# Patient Record
Sex: Male | Born: 1980
Health system: Southern US, Community
[De-identification: ages and names within clinical notes are randomized; demographics above are authoritative.]

## PROBLEM LIST (undated history)

## (undated) DIAGNOSIS — K219 Gastro-esophageal reflux disease without esophagitis: Secondary | ICD-10-CM

## (undated) DIAGNOSIS — G610 Guillain-Barre syndrome: Secondary | ICD-10-CM

## (undated) DIAGNOSIS — T300 Burn of unspecified body region, unspecified degree: Secondary | ICD-10-CM

## (undated) DIAGNOSIS — B20 Human immunodeficiency virus [HIV] disease: Secondary | ICD-10-CM

## (undated) HISTORY — PX: SKIN GRAFT: SHX250

## (undated) HISTORY — DX: Gastro-esophageal reflux disease without esophagitis: K21.9

## (undated) NOTE — *Deleted (*Deleted)
PROGRESS NOTE    Travis Mcgee  AVW:[id number] DOB: 1981/01/06 DOA: 07/28/2020 PCP: Patient, No Pcp Per   Brief Narrative:   ***   Assessment & Plan:  ***  DVT prophylaxis: *** Code Status: *** Family Communication: ***   Status is: Inpatient  {Inpatient:23812}  Dispo:  Patient From:    Planned Disposition: To be determined  Expected discharge date: 08/19/20  Medically stable for discharge:         Consultants:   ***  Procedures:   ***  Antimicrobials:  . ***   ROS:  *** . Remainder ROS is negative for all not previously mentioned.  Subjective: ***  Objective: Vitals:   08/17/20 1612 08/17/20 1930 08/17/20 2349 08/18/20 0743  BP: 123/80 113/79 116/71 108/74  Pulse: (!) 58 70 96 87  Resp: 16 18 17    Temp: 98.5 F (36.9 C) 98.6 F (37 C) 98.3 F (36.8 C) 98 F (36.7 C)  TempSrc: Oral Oral Oral Oral  SpO2: 95% 98% 100% 100%  Weight:      Height:        Intake/Output Summary (Last 24 hours) at 08/18/2020 1229 Last data filed at 08/18/2020 8295 Gross per 24 hour  Intake 360 ml  Output 600 ml  Net -240 ml   Filed Weights   07/28/20 1352 08/16/20 0308  Weight: 65.8 kg 71.7 kg    Examination:  General: 65 y.o. male resting in bed in NAD Eyes: PERRL, normal sclera ENMT: Nares patent w/o discharge, orophaynx clear, dentition normal, ears w/o discharge/lesions/ulcers Neck: Supple, trachea midline Cardiovascular: RRR, +S1, S2, no m/g/r, equal pulses throughout Respiratory: CTABL, no w/r/r, normal WOB GI: BS+, NDNT, no masses noted, no organomegaly noted MSK: No e/c/c Skin: No rashes, bruises, ulcerations noted Neuro: A&O x 3, no focal deficits Psyc: Appropriate interaction and affect, calm/cooperative   Data Reviewed: I have personally reviewed following labs and imaging studies.  CBC: Recent Labs  Lab 08/14/20 1124  WBC 8.4  NEUTROABS 3.1  HGB 12.8*  HCT 38.7*  MCV 105.7*  PLT 358   Basic Metabolic Panel: Recent Labs  Lab  08/14/20 1124  NA 136  K 3.5  CL 101  CO2 25  GLUCOSE 113*  BUN 9  CREATININE 0.71  CALCIUM 9.6   GFR: Estimated Creatinine Clearance: 124.5 mL/min (by C-G formula based on SCr of 0.71 mg/dL). Liver Function Tests: Recent Labs  Lab 08/14/20 1124  AST 37  ALT 50*  ALKPHOS 151*  BILITOT 0.4  PROT 9.9*  ALBUMIN 3.3*   No results for input(s): LIPASE, AMYLASE in the last 168 hours. No results for input(s): AMMONIA in the last 168 hours. Coagulation Profile: No results for input(s): INR, PROTIME in the last 168 hours. Cardiac Enzymes: No results for input(s): CKTOTAL, CKMB, CKMBINDEX, TROPONINI in the last 168 hours. BNP (last 3 results) No results for input(s): PROBNP in the last 8760 hours. HbA1C: No results for input(s): HGBA1C in the last 72 hours. CBG: No results for input(s): GLUCAP in the last 168 hours. Lipid Profile: No results for input(s): CHOL, HDL, LDLCALC, TRIG, CHOLHDL, LDLDIRECT in the last 72 hours. Thyroid Function Tests: No results for input(s): TSH, T4TOTAL, FREET4, T3FREE, THYROIDAB in the last 72 hours. Anemia Panel: No results for input(s): VITAMINB12, FOLATE, FERRITIN, TIBC, IRON, RETICCTPCT in the last 72 hours. Sepsis Labs: No results for input(s): PROCALCITON, LATICACIDVEN in the last 168 hours.  Recent Results (from the past 240 hour(s))  Aerobic/Anaerobic Culture (surgical/deep wound)  Status: None (Preliminary result)   Collection Time: 08/14/20  4:08 PM   Specimen: Wound; Abscess  Result Value Ref Range Status   Specimen Description WOUND LEFT AXILLA  Final   Special Requests Immunocompromised  Final   Gram Stain   Final    ABUNDANT WBC PRESENT, PREDOMINANTLY PMN FEW GRAM POSITIVE COCCI Performed at [facility], [address], [place], [place] [address]    Culture   Final    MODERATE STAPHYLOCOCCUS LUGDUNENSIS NO ANAEROBES ISOLATED; CULTURE IN PROGRESS FOR 5 DAYS    Report Status PENDING  Incomplete   Organism ID,  Bacteria STAPHYLOCOCCUS LUGDUNENSIS  Final      Susceptibility   Staphylococcus lugdunensis - MIC*    CIPROFLOXACIN <=0.5 SENSITIVE Sensitive     ERYTHROMYCIN >=8 RESISTANT Resistant     GENTAMICIN <=0.5 SENSITIVE Sensitive     OXACILLIN 0.5 SENSITIVE Sensitive     TETRACYCLINE <=1 SENSITIVE Sensitive     VANCOMYCIN <=0.5 SENSITIVE Sensitive     TRIMETH/SULFA <=10 SENSITIVE Sensitive     CLINDAMYCIN >=8 RESISTANT Resistant     RIFAMPIN <=0.5 SENSITIVE Sensitive     Inducible Clindamycin NEGATIVE Sensitive     * MODERATE STAPHYLOCOCCUS LUGDUNENSIS      Radiology Studies: No results found.   Scheduled Meds: . amoxicillin-clavulanate  1 tablet Oral Q12H  . bictegravir-emtricitabine-tenofovir AF  1 tablet Oral Daily  . DULoxetine  20 mg Oral Daily  . enoxaparin (LOVENOX) injection  40 mg Subcutaneous Q24H  . feeding supplement  237 mL Oral Q24H  . gabapentin  900 mg Oral TID  . levothyroxine  25 mcg Oral Q0600  . methocarbamol  500 mg Oral TID  . multivitamin with minerals  1 tablet Oral Daily  . sulfamethoxazole-trimethoprim  1 tablet Oral Daily  . thiamine injection  100 mg Intravenous Daily   Or  . thiamine  100 mg Oral Daily  . vitamin B-12  1,000 mcg Oral Daily   Continuous Infusions:   LOS: 21 days    Time spent: 35 minutes spent in coordination of care today.    Teddy Spike, DO [facility]  If 7PM-7AM, please contact night-coverage [email or web address] 08/18/2020, 12:29 PM

---

## 2017-01-07 ENCOUNTER — Encounter (HOSPITAL_COMMUNITY): Payer: Self-pay | Admitting: Emergency Medicine

## 2017-01-07 ENCOUNTER — Emergency Department (HOSPITAL_COMMUNITY)
Admission: EM | Admit: 2017-01-07 | Discharge: 2017-01-07 | Disposition: A | Discharge status: Home / Self Care | Payer: Self-pay | Attending: Emergency Medicine | Admitting: Emergency Medicine

## 2017-01-07 DIAGNOSIS — F17210 Nicotine dependence, cigarettes, uncomplicated: Secondary | ICD-10-CM | POA: Insufficient documentation

## 2017-01-07 DIAGNOSIS — N3000 Acute cystitis without hematuria: Secondary | ICD-10-CM | POA: Insufficient documentation

## 2017-01-07 DIAGNOSIS — Z202 Contact with and (suspected) exposure to infections with a predominantly sexual mode of transmission: Secondary | ICD-10-CM | POA: Insufficient documentation

## 2017-01-07 DIAGNOSIS — K644 Residual hemorrhoidal skin tags: Secondary | ICD-10-CM | POA: Insufficient documentation

## 2017-01-07 DIAGNOSIS — Z79899 Other long term (current) drug therapy: Secondary | ICD-10-CM | POA: Insufficient documentation

## 2017-01-07 HISTORY — DX: Burn of unspecified body region, unspecified degree: T30.0

## 2017-01-07 HISTORY — DX: Human immunodeficiency virus (HIV) disease: B20

## 2017-01-07 LAB — URINALYSIS, ROUTINE W REFLEX MICROSCOPIC
BACTERIA UA: NONE SEEN
Bilirubin Urine: NEGATIVE
Glucose, UA: NEGATIVE mg/dL
HGB URINE DIPSTICK: NEGATIVE
Ketones, ur: 5 mg/dL — AB
NITRITE: NEGATIVE
PH: 6 (ref 5.0–8.0)
Protein, ur: 100 mg/dL — AB
SPECIFIC GRAVITY, URINE: 1.03 (ref 1.005–1.030)
Squamous Epithelial / HPF: NONE SEEN

## 2017-01-07 MED ORDER — HYDROCORTISONE 2.5 % RE CREA: TOPICAL_CREAM | RECTAL | 0 refills | Status: DC

## 2017-01-07 MED ORDER — CEPHALEXIN 500 MG PO CAPS: 500.0000 mg | ORAL_CAPSULE | Freq: Two times a day (BID) | ORAL | 0 refills | Status: AC

## 2017-01-07 MED ORDER — STERILE WATER FOR INJECTION IJ SOLN
INTRAMUSCULAR | Status: AC
  Administered 2017-01-07: 10 mL
  Filled 2017-01-07: qty 10

## 2017-01-07 MED ORDER — METRONIDAZOLE 500 MG PO TABS
2000.0000 mg | ORAL_TABLET | Freq: Once | ORAL | Status: AC
  Administered 2017-01-07: 2000 mg via ORAL
  Filled 2017-01-07: qty 4

## 2017-01-07 MED ORDER — DOXYCYCLINE HYCLATE 100 MG PO CAPS: 100.0000 mg | ORAL_CAPSULE | Freq: Two times a day (BID) | ORAL | 0 refills | Status: DC

## 2017-01-07 MED ORDER — AZITHROMYCIN 250 MG PO TABS
1000.0000 mg | ORAL_TABLET | Freq: Once | ORAL | Status: AC
  Administered 2017-01-07: 1000 mg via ORAL
  Filled 2017-01-07: qty 4

## 2017-01-07 MED ORDER — CEFTRIAXONE SODIUM 250 MG IJ SOLR
250.0000 mg | Freq: Once | INTRAMUSCULAR | Status: AC
  Administered 2017-01-07: 250 mg via INTRAMUSCULAR
  Filled 2017-01-07: qty 250
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
## 2017-01-07 NOTE — Discharge Instructions (Signed)
Please take Doxycycline 2 times a day for 10 days. Please take Keflex 2 times a day for 7 days. Use anusol as needed twice daily. Continue sitz baths at home. Also high fiber diet and metamucil over the counter.  Make sure to go to HIV clinic to be followed and started on HIV medication. This is very important for both you and your partner.  Follow up with general surgery to have hemorrhoid evaluated.    Contact a health care provider if: See your health care provider. Tell your sexual partner(s). They should be tested and treated for any STDs. Do not have sex until your health care provider says it is okay. Get help right away if: Contact your health care provider right away if: You have severe abdominal pain. You are a man and notice swelling or pain in your testicles.

## 2017-01-07 NOTE — ED Notes (Signed)
Pt presents with a yellow penile discharge that started a few days ago.  Also complains of rectal pain due to an external hemorrhoid.  Denies bleeding. Denies any other complaints.

## 2017-01-08 LAB — HIV 1/2 AB DIFFERENTIATION
HIV 1 AB: POSITIVE — AB
HIV 2 AB: UNDETERMINED

## 2017-01-08 LAB — RPR, QUANT+TP ABS (REFLEX)
Rapid Plasma Reagin, Quant: 1:4 {titer} — ABNORMAL HIGH
T Pallidum Abs: POSITIVE — AB

## 2017-01-08 LAB — HIV ANTIBODY (ROUTINE TESTING W REFLEX): HIV Screen 4th Generation wRfx: REACTIVE — AB

## 2017-01-08 LAB — RPR: RPR: REACTIVE — AB

## 2017-01-09 LAB — URINE CULTURE: CULTURE: NO GROWTH

## 2017-01-10 LAB — GC/CHLAMYDIA PROBE AMP (~~LOC~~) NOT AT ARMC
CHLAMYDIA, DNA PROBE: NEGATIVE
Neisseria Gonorrhea: POSITIVE — AB

## 2017-06-12 ENCOUNTER — Ambulatory Visit: Payer: No Typology Code available for payment source

## 2017-06-12 ENCOUNTER — Ambulatory Visit: Payer: No Typology Code available for payment source | Admitting: Infectious Diseases

## 2017-06-12 ENCOUNTER — Other Ambulatory Visit: Payer: No Typology Code available for payment source

## 2017-07-17 ENCOUNTER — Ambulatory Visit: Payer: No Typology Code available for payment source | Admitting: Infectious Diseases

## 2017-11-27 ENCOUNTER — Other Ambulatory Visit: Payer: No Typology Code available for payment source

## 2017-11-27 ENCOUNTER — Other Ambulatory Visit: Payer: Self-pay | Admitting: *Deleted

## 2017-11-27 DIAGNOSIS — B20 Human immunodeficiency virus [HIV] disease: Secondary | ICD-10-CM

## 2017-11-27 DIAGNOSIS — Z113 Encounter for screening for infections with a predominantly sexual mode of transmission: Secondary | ICD-10-CM

## 2017-11-27 DIAGNOSIS — Z79899 Other long term (current) drug therapy: Secondary | ICD-10-CM

## 2017-11-29 ENCOUNTER — Other Ambulatory Visit: Payer: Self-pay

## 2017-11-29 ENCOUNTER — Encounter (HOSPITAL_COMMUNITY): Payer: Self-pay | Admitting: Emergency Medicine

## 2017-11-29 ENCOUNTER — Inpatient Hospital Stay (HOSPITAL_COMMUNITY)
Admission: EM | Admit: 2017-11-29 | Discharge: 2017-12-01 | DRG: 977 | Disposition: A | Discharge status: Home / Self Care | Payer: Non-veteran care | Attending: Internal Medicine | Admitting: Internal Medicine

## 2017-11-29 ENCOUNTER — Emergency Department (HOSPITAL_COMMUNITY): Payer: Non-veteran care

## 2017-11-29 DIAGNOSIS — Z87828 Personal history of other (healed) physical injury and trauma: Secondary | ICD-10-CM

## 2017-11-29 DIAGNOSIS — Z79899 Other long term (current) drug therapy: Secondary | ICD-10-CM

## 2017-11-29 DIAGNOSIS — Z8619 Personal history of other infectious and parasitic diseases: Secondary | ICD-10-CM | POA: Diagnosis present

## 2017-11-29 DIAGNOSIS — E43 Unspecified severe protein-calorie malnutrition: Secondary | ICD-10-CM | POA: Diagnosis present

## 2017-11-29 DIAGNOSIS — B20 Human immunodeficiency virus [HIV] disease: Secondary | ICD-10-CM | POA: Diagnosis not present

## 2017-11-29 DIAGNOSIS — R634 Abnormal weight loss: Secondary | ICD-10-CM | POA: Diagnosis present

## 2017-11-29 DIAGNOSIS — R933 Abnormal findings on diagnostic imaging of other parts of digestive tract: Secondary | ICD-10-CM | POA: Diagnosis present

## 2017-11-29 DIAGNOSIS — K2970 Gastritis, unspecified, without bleeding: Secondary | ICD-10-CM | POA: Diagnosis present

## 2017-11-29 DIAGNOSIS — Z681 Body mass index (BMI) 19 or less, adult: Secondary | ICD-10-CM

## 2017-11-29 DIAGNOSIS — R1310 Dysphagia, unspecified: Secondary | ICD-10-CM

## 2017-11-29 DIAGNOSIS — R0902 Hypoxemia: Secondary | ICD-10-CM | POA: Diagnosis not present

## 2017-11-29 DIAGNOSIS — K228 Other specified diseases of esophagus: Secondary | ICD-10-CM | POA: Diagnosis present

## 2017-11-29 DIAGNOSIS — K449 Diaphragmatic hernia without obstruction or gangrene: Secondary | ICD-10-CM | POA: Diagnosis present

## 2017-11-29 DIAGNOSIS — K089 Disorder of teeth and supporting structures, unspecified: Secondary | ICD-10-CM

## 2017-11-29 DIAGNOSIS — D649 Anemia, unspecified: Secondary | ICD-10-CM | POA: Diagnosis present

## 2017-11-29 DIAGNOSIS — F17210 Nicotine dependence, cigarettes, uncomplicated: Secondary | ICD-10-CM | POA: Diagnosis present

## 2017-11-29 DIAGNOSIS — Z9114 Patient's other noncompliance with medication regimen: Secondary | ICD-10-CM

## 2017-11-29 DIAGNOSIS — K3189 Other diseases of stomach and duodenum: Secondary | ICD-10-CM | POA: Diagnosis present

## 2017-11-29 DIAGNOSIS — R000 Tachycardia, unspecified: Secondary | ICD-10-CM | POA: Diagnosis present

## 2017-11-29 DIAGNOSIS — E871 Hypo-osmolality and hyponatremia: Secondary | ICD-10-CM | POA: Diagnosis present

## 2017-11-29 LAB — URINALYSIS, ROUTINE W REFLEX MICROSCOPIC
BACTERIA UA: NONE SEEN
Glucose, UA: NEGATIVE mg/dL
Hgb urine dipstick: NEGATIVE
Ketones, ur: NEGATIVE mg/dL
Leukocytes, UA: NEGATIVE
Nitrite: NEGATIVE
PH: 5 (ref 5.0–8.0)
Protein, ur: 100 mg/dL — AB
SPECIFIC GRAVITY, URINE: 1.03 (ref 1.005–1.030)

## 2017-11-29 LAB — CBC WITH DIFFERENTIAL/PLATELET
BASOS ABS: 0 10*3/uL (ref 0.0–0.1)
Basophils Relative: 0 %
EOS PCT: 1 %
Eosinophils Absolute: 0.1 10*3/uL (ref 0.0–0.7)
HCT: 38.8 % — ABNORMAL LOW (ref 39.0–52.0)
Hemoglobin: 13.5 g/dL (ref 13.0–17.0)
LYMPHS ABS: 0.9 10*3/uL (ref 0.7–4.0)
LYMPHS PCT: 17 %
MCH: 33.1 pg (ref 26.0–34.0)
MCHC: 34.8 g/dL (ref 30.0–36.0)
MCV: 95.1 fL (ref 78.0–100.0)
Monocytes Absolute: 0.8 10*3/uL (ref 0.1–1.0)
Monocytes Relative: 15 %
Neutro Abs: 3.3 10*3/uL (ref 1.7–7.7)
Neutrophils Relative %: 67 %
PLATELETS: 223 10*3/uL (ref 150–400)
RBC: 4.08 MIL/uL — ABNORMAL LOW (ref 4.22–5.81)
RDW: 12.2 % (ref 11.5–15.5)
WBC: 5 10*3/uL (ref 4.0–10.5)

## 2017-11-29 LAB — COMPREHENSIVE METABOLIC PANEL
ALBUMIN: 2.9 g/dL — AB (ref 3.5–5.0)
ALK PHOS: 120 U/L (ref 38–126)
ALT: 19 U/L (ref 17–63)
AST: 35 U/L (ref 15–41)
Anion gap: 7 (ref 5–15)
BILIRUBIN TOTAL: 0.5 mg/dL (ref 0.3–1.2)
BUN: 8 mg/dL (ref 6–20)
CALCIUM: 8.5 mg/dL — AB (ref 8.9–10.3)
CO2: 29 mmol/L (ref 22–32)
Chloride: 95 mmol/L — ABNORMAL LOW (ref 101–111)
Creatinine, Ser: 0.82 mg/dL (ref 0.61–1.24)
GFR calc Af Amer: >60 mL/min (ref >60)
GFR calc non Af Amer: >60 mL/min (ref >60)
GLUCOSE: 91 mg/dL (ref 65–99)
POTASSIUM: 3.6 mmol/L (ref 3.5–5.1)
SODIUM: 131 mmol/L — AB (ref 135–145)
TOTAL PROTEIN: 8.8 g/dL — AB (ref 6.5–8.1)

## 2017-11-29 LAB — PHOSPHORUS: Phosphorus: 3.1 mg/dL (ref 2.5–4.6)

## 2017-11-29 LAB — MAGNESIUM: Magnesium: 2.1 mg/dL (ref 1.7–2.4)

## 2017-11-29 LAB — LACTATE DEHYDROGENASE: LDH: 159 U/L (ref 98–192)

## 2017-11-29 IMAGING — CT CT ANGIO CHEST
3 of 10 series · 17 of 37 positions shown · IV contrast (ISOVUE)
Comparison: None.

CLINICAL DATA: Increased difficulty swallowing over 3 weeks. Unable
to swallow food.

EXAM:
CT ANGIOGRAPHY CHEST WITH CONTRAST
TECHNIQUE: Multidetector CT imaging of the chest was performed using the
standard protocol during bolus administration of intravenous
contrast. Multiplanar CT image reconstructions and MIPs were
obtained to evaluate the vascular anatomy.
CONTRAST:  100mL [34] IOPAMIDOL ([34]) INJECTION 76%

[Series 5: thins · axial · 0.62mm/px · z∈[-520,-240]mm · 11 of 336 slices shown]
[im 28/336  lung]
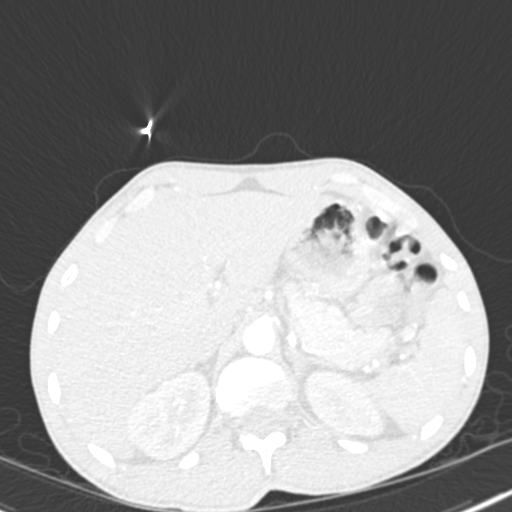
[im 56/336  mediastinal]
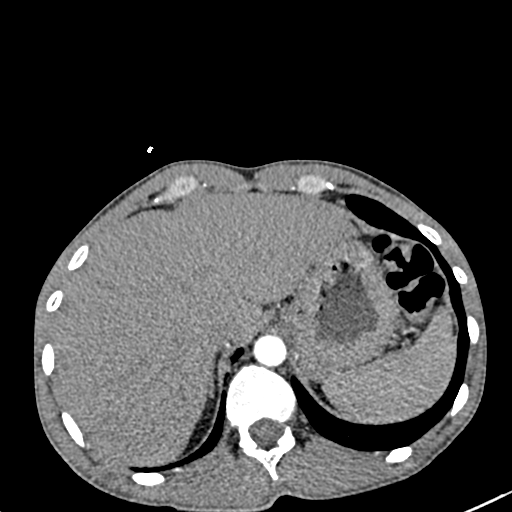
[im 84/336  lung]
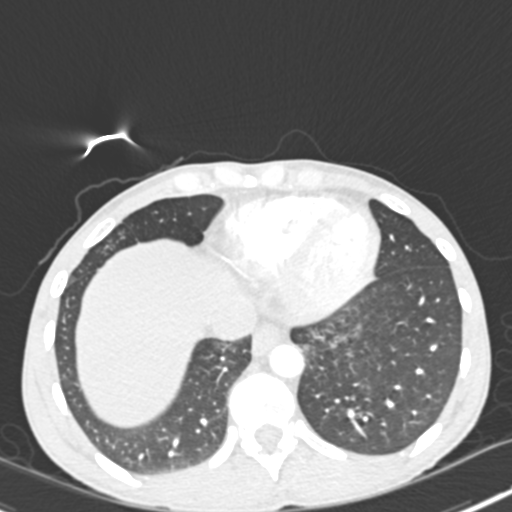
[im 112/336  mediastinal]
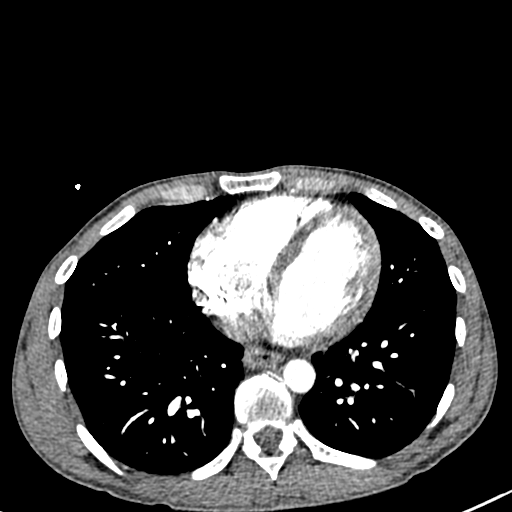
[im 140/336  lung]
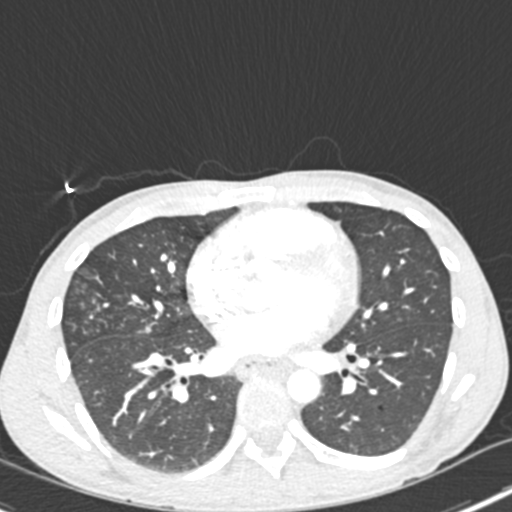
[im 168/336  mediastinal]
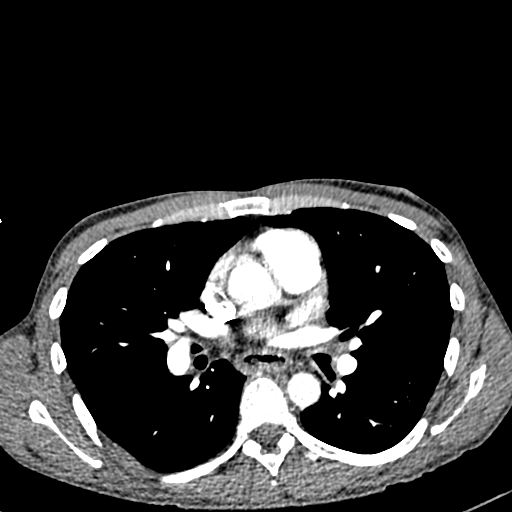
[im 196/336  lung]
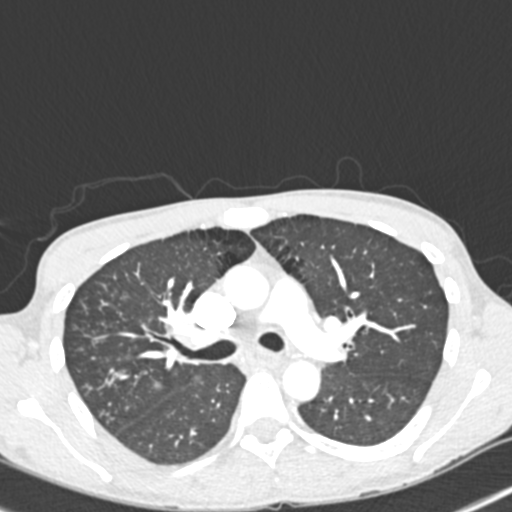
[im 224/336  mediastinal]
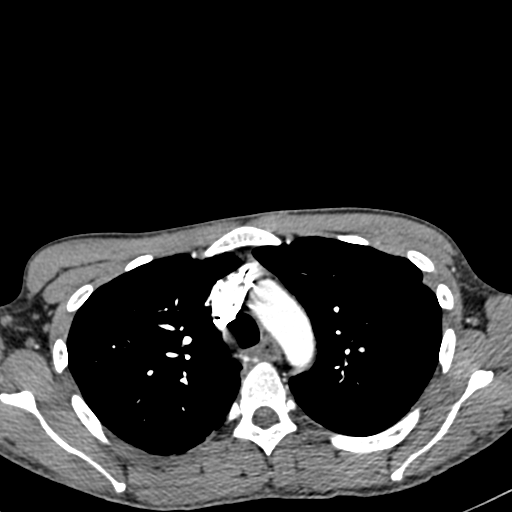
[im 252/336  lung]
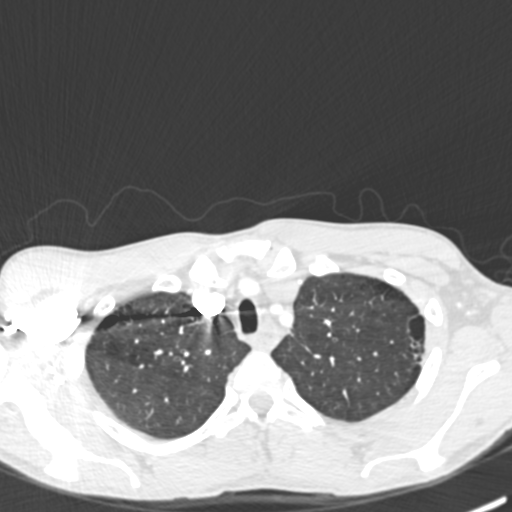
[im 280/336  mediastinal]
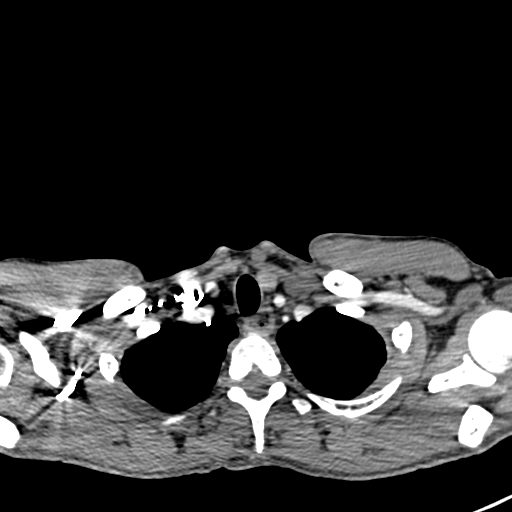
[im 308/336  lung]
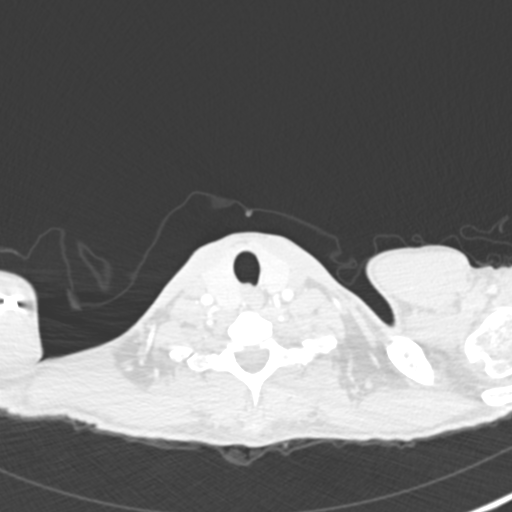

[Series 11: axial neck · axial · 0.44mm/px · z∈[-266,-130]mm · 3 of 137 slices shown]
[im 35/137  lung]
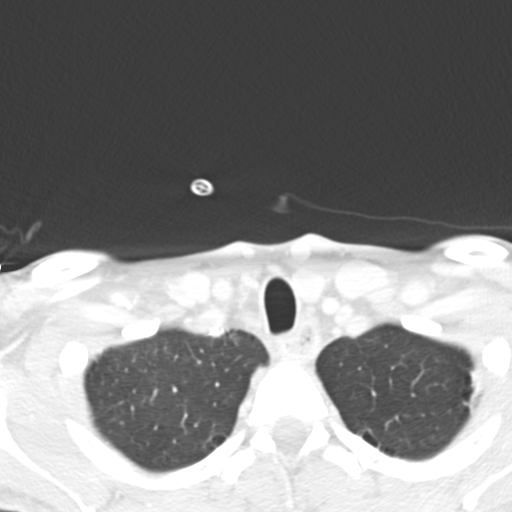
[im 69/137  lung]
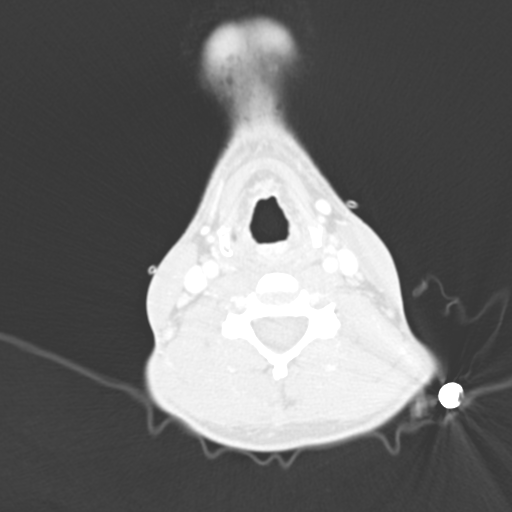
[im 103/137  lung]
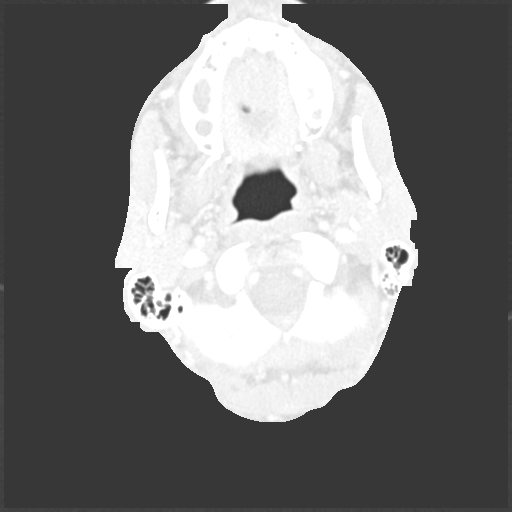

[Series 15: orthogonal ax · axial · 0.39mm/px · z∈[-287,-150]mm · 3 of 142 slices shown]
[im 36/142  lung]
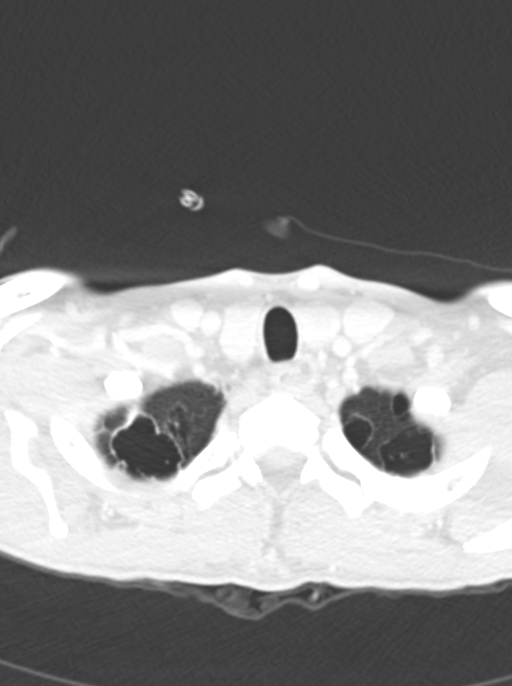
[im 71/142  lung]
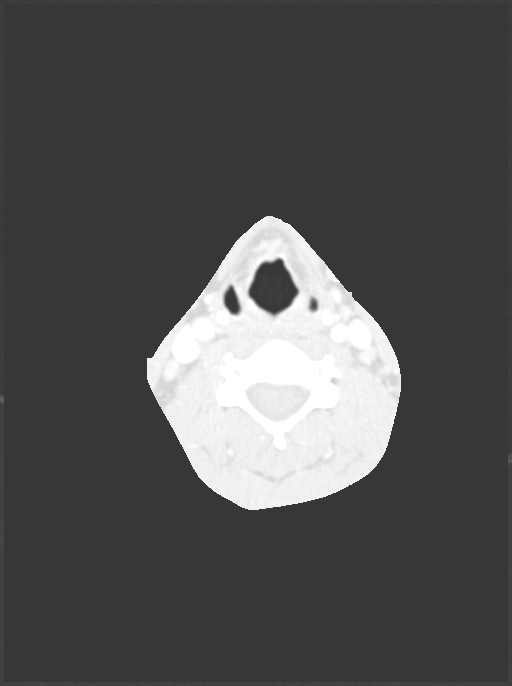
[im 106/142  lung]
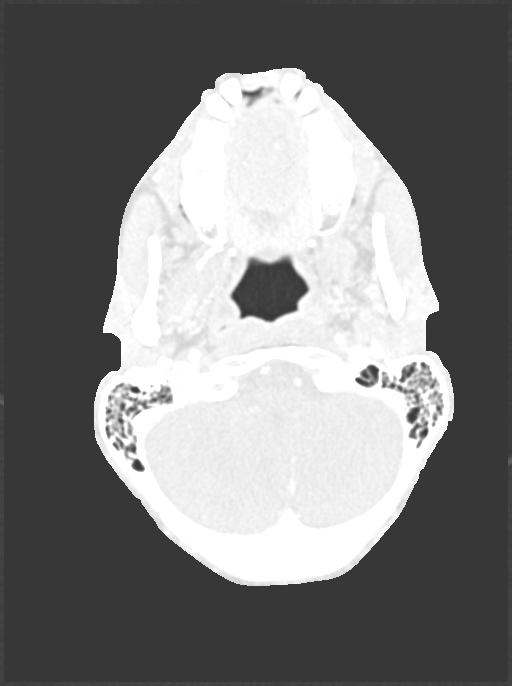

[17 of 37 positions shown; findings below may reference images not displayed]

FINDINGS: Cardiovascular: Satisfactory opacification of the pulmonary arteries
to the segmental level. No evidence of pulmonary embolism. Normal
heart size. No pericardial effusion. Thoracic aorta is normal in
caliber. No thoracic aortic dissection.

Mediastinum/Nodes: No enlarged mediastinal, hilar, or axillary lymph
nodes. Thyroid gland and trachea demonstrate no significant
findings. Diffuse esophageal wall thickening with fluid mildly
distending the esophagus as can be seen with esophagitis with
possible distal esophageal stricture.

Lungs/Pleura: No pleural effusion or pneumothorax. Multiple
ground-glass pulmonary nodules and reticular thickening in the right
upper lobe, right middle lobe and left lower lobe concerning for an
infectious or inflammatory etiology including atypical infection
such as FERIENHAUS. Multiple smaller blebs along the left lateral chest
wall.

Upper Abdomen: No acute upper abdominal abnormality.

Musculoskeletal: No acute osseous abnormality.

Review of the MIP images confirms the above findings.
IMPRESSION: 1. No evidence pulmonary embolus.
2. Diffuse esophageal wall thickening with fluid mildly distending
the esophagus as can be seen with esophagitis with possible distal
esophageal stricture.
3. Normal thoracic aorta without dissection.
4. Multiple ground-glass pulmonary nodules and reticular thickening
in the right upper lobe, right middle lobe and left lower lobe
concerning for an infectious or inflammatory etiology including
atypical infection such as FERIENHAUS.

## 2017-11-29 IMAGING — CR DG CHEST 2V
2 series · 2 of 2 positions shown · non-contrast
Comparison: None.

CLINICAL DATA: Diarrhea for 3 weeks, decreased appetite

EXAM:
CHEST  2 VIEW

[w chest pa]
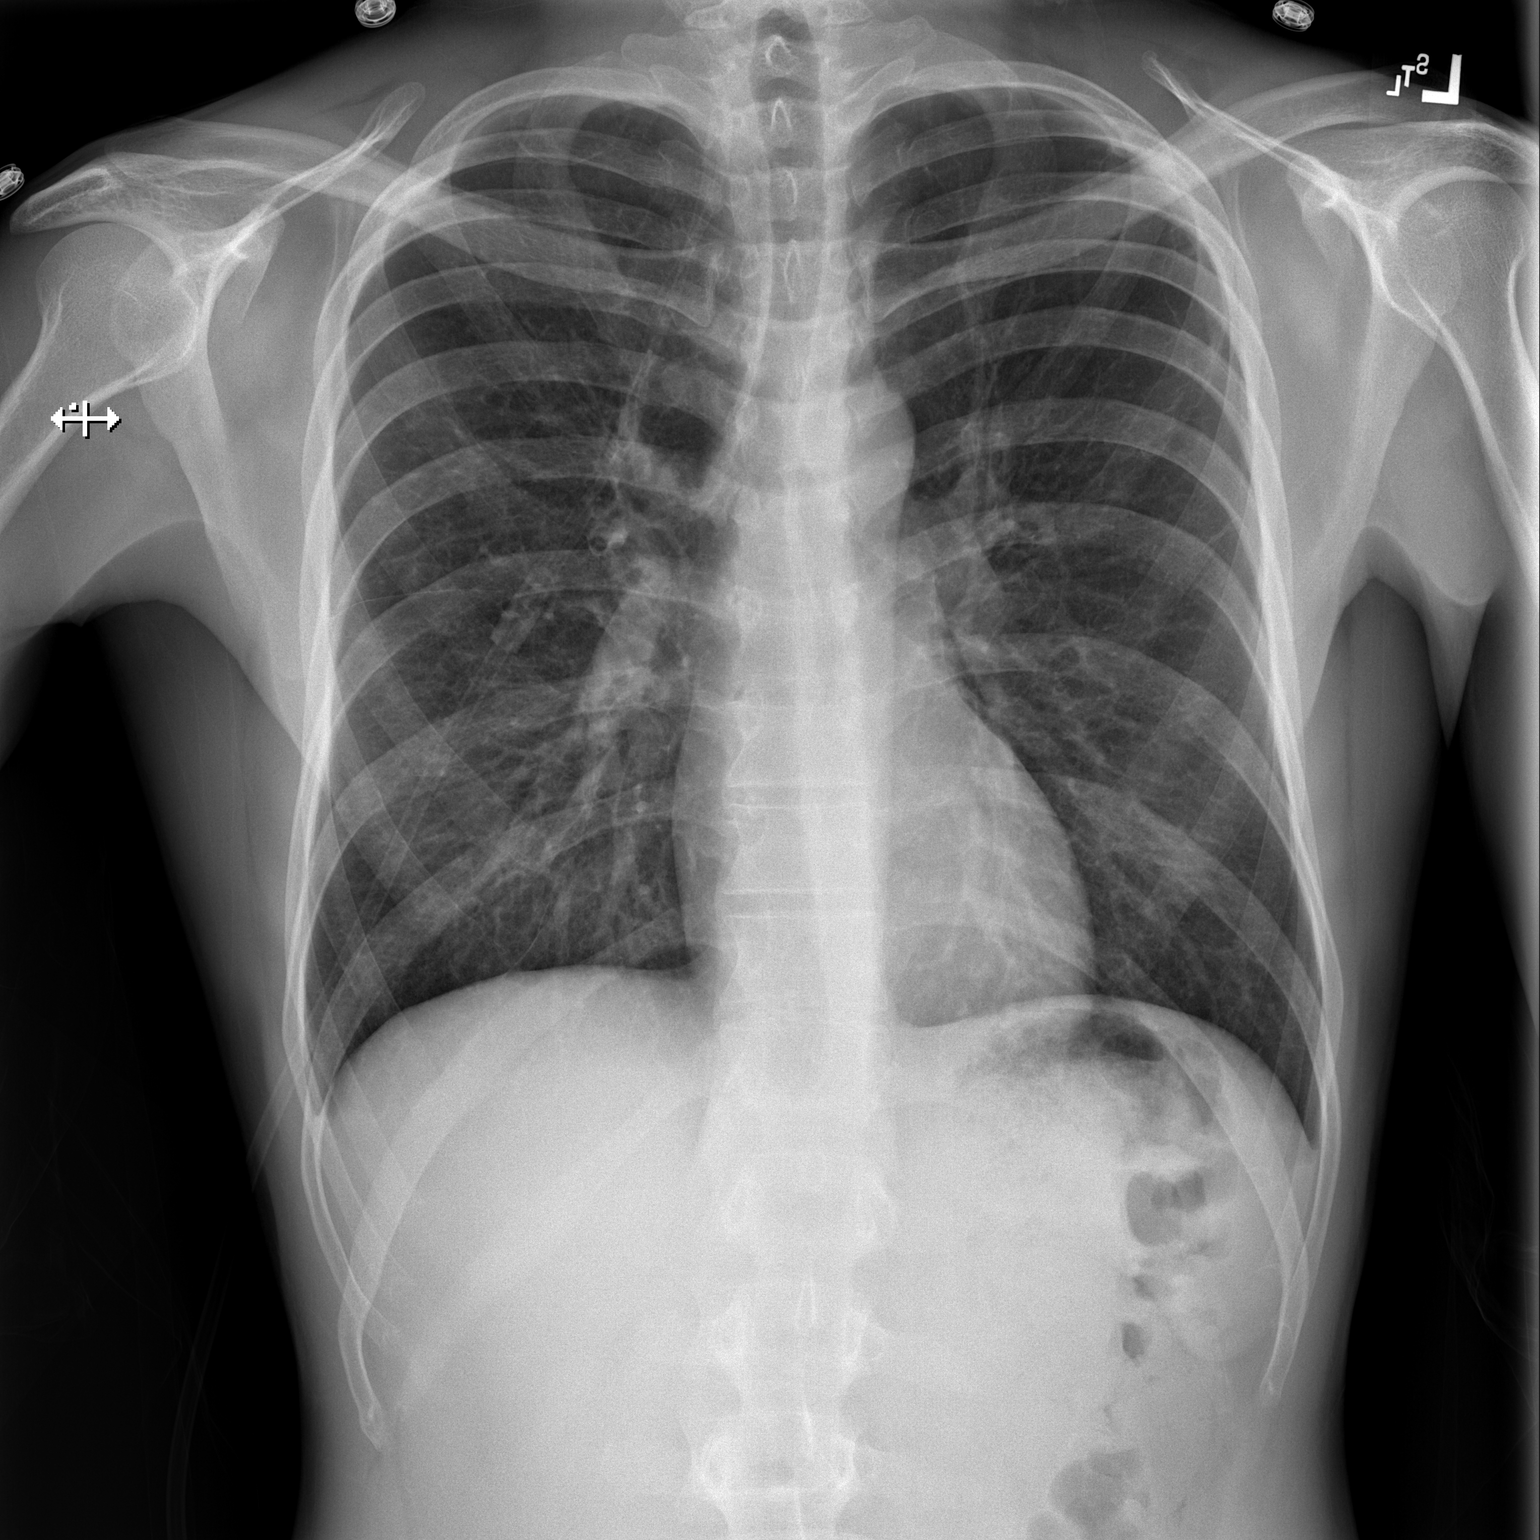

[w chest lat]
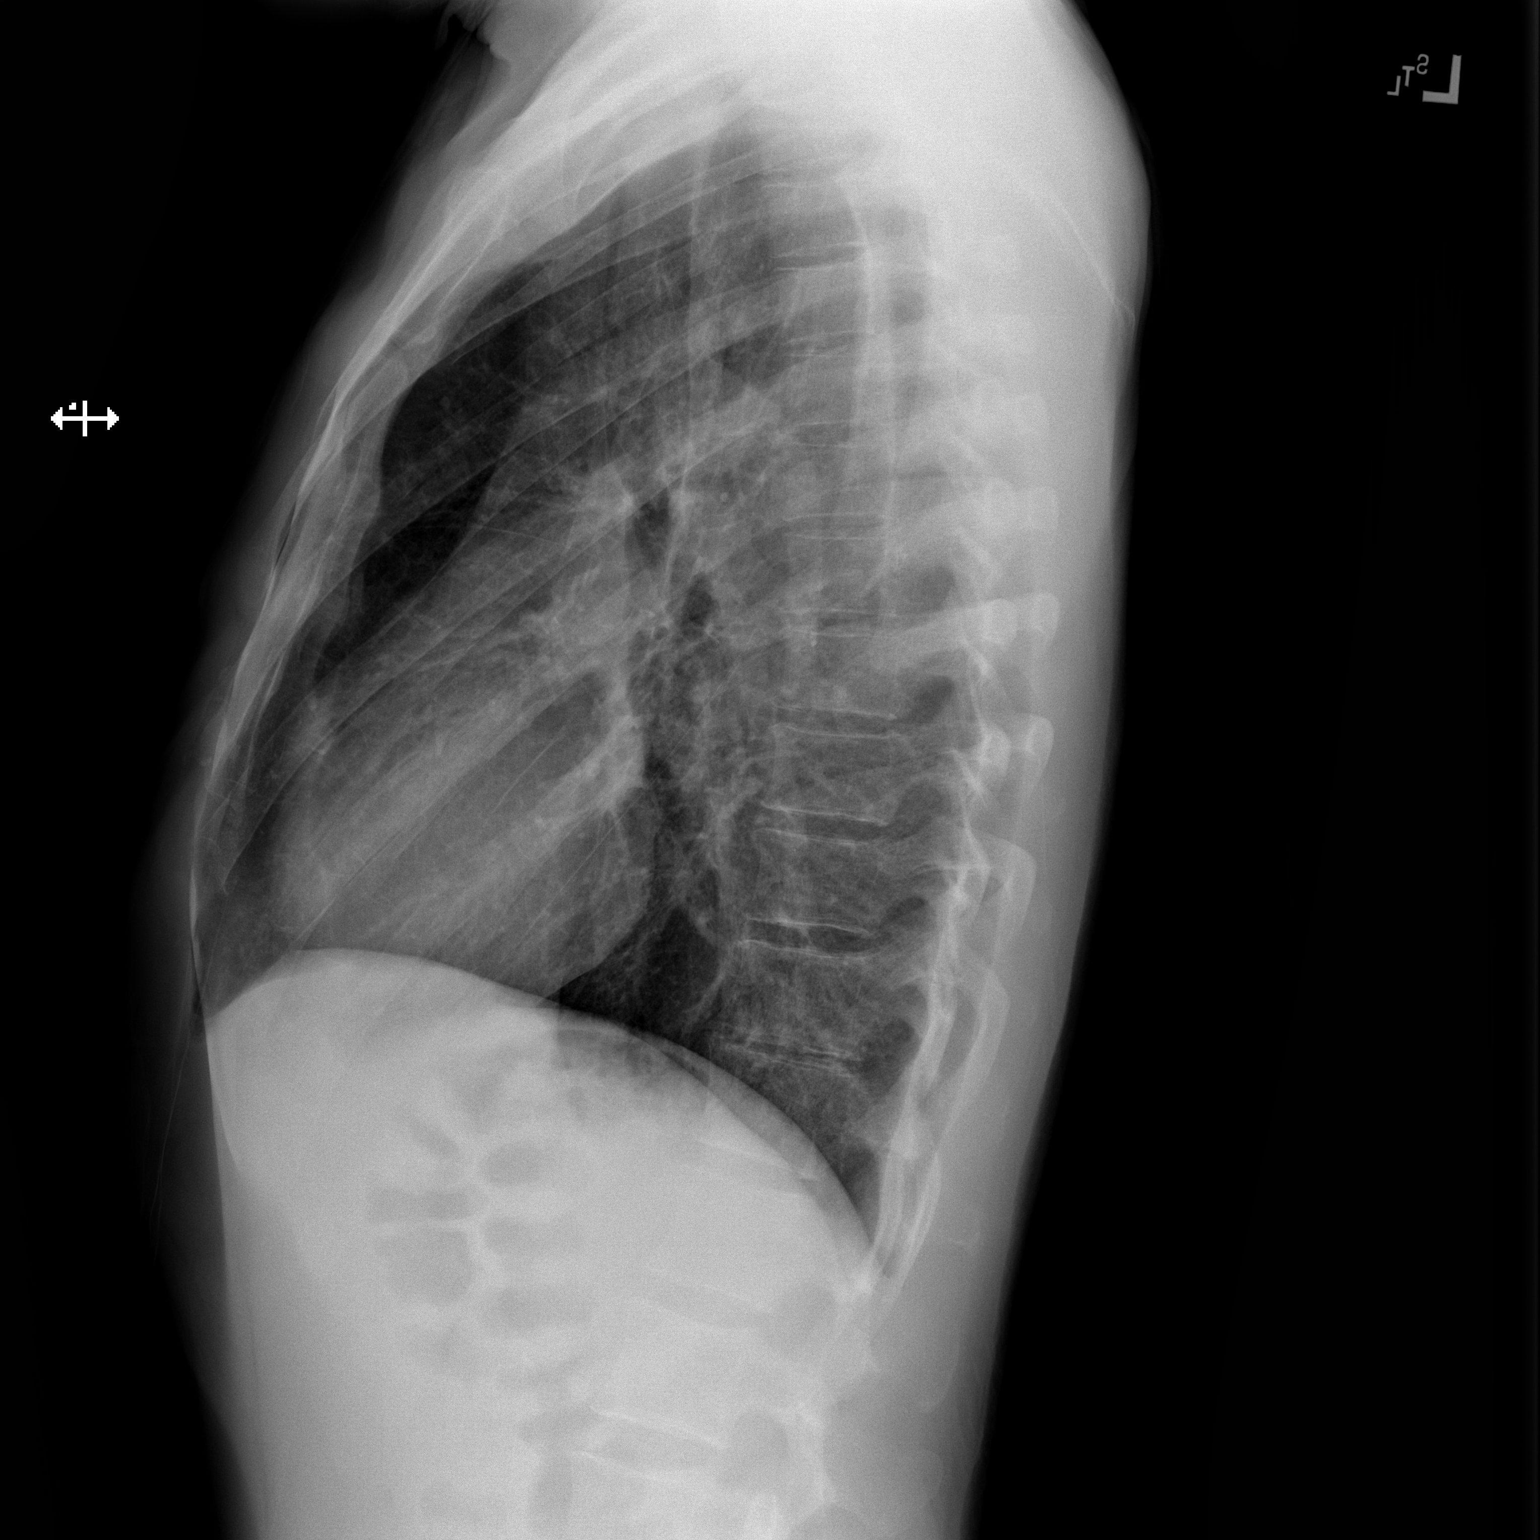

[2 of 2 positions shown; findings below may reference images not displayed]

FINDINGS: The heart size and mediastinal contours are within normal limits.
Both lungs are clear. The visualized skeletal structures are
unremarkable.
IMPRESSION: No active cardiopulmonary disease.

## 2017-11-29 MED ORDER — ENSURE ENLIVE PO LIQD
237.0000 mL | Freq: Two times a day (BID) | ORAL | Status: DC
  Administered 2017-11-30: 237 mL via ORAL

## 2017-11-29 MED ORDER — FLUCONAZOLE 40 MG/ML PO SUSR
200.0000 mg | Freq: Once | ORAL | Status: AC
  Administered 2017-11-29: 200 mg via ORAL
  Filled 2017-11-29 (×2): qty 5

## 2017-11-29 MED ORDER — ONDANSETRON HCL 4 MG PO TABS: 4.0000 mg | ORAL_TABLET | Freq: Four times a day (QID) | ORAL | Status: DC | PRN

## 2017-11-29 MED ORDER — SODIUM CHLORIDE 0.9 % IV SOLN
INTRAVENOUS | Status: DC
  Administered 2017-11-29: 22:00:00 via INTRAVENOUS

## 2017-11-29 MED ORDER — FLUCONAZOLE 40 MG/ML PO SUSR
100.0000 mg | Freq: Every day | ORAL | Status: DC
  Administered 2017-11-30: 100 mg via ORAL
  Filled 2017-11-29 (×2): qty 2.5

## 2017-11-29 MED ORDER — SODIUM CHLORIDE 0.9 % IV BOLUS (SEPSIS)
1000.0000 mL | Freq: Once | INTRAVENOUS | Status: AC
  Administered 2017-11-29: 1000 mL via INTRAVENOUS

## 2017-11-29 MED ORDER — RANITIDINE HCL 150 MG/10ML PO SYRP
150.0000 mg | ORAL_SOLUTION | Freq: Two times a day (BID) | ORAL | Status: DC
  Administered 2017-11-29 – 2017-11-30 (×2): 150 mg via ORAL
  Filled 2017-11-29 (×2): qty 10

## 2017-11-29 MED ORDER — IOPAMIDOL (ISOVUE-370) INJECTION 76%
INTRAVENOUS | Status: AC
  Filled 2017-11-29: qty 100

## 2017-11-29 MED ORDER — ONDANSETRON HCL 4 MG/2ML IJ SOLN
4.0000 mg | Freq: Four times a day (QID) | INTRAMUSCULAR | Status: DC | PRN
Start: 2017-11-29 — End: 2017-12-01

## 2017-11-29 MED ORDER — MAGIC MOUTHWASH W/LIDOCAINE
10.0000 mL | Freq: Four times a day (QID) | ORAL | Status: DC
  Administered 2017-11-29 – 2017-11-30 (×5): 10 mL via ORAL
  Filled 2017-11-29 (×9): qty 10

## 2017-11-29 MED ORDER — IOPAMIDOL (ISOVUE-370) INJECTION 76%
100.0000 mL | Freq: Once | INTRAVENOUS | Status: AC | PRN
  Administered 2017-11-29: 100 mL via INTRAVENOUS

## 2017-11-29 MED ORDER — ZOLPIDEM TARTRATE 5 MG PO TABS
5.0000 mg | ORAL_TABLET | Freq: Once | ORAL | Status: AC
  Administered 2017-11-29: 5 mg via ORAL
  Filled 2017-11-29: qty 1

## 2017-11-29 MED ORDER — ENOXAPARIN SODIUM 40 MG/0.4ML ~~LOC~~ SOLN
40.0000 mg | Freq: Every day | SUBCUTANEOUS | Status: DC
  Administered 2017-11-29 – 2017-11-30 (×2): 40 mg via SUBCUTANEOUS
  Filled 2017-11-29 (×2): qty 0.4

## 2017-11-29 MED ORDER — SODIUM CHLORIDE 0.9 % IV SOLN
Freq: Once | INTRAVENOUS | Status: AC
Start: 2017-11-29 — End: 2017-11-29
  Administered 2017-11-29: 17:00:00 via INTRAVENOUS

## 2017-11-29 NOTE — ED Notes (Signed)
Patient transported to CT 

## 2017-11-29 NOTE — ED Notes (Signed)
Patient provided with urinal and made aware urine sample is needed. 

## 2017-11-29 NOTE — ED Notes (Signed)
Patient given sprite.

## 2017-11-29 NOTE — ED Notes (Signed)
Pt tolerated ambulation well with 02 stat 100.

## 2017-11-29 NOTE — ED Notes (Signed)
Patient transported to X-ray 

## 2017-11-29 NOTE — ED Notes (Signed)
Hospitalist at bedside 

## 2017-11-29 NOTE — ED Triage Notes (Addendum)
Patient c/o decreased appetite and diarrhea x3 weeks. States "I haven't eaten in three weeks." Denies N/V. Also c/o sore throat, cough, and congestion. Patient reports he is homeless. Patient adds he is noncompliant with HIV meds.
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
## 2017-11-29 NOTE — ED Notes (Signed)
ED Provider at bedside. 

## 2017-11-29 NOTE — ED Provider Notes (Signed)
Patient seen after signout from mid-level provider.  Patient is presenting with chief complaint of dysphagia.  Patient is a long-standing history of HIV and is currently off any medications for that.  Initial vitals suggested mild hypoxia with room air sat of 88%.  Evaluation - including CTA neck and chest - suggest esophageal stricture and/or thickening with possible concurrent pulmonary MAI.  Case discussed with admitting tried hospitalist service and they will evaluate for admission.     Wynetta FinesMessick, Peter C, MD 11/29/17 [id number]

## 2017-11-30 DIAGNOSIS — R093 Abnormal sputum: Secondary | ICD-10-CM

## 2017-11-30 DIAGNOSIS — R05 Cough: Secondary | ICD-10-CM

## 2017-11-30 DIAGNOSIS — Z21 Asymptomatic human immunodeficiency virus [HIV] infection status: Secondary | ICD-10-CM | POA: Diagnosis not present

## 2017-11-30 DIAGNOSIS — E43 Unspecified severe protein-calorie malnutrition: Secondary | ICD-10-CM | POA: Diagnosis present

## 2017-11-30 DIAGNOSIS — F329 Major depressive disorder, single episode, unspecified: Secondary | ICD-10-CM

## 2017-11-30 DIAGNOSIS — K3189 Other diseases of stomach and duodenum: Secondary | ICD-10-CM | POA: Diagnosis present

## 2017-11-30 DIAGNOSIS — K089 Disorder of teeth and supporting structures, unspecified: Secondary | ICD-10-CM

## 2017-11-30 DIAGNOSIS — R000 Tachycardia, unspecified: Secondary | ICD-10-CM | POA: Diagnosis present

## 2017-11-30 DIAGNOSIS — Z79899 Other long term (current) drug therapy: Secondary | ICD-10-CM | POA: Diagnosis not present

## 2017-11-30 DIAGNOSIS — F419 Anxiety disorder, unspecified: Secondary | ICD-10-CM

## 2017-11-30 DIAGNOSIS — B20 Human immunodeficiency virus [HIV] disease: Secondary | ICD-10-CM | POA: Diagnosis present

## 2017-11-30 DIAGNOSIS — R933 Abnormal findings on diagnostic imaging of other parts of digestive tract: Secondary | ICD-10-CM | POA: Diagnosis present

## 2017-11-30 DIAGNOSIS — R450 Nervousness: Secondary | ICD-10-CM

## 2017-11-30 DIAGNOSIS — Z8619 Personal history of other infectious and parasitic diseases: Secondary | ICD-10-CM | POA: Diagnosis present

## 2017-11-30 DIAGNOSIS — R634 Abnormal weight loss: Secondary | ICD-10-CM | POA: Diagnosis present

## 2017-11-30 DIAGNOSIS — F17210 Nicotine dependence, cigarettes, uncomplicated: Secondary | ICD-10-CM | POA: Diagnosis present

## 2017-11-30 DIAGNOSIS — R0981 Nasal congestion: Secondary | ICD-10-CM | POA: Diagnosis not present

## 2017-11-30 DIAGNOSIS — K0889 Other specified disorders of teeth and supporting structures: Secondary | ICD-10-CM

## 2017-11-30 DIAGNOSIS — Z9114 Patient's other noncompliance with medication regimen: Secondary | ICD-10-CM | POA: Diagnosis not present

## 2017-11-30 DIAGNOSIS — K228 Other specified diseases of esophagus: Secondary | ICD-10-CM | POA: Diagnosis present

## 2017-11-30 DIAGNOSIS — Z87828 Personal history of other (healed) physical injury and trauma: Secondary | ICD-10-CM

## 2017-11-30 DIAGNOSIS — R0902 Hypoxemia: Secondary | ICD-10-CM | POA: Diagnosis present

## 2017-11-30 DIAGNOSIS — R1310 Dysphagia, unspecified: Secondary | ICD-10-CM

## 2017-11-30 DIAGNOSIS — R12 Heartburn: Secondary | ICD-10-CM

## 2017-11-30 DIAGNOSIS — E871 Hypo-osmolality and hyponatremia: Secondary | ICD-10-CM | POA: Diagnosis present

## 2017-11-30 DIAGNOSIS — D649 Anemia, unspecified: Secondary | ICD-10-CM | POA: Diagnosis present

## 2017-11-30 DIAGNOSIS — K449 Diaphragmatic hernia without obstruction or gangrene: Secondary | ICD-10-CM | POA: Diagnosis present

## 2017-11-30 DIAGNOSIS — Z681 Body mass index (BMI) 19 or less, adult: Secondary | ICD-10-CM | POA: Diagnosis not present

## 2017-11-30 DIAGNOSIS — K2970 Gastritis, unspecified, without bleeding: Secondary | ICD-10-CM | POA: Diagnosis present

## 2017-11-30 LAB — CBC
HEMATOCRIT: 31.8 % — AB (ref 39.0–52.0)
Hemoglobin: 11.1 g/dL — ABNORMAL LOW (ref 13.0–17.0)
MCH: 32.6 pg (ref 26.0–34.0)
MCHC: 34.9 g/dL (ref 30.0–36.0)
MCV: 93.5 fL (ref 78.0–100.0)
PLATELETS: 208 10*3/uL (ref 150–400)
RBC: 3.4 MIL/uL — AB (ref 4.22–5.81)
RDW: 12.3 % (ref 11.5–15.5)
WBC: 7.2 10*3/uL (ref 4.0–10.5)

## 2017-11-30 LAB — BASIC METABOLIC PANEL
ANION GAP: 8 (ref 5–15)
CO2: 23 mmol/L (ref 22–32)
Calcium: 8 mg/dL — ABNORMAL LOW (ref 8.9–10.3)
Chloride: 99 mmol/L — ABNORMAL LOW (ref 101–111)
Creatinine, Ser: 0.81 mg/dL (ref 0.61–1.24)
GFR calc Af Amer: >60 mL/min (ref >60)
GFR calc non Af Amer: >60 mL/min (ref >60)
GLUCOSE: 110 mg/dL — AB (ref 65–99)
Potassium: 3.5 mmol/L (ref 3.5–5.1)
SODIUM: 130 mmol/L — AB (ref 135–145)

## 2017-11-30 LAB — PNEUMOCYSTIS JIROVECI SMEAR BY DFA: Pneumocystis jiroveci Ag: NEGATIVE

## 2017-11-30 LAB — T-HELPER CELLS (CD4) COUNT (NOT AT ARMC)
CD4 % Helper T Cell: 2 % — ABNORMAL LOW (ref 33–55)
CD4 T CELL ABS: 30 /uL — AB (ref 400–2700)

## 2017-11-30 MED ORDER — AZITHROMYCIN 600 MG PO TABS
1200.0000 mg | ORAL_TABLET | ORAL | Status: DC
  Administered 2017-11-30: 1200 mg via ORAL
  Filled 2017-11-30: qty 2

## 2017-11-30 MED ORDER — SULFAMETHOXAZOLE-TRIMETHOPRIM 800-160 MG PO TABS
1.0000 | ORAL_TABLET | Freq: Every day | ORAL | Status: DC
  Administered 2017-11-30 – 2017-12-01 (×2): 1 via ORAL
  Filled 2017-11-30 (×2): qty 1

## 2017-11-30 MED ORDER — ENSURE ENLIVE PO LIQD
237.0000 mL | Freq: Three times a day (TID) | ORAL | Status: DC
  Administered 2017-11-30 – 2017-12-01 (×3): 237 mL via ORAL

## 2017-11-30 MED ORDER — LORAZEPAM 1 MG PO TABS
1.0000 mg | ORAL_TABLET | Freq: Once | ORAL | Status: AC
  Administered 2017-11-30: 1 mg via ORAL
  Filled 2017-11-30: qty 1

## 2017-11-30 MED ORDER — SUCRALFATE 1 GM/10ML PO SUSP
1.0000 g | Freq: Three times a day (TID) | ORAL | Status: DC
  Administered 2017-11-30 (×3): 1 g via ORAL
  Filled 2017-11-30 (×3): qty 10

## 2017-11-30 MED ORDER — PRO-STAT SUGAR FREE PO LIQD
30.0000 mL | Freq: Three times a day (TID) | ORAL | Status: DC
  Administered 2017-11-30: 30 mL via ORAL
  Filled 2017-11-30 (×2): qty 30

## 2017-11-30 MED ORDER — PANTOPRAZOLE SODIUM 40 MG IV SOLR
40.0000 mg | Freq: Two times a day (BID) | INTRAVENOUS | Status: DC
  Administered 2017-11-30 (×2): 40 mg via INTRAVENOUS
  Filled 2017-11-30 (×2): qty 40
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
## 2017-11-30 NOTE — Progress Notes (Signed)
Lab called to say the quantiferon test drawn approx 2200 had to be thrown away (considered old) and more blood will have to be drawn this am to be sent out for testing.

## 2017-11-30 NOTE — Progress Notes (Signed)
Called Lab about H. Pylori test and method of collection.  Lab unsure.  Then paged Dr. on call regarding H. Pylori test.  Answer not known and instructed to hold off doing anything at this time.
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
## 2017-11-30 NOTE — Progress Notes (Signed)
Initial Nutrition Assessment  DOCUMENTATION CODES:   Severe malnutrition in context of chronic illness, Underweight  INTERVENTION:   Increase Ensure Enlive po to TID, each supplement provides 350 kcal and 20 grams of protein  NUTRITION DIAGNOSIS:   Severe Malnutrition related to chronic illness, catabolic illness(HIV) as evidenced by severe muscle depletion, severe fat depletion  GOAL:   Patient will meet greater than or equal to 90% of their needs  MONITOR:   PO intake, Supplement acceptance, Diet advancement, Labs, Weight trends, I & O's  REASON FOR ASSESSMENT:   Malnutrition Screening Tool, Consult Assessment of nutrition requirement/status  ASSESSMENT:   Pt with PMH of HIV not on ART presents with odynophagia and dysphagia   Discussed pt with RN Plan for EGD with likely dilatation tomorrow AM Pt states he has been unable to eat anything for the past 3 weeks. Reporting he has consumed "muscle milk type" protein shake and 1 bottle of soda daily. Pt reports even though he has been unable to eat he is very hungry and his energy level has remained fairly normal.   Pt endorses 25 lb weight loss in 3 weeks. Pt reports a UBW of 145 lbs. No recent weight history available per chart. Pt very concerned with recent weight loss. RD spent time answering pt's questions and promoting high calorie, high protein snacks upon diet advancement.   Pt amenable to nutritional supplementation while admitted however prefers strawberry and chocolate flavors.   Labs reviewed; Na 130, BUN <5, Albumin 2.9,  Medications reviewed; Protonix, Carafate, Magic Mouthwash QID  NUTRITION - FOCUSED PHYSICAL EXAM:    Most Recent Value  Orbital Region  Moderate depletion  Upper Arm Region  Severe depletion  Thoracic and Lumbar Region  Severe depletion  Buccal Region  Moderate depletion  Temple Region  Severe depletion  Clavicle Bone Region  Severe depletion  Clavicle and Acromion Bone Region  Severe  depletion  Scapular Bone Region  Unable to assess  Dorsal Hand  Moderate depletion  Patellar Region  Severe depletion  Anterior Thigh Region  Severe depletion  Posterior Calf Region  Severe depletion  Edema (RD Assessment)  None      Diet Order:  Diet full liquid Room service appropriate? Yes; Fluid consistency: Thin Diet NPO time specified  EDUCATION NEEDS:   Education needs have been addressed  Skin:  Skin Assessment: Reviewed RN Assessment  Last BM:  11/29/17  Height:   Ht Readings from Last 1 Encounters:  11/29/17 5\' 11"  (1.803 m)    Weight:   Wt Readings from Last 1 Encounters:  11/30/17 120 lb 9.1 oz (54.7 kg)    Ideal Body Weight:  78.2 kg  BMI:  Body mass index is 16.82 kg/m.  Estimated Nutritional Needs:   Kcal:  1900-2100  Protein:  90-100 grams  Fluid:  >/= 1.9 L/d  Fransisca KaufmannAllison Ioannides, MS, RDN, LDN 11/30/2017 1:31 PM
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
## 2017-11-30 NOTE — Progress Notes (Signed)
[id number]/Konstantin Lehnen,BSN,RN3,CCM: Chart reviewed for patient status and dc needs.  None present at time of review will follow. 

## 2017-11-30 NOTE — Progress Notes (Signed)
Pt c/o severe "pressure flareup in the esophagus".. Asked for Zantac but dose given earlier.  States swallowing is worse since taking Zantac.

## 2017-12-01 ENCOUNTER — Inpatient Hospital Stay (HOSPITAL_COMMUNITY): Payer: Non-veteran care | Admitting: Certified Registered Nurse Anesthetist

## 2017-12-01 ENCOUNTER — Encounter (HOSPITAL_COMMUNITY): Payer: Self-pay | Admitting: Anesthesiology

## 2017-12-01 ENCOUNTER — Other Ambulatory Visit: Payer: Self-pay | Admitting: Pharmacist

## 2017-12-01 ENCOUNTER — Encounter (HOSPITAL_COMMUNITY)
Admission: EM | Disposition: A | Discharge status: Home / Self Care | Payer: Self-pay | Source: Home / Self Care | Attending: Internal Medicine

## 2017-12-01 DIAGNOSIS — Z8619 Personal history of other infectious and parasitic diseases: Secondary | ICD-10-CM

## 2017-12-01 DIAGNOSIS — B20 Human immunodeficiency virus [HIV] disease: Principal | ICD-10-CM

## 2017-12-01 HISTORY — PX: ESOPHAGOGASTRODUODENOSCOPY (EGD) WITH PROPOFOL: SHX5813

## 2017-12-01 LAB — COMPREHENSIVE METABOLIC PANEL
ALBUMIN: 2.3 g/dL — AB (ref 3.5–5.0)
ALK PHOS: 88 U/L (ref 38–126)
ALT: 15 U/L — AB (ref 17–63)
AST: 26 U/L (ref 15–41)
Anion gap: 8 (ref 5–15)
BILIRUBIN TOTAL: 0.6 mg/dL (ref 0.3–1.2)
CO2: 25 mmol/L (ref 22–32)
Calcium: 8.2 mg/dL — ABNORMAL LOW (ref 8.9–10.3)
Chloride: 101 mmol/L (ref 101–111)
Creatinine, Ser: 0.65 mg/dL (ref 0.61–1.24)
GFR calc Af Amer: >60 mL/min (ref >60)
GFR calc non Af Amer: >60 mL/min (ref >60)
GLUCOSE: 85 mg/dL (ref 65–99)
POTASSIUM: 4 mmol/L (ref 3.5–5.1)
Sodium: 134 mmol/L — ABNORMAL LOW (ref 135–145)
TOTAL PROTEIN: 7.1 g/dL (ref 6.5–8.1)

## 2017-12-01 LAB — CBC WITH DIFFERENTIAL/PLATELET
BASOS ABS: 0 10*3/uL (ref 0.0–0.1)
BASOS PCT: 1 %
Eosinophils Absolute: 0.1 10*3/uL (ref 0.0–0.7)
Eosinophils Relative: 1 %
HEMATOCRIT: 37.1 % — AB (ref 39.0–52.0)
Hemoglobin: 12.5 g/dL — ABNORMAL LOW (ref 13.0–17.0)
Lymphocytes Relative: 34 %
Lymphs Abs: 1.6 10*3/uL (ref 0.7–4.0)
MCH: 32.3 pg (ref 26.0–34.0)
MCHC: 33.7 g/dL (ref 30.0–36.0)
MCV: 95.9 fL (ref 78.0–100.0)
Monocytes Absolute: 0.7 10*3/uL (ref 0.1–1.0)
Monocytes Relative: 15 %
NEUTROS ABS: 2.3 10*3/uL (ref 1.7–7.7)
NEUTROS PCT: 49 %
Platelets: 192 10*3/uL (ref 150–400)
RBC: 3.87 MIL/uL — AB (ref 4.22–5.81)
RDW: 12.5 % (ref 11.5–15.5)
WBC: 4.7 10*3/uL (ref 4.0–10.5)

## 2017-12-01 LAB — CMV DNA, QUANTITATIVE, PCR
CMV DNA QUANT: POSITIVE [IU]/mL
LOG10 CMV QN DNA PL: UNDETERMINED {Log_IU}/mL

## 2017-12-01 LAB — GLUCOSE, CAPILLARY
Glucose-Capillary: 68 mg/dL (ref 65–99)
Glucose-Capillary: 92 mg/dL (ref 65–99)

## 2017-12-01 LAB — HIV-1 RNA QUANT-NO REFLEX-BLD
HIV 1 RNA QUANT: 483000 {copies}/mL
LOG10 HIV-1 RNA: 5.684 {Log_copies}/mL

## 2017-12-01 SURGERY — ESOPHAGOGASTRODUODENOSCOPY (EGD) WITH PROPOFOL
Anesthesia: Monitor Anesthesia Care | Laterality: Left

## 2017-12-01 MED ORDER — LIDOCAINE 2% (20 MG/ML) 5 ML SYRINGE
INTRAMUSCULAR | Status: DC | PRN
  Administered 2017-12-01: 100 mg via INTRAVENOUS

## 2017-12-01 MED ORDER — PANTOPRAZOLE SODIUM 40 MG IV SOLR: 40.0000 mg | INTRAVENOUS | Status: DC

## 2017-12-01 MED ORDER — SULFAMETHOXAZOLE-TRIMETHOPRIM 800-160 MG PO TABS: 1.0000 | ORAL_TABLET | Freq: Every day | ORAL | 0 refills | Status: DC

## 2017-12-01 MED ORDER — LACTATED RINGERS IV SOLN
INTRAVENOUS | Status: DC
  Administered 2017-12-01: 1000 mL via INTRAVENOUS

## 2017-12-01 MED ORDER — ONDANSETRON HCL 4 MG/2ML IJ SOLN
INTRAMUSCULAR | Status: DC | PRN
  Administered 2017-12-01: 4 mg via INTRAVENOUS

## 2017-12-01 MED ORDER — PANTOPRAZOLE SODIUM 40 MG PO TBEC: 40.0000 mg | DELAYED_RELEASE_TABLET | Freq: Every day | ORAL | 1 refills | Status: DC

## 2017-12-01 MED ORDER — PROPOFOL 10 MG/ML IV BOLUS
INTRAVENOUS | Status: DC | PRN
  Administered 2017-12-01: 30 mg via INTRAVENOUS
  Administered 2017-12-01: 20 mg via INTRAVENOUS
  Administered 2017-12-01: 10 mg via INTRAVENOUS

## 2017-12-01 MED ORDER — PROPOFOL 500 MG/50ML IV EMUL
INTRAVENOUS | Status: DC | PRN
  Administered 2017-12-01: 200 ug/kg/min via INTRAVENOUS

## 2017-12-01 MED ORDER — AZITHROMYCIN 600 MG PO TABS: 1200.0000 mg | ORAL_TABLET | ORAL | 0 refills | Status: DC

## 2017-12-01 MED ORDER — PROPOFOL 10 MG/ML IV BOLUS
INTRAVENOUS | Status: AC
  Filled 2017-12-01: qty 40

## 2017-12-01 NOTE — Brief Op Note (Signed)
11/29/2017 - 12/01/2017  8:33 AM  PATIENT:  Travis Mcgee  37 y.o. male  PRE-OPERATIVE DIAGNOSIS:  dysphagia, odynophagia, abnormal CT chest  POST-OPERATIVE DIAGNOSIS:  gastritis, abnormal esophageal mucosa  PROCEDURE:  Procedure(s): ESOPHAGOGASTRODUODENOSCOPY (EGD) WITH PROPOFOL (Left)  SURGEON:  Surgeon(s) and Role:    Ronnette Juniper, MD - Primary  PHYSICIAN ASSISTANT: None  ASSISTANTS: Tory Emerald, RN, Nevin Bloodgood, Tech, Virgia Land, CRNA  ANESTHESIA:   MAC  EBL:  None   BLOOD ADMINISTERED:none  DRAINS: none   LOCAL MEDICATIONS USED:  NONE  SPECIMEN:  Biopsy / Limited Resection  DISPOSITION OF SPECIMEN:  PATHOLOGY  COUNTS:  YES  TOURNIQUET:  * No tourniquets in log *  DICTATION: .Dragon Dictation  PLAN OF CARE: Admit to inpatient   PATIENT DISPOSITION:  PACU - hemodynamically stable.   Delay start of Pharmacological VTE agent (>24hrs) due to surgical blood loss or risk of bleeding: no

## 2017-12-01 NOTE — Op Note (Signed)
[facility] Patient Name: Travis Mcgee Procedure Date: 12/01/2017 MRN: [id number] Attending MD: Kerin Salen , MD Date of Birth: 02/12/1981 CSN: [id number] Age: 37 Admit Type: Inpatient Procedure:                Upper GI endoscopy Indications:              Dysphagia, Odynophagia, Abnormal CT of the GI                            tract(diffuse esophageal wall thickening?distal                            esophageal stricture) Providers:                Kerin Salen, MD, Janae Sauce. Steele Berg, RN, Judithann Sauger, Technician, Maricela Curet, CRNA Referring MD:              Medicines:                Monitored Anesthesia Care Complications:            No immediate complications. Estimated Blood Loss:     Estimated blood loss: none. Procedure:                Pre-Anesthesia Assessment:                           - Prior to the procedure, a History and Physical                            was performed, and patient medications and                            allergies were reviewed. The patient's tolerance of                            previous anesthesia was also reviewed. The risks                            and benefits of the procedure and the sedation                            options and risks were discussed with the patient.                            All questions were answered, and informed consent                            was obtained. Prior Anticoagulants: The patient has                            taken no previous anticoagulant or antiplatelet                            agents. ASA  Grade Assessment: II - A patient with                            mild systemic disease. After reviewing the risks                            and benefits, the patient was deemed in                            satisfactory condition to undergo the procedure.                           After obtaining informed consent, the endoscope was                            passed under  direct vision. Throughout the                            procedure, the patient's blood pressure, pulse, and                            oxygen saturations were monitored continuously. The                            Endoscope was introduced through the mouth, and                            advanced to the second part of duodenum. The upper                            GI endoscopy was accomplished without difficulty.                            The patient tolerated the procedure well. Scope In: Scope Out: Findings:      Patchy, moderate mucosal changes characterized by nodularity, scalloping       and altered texture, almost cobblestone-like appearance was found in the       middle third of the esophagus and in the lower third of the       esophagus(from 25 to 40 cm from insertion). Biopsies were taken with a       cold forceps for histology.      There was no obvious evidence of candida or ulceration.      The Z-line was regular and was found 40 cm from the incisors.      A 2 cm hiatal hernia was present.      Localized mildly erythematous mucosa without bleeding was found in the       gastric antrum. Biopsies were taken with a cold forceps for Helicobacter       pylori testing.      The cardia and gastric fundus were normal on retroflexion.      The examined duodenum was normal.      There was no stricture or luminal narrowing noted, the adult gastroscope       could be advanced into the gastric cavity without any resistance. Impression:               -  Nodular, scalloped, texture changed mucosa in the                            esophagus. Biopsied.                           - Z-line regular, 40 cm from the incisors.                           - 2 cm hiatal hernia.                           - Erythematous mucosa in the antrum. Biopsied.                           - Normal examined duodenum.                           - No stricture/ring/luminal narrowing noted. Moderate Sedation:       Patient did not receive moderate sedation for this procedure, but       instead received monitored anesthesia care. Recommendation:           - Mechanical soft diet today.                           - Await pathology results.                           - D/C sucralfate, PPI once a day, D/C fluconazole.                           If biopsies unrevealing, may benefit from an                            esophageal manometry.                           - Return patient to hospital ward for ongoing care.                           - Use Protonix (pantoprazole) 40 mg PO daily.                           - Post procedure medication orders were given. Procedure Code(s):        --- Professional ---                           [phone], Esophagogastroduodenoscopy, flexible,                            transoral; with biopsy, single or multiple Diagnosis Code(s):        --- Professional ---                           K22.8, Other specified diseases of esophagus  K31.89, Other diseases of stomach and duodenum                           K44.9, Diaphragmatic hernia without obstruction or                            gangrene                           R13.10, Dysphagia, unspecified                           R93.3, Abnormal findings on diagnostic imaging of                            other parts of digestive tract CPT copyright 2016 American Medical Association. All rights reserved. The codes documented in this report are preliminary and upon coder review may  be revised to meet current compliance requirements. Kerin SalenArya Jovonte Commins, MD 12/01/2017 8:33:09 AM This report has been signed electronically. Number of Addenda: 0

## 2017-12-01 NOTE — Interval H&P Note (Signed)
History and Physical Interval Note:  36/male with HIV with dysphagia, odynophagia and abnormal CT, for diagnostic EGD with possible dilatation. 12/01/2017 7:59 AM  Travis NullJeffery Mcgee  has presented today for EGD, with the diagnosis of dysphagia, odynophagia, abnormal CT chest  The various methods of treatment have been discussed with the patient and family. After consideration of risks, benefits and other options for treatment, the patient has consented to  Procedure(s): ESOPHAGOGASTRODUODENOSCOPY (EGD) WITH PROPOFOL (Left) as a surgical intervention .  The patient's history has been reviewed, patient examined, no change in status, stable for surgery.  I have reviewed the patient's chart and labs.  Questions were answered to the patient's satisfaction.     Kerin SalenArya Abdullahi Vallone

## 2017-12-01 NOTE — Progress Notes (Signed)
Patient given discharge instructions, and verbalized an understanding of all discharge instructions.  Patient agrees with discharge plan, and is being discharged in stable medical condition.  Patient given transportation via wheelchair. 

## 2017-12-01 NOTE — Anesthesia Postprocedure Evaluation (Signed)
Anesthesia Post Note  Patient: Travis Mcgee  Procedure(s) Performed: ESOPHAGOGASTRODUODENOSCOPY (EGD) WITH PROPOFOL (Left )     Patient location during evaluation: Endoscopy Anesthesia Type: MAC Level of consciousness: awake Pain management: pain level controlled Vital Signs Assessment: post-procedure vital signs reviewed and stable Respiratory status: spontaneous breathing Cardiovascular status: stable Postop Assessment: no apparent nausea or vomiting Anesthetic complications: no    Last Vitals:  Vitals:   12/01/17 0830 12/01/17 0840  BP: 107/73 111/76  Pulse:  88  Resp: (!) 26 (!) 28  Temp:    SpO2: 96% 98%    Last Pain:  Vitals:   12/01/17 0829  TempSrc: Oral  PainSc:    Pain Goal:                 Kiel Cockerell JR,JOHN Mecca Guitron
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
## 2017-12-01 NOTE — Discharge Instructions (Signed)
Follow with Primary MD in 7 days   Get CBC, CMP, 2 view Chest X ray checked  by Primary MD in 5-7 days   Activity: As tolerated with Full fall precautions use walker/cane & assistance as needed  Disposition Home  Diet:   Soft diet for a week then advance to regular consistency heart healthy diet as tolerated.  For Heart failure patients - Check your Weight same time everyday, if you gain over 2 pounds, or you develop in leg swelling, experience more shortness of breath or chest pain, call your Primary MD immediately. Follow Cardiac Low Salt Diet and 1.5 lit/day fluid restriction.  Special Instructions: If you have smoked or chewed Tobacco  in the last 2 yrs please stop smoking, stop any regular Alcohol  and or any Recreational drug use.  On your next visit with your primary care physician please Get Medicines reviewed and adjusted.  Please request your Prim.MD to go over all Hospital Tests and Procedure/Radiological results at the follow up, please get all Hospital records sent to your Prim MD by signing hospital release before you go home.  If you experience worsening of your admission symptoms, develop shortness of breath, life threatening emergency, suicidal or homicidal thoughts you must seek medical attention immediately by calling 911 or calling your MD immediately  if symptoms less severe.  You Must read complete instructions/literature along with all the possible adverse reactions/side effects for all the Medicines you take and that have been prescribed to you. Take any new Medicines after you have completely understood and accpet all the possible adverse reactions/side effects.   Do not drive, operate heavy machinery, perform activities at heights, swimming or participation in water activities or provide baby sitting services if your were admitted for syncope or siezures until you have seen by Primary MD or a Neurologist and advised to do so again.  Do not drive when taking Pain  medications.    Do not take more than prescribed Pain, Sleep and Anxiety Medications  Wear Seat belts while driving.   Please note  You were cared for by a hospitalist during your hospital stay. If you have any questions about your discharge medications or the care you received while you were in the hospital after you are discharged, you can call the unit and asked to speak with the hospitalist on call if the hospitalist that took care of you is not available. Once you are discharged, your primary care physician will handle any further medical issues. Please note that NO REFILLS for any discharge medications will be authorized once you are discharged, as it is imperative that you return to your primary care physician (or establish a relationship with a primary care physician if you do not have one) for your aftercare needs so that they can reassess your need for medications and monitor your lab values.

## 2017-12-01 NOTE — Op Note (Signed)
EGD was performed for dysphagia, odynophagia and abnormal CAT scan showing diffuse esophageal wall thickening with possible distal esophageal stricture.  Esophageal mucosa appeared abnormal from 25-40 cm from insertion.  Patchy and moderate mucosal changes characterized by nodularity, scalloping and altered texture , almost cobblestone like appearance was found in the middle and lower third of esophagus. Biopsies taken and sent to pathology.   No obvious ulceration, features of Candida, stricturing or luminal narrowing noted. Adult gastroscope was advanced into gastric cavity without any resistance. 2 cm hiatal hernia noted. Mild erythema noted in the antrum, biopsies taken for H. Pylori. Retroflexion, duodenal bulb and rest of the duodenum appeared unremarkable.   Recommendations: Discontinue fluconazole Discontinue sucralfate Continue PPI once a day Await pathology report and treat accordingly. If pathology unrevealing, patient may benefit from esophageal manometry to rule out a motility disorder. Mechanical soft diet.   Kerin SalenArya Hardeep Reetz, M.D.

## 2017-12-01 NOTE — Transfer of Care (Signed)
Immediate Anesthesia Transfer of Care Note  Patient: Devlyn Retter  Procedure(s) Performed: ESOPHAGOGASTRODUODENOSCOPY (EGD) WITH PROPOFOL (Left )  Patient Location: PACU  Anesthesia Type:MAC  Level of Consciousness: awake, alert  and oriented  Airway & Oxygen Therapy: Patient Spontanous Breathing and Patient connected to nasal cannula oxygen  Post-op Assessment: Report given to RN and Post -op Vital signs reviewed and stable  Post vital signs: Reviewed and stable  Last Vitals:  Vitals:   12/01/17 0736 12/01/17 0829  BP: 113/74   Pulse: 91 86  Resp: 20 (!) 37  Temp: 37.2 C   SpO2: 100% 92%    Last Pain:  Vitals:   12/01/17 0829  TempSrc: Oral  PainSc:          Complications: No apparent anesthesia complications

## 2017-12-01 NOTE — Anesthesia Preprocedure Evaluation (Addendum)
Anesthesia Evaluation  Patient identified by MRN, date of birth, ID band Patient awake    Reviewed: Allergy & Precautions, NPO status , Patient's Chart, lab work & pertinent test results  Airway Mallampati: I       Dental  (+) Poor Dentition   Pulmonary Current Smoker,    Pulmonary exam normal breath sounds clear to auscultation       Cardiovascular Normal cardiovascular exam Rhythm:Regular Rate:Normal     Neuro/Psych    GI/Hepatic   Endo/Other    Renal/GU      Musculoskeletal   Abdominal Normal abdominal exam  (+)   Peds  Hematology  (+) Blood dyscrasia, anemia , HIV,   Anesthesia Other Findings   Reproductive/Obstetrics                            Anesthesia Physical Anesthesia Plan  ASA: III  Anesthesia Plan: MAC   Post-op Pain Management:    Induction:   PONV Risk Score and Plan:   Airway Management Planned: Natural Airway, Nasal Cannula and Simple Face Mask  Additional Equipment:   Intra-op Plan:   Post-operative Plan:   Informed Consent: I have reviewed the patients History and Physical, chart, labs and discussed the procedure including the risks, benefits and alternatives for the proposed anesthesia with the patient or authorized representative who has indicated his/her understanding and acceptance.     Plan Discussed with: CRNA  Anesthesia Plan Comments:        Anesthesia Quick Evaluation

## 2017-12-02 LAB — QUANTIFERON-TB GOLD PLUS: QuantiFERON-TB Gold Plus: UNDETERMINED

## 2017-12-02 LAB — QUANTIFERON-TB GOLD PLUS (RQFGPL)
QUANTIFERON TB1 AG VALUE: 0.05 [IU]/mL
QuantiFERON Mitogen Value: 0.5 IU/mL
QuantiFERON Nil Value: 0.05 IU/mL
QuantiFERON TB2 Ag Value: 0.04 IU/mL

## 2017-12-02 LAB — RPR, QUANT+TP ABS (REFLEX): T Pallidum Abs: POSITIVE — AB

## 2017-12-02 LAB — RPR: RPR: REACTIVE — AB

## 2017-12-04 ENCOUNTER — Encounter: Payer: Self-pay | Admitting: Infectious Diseases

## 2017-12-04 ENCOUNTER — Ambulatory Visit (INDEPENDENT_AMBULATORY_CARE_PROVIDER_SITE_OTHER): Payer: No Typology Code available for payment source | Admitting: Infectious Diseases

## 2017-12-04 VITALS — BP 105/73 | HR 101 | Temp 98.4°F | Ht 71.0 in | Wt 133.0 lb

## 2017-12-04 DIAGNOSIS — R634 Abnormal weight loss: Secondary | ICD-10-CM

## 2017-12-04 DIAGNOSIS — K089 Disorder of teeth and supporting structures, unspecified: Secondary | ICD-10-CM | POA: Diagnosis not present

## 2017-12-04 DIAGNOSIS — Z8619 Personal history of other infectious and parasitic diseases: Secondary | ICD-10-CM | POA: Diagnosis not present

## 2017-12-04 DIAGNOSIS — B20 Human immunodeficiency virus [HIV] disease: Secondary | ICD-10-CM | POA: Diagnosis not present

## 2017-12-04 MED ORDER — AZITHROMYCIN 600 MG PO TABS: 1200.0000 mg | ORAL_TABLET | ORAL | 1 refills | Status: DC

## 2017-12-04 MED ORDER — MIRTAZAPINE 15 MG PO TABS: 15.0000 mg | ORAL_TABLET | Freq: Every day | ORAL | 3 refills | Status: DC

## 2017-12-04 MED ORDER — BICTEGRAVIR-EMTRICITAB-TENOFOV 50-200-25 MG PO TABS: 1.0000 | ORAL_TABLET | Freq: Every day | ORAL | 1 refills | Status: DC

## 2017-12-04 MED ORDER — BICTEGRAVIR-EMTRICITAB-TENOFOV 50-200-25 MG PO TABS: 1.0000 | ORAL_TABLET | Freq: Every day | ORAL | 5 refills | Status: DC

## 2017-12-04 MED ORDER — AZITHROMYCIN 600 MG PO TABS: 600.0000 mg | ORAL_TABLET | ORAL | 5 refills | Status: DC

## 2017-12-04 MED ORDER — SULFAMETHOXAZOLE-TRIMETHOPRIM 800-160 MG PO TABS: 1.0000 | ORAL_TABLET | Freq: Every day | ORAL | 3 refills | Status: DC

## 2017-12-04 MED FILL — AZITHROMYCIN 600 MG TABLET: 600 | 4 days supply | Qty: 8 | Fill #0

## 2017-12-04 MED FILL — BIKTARVY 50-200-25 MG TABS: 50-200-25 | 30 days supply | Qty: 30 | Fill #0
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
## 2017-12-04 NOTE — Assessment & Plan Note (Signed)
Low-level serofast titer 1:2. No indication for treatment as of now. Will continue to monitor.

## 2017-12-04 NOTE — Assessment & Plan Note (Signed)
Wasting/malnutrition related to his AIDS. He was interested in starting something for sleep also - I will begin Mirtazepine 15 mg QHS to see if this will help. Counseled that his appetitie and weight should improve with Biktarvy alone with good adherence and suppression of his virus.

## 2017-12-04 NOTE — Assessment & Plan Note (Signed)
Will plan on re-swabbing oral/genital/urethral specimens at next visit. We did not have time to coordinate this today. No active symptoms and was treated last 12/2016.

## 2017-12-04 NOTE — Assessment & Plan Note (Signed)
I have provided him with information to contact [facility] case workers and we have had him sign up for dental clinic here. I did tell him it is going to be several months before he can be seen considering his AIDS status. He understands.
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
## 2017-12-04 NOTE — Patient Instructions (Signed)
I would like to get you started on Biktarvy today for your HIV.   Biktarvy is the pill for your HIV infection - this will need to be taken once a day around the same time.  - Common side effects for a short time frame usually include headaches, nausea and diarrhea - OK to take over the counter tylenol for headaches and imodium for diarrhea - Try taking with food if you are nauseated  - please separate this by 6 hours from any antacids or multivitamins  I would like to start you on a medication called Mirtazapine for your sleep and appetite. This should be taken in the evening after dinner because it make you sleepy. We can increase this if not effective in 3 - 4 weeks. Your appetite should improve on Biktarvy as well once we get your virus suppressed.

## 2017-12-04 NOTE — Assessment & Plan Note (Signed)
From his discussion with me (I have no records for him available) he has been only on Atripla and was well controlled. He apparently had a low CD4 at entry to care in the past as well because he discussed "preventative pneumonia antibiotic" with me. Since he has never been on an INSTI in the past I will start him on Biktarvy today. This should be easy for him to swallow and offer a high barrier to resistance to treat his HIV. Travis Mcgee has arranged for Azithromycin to be paid for through [facility] for 2 months worth of medicines. I have also given him 4 months worth of Bactrim.   [place] be eligible for HMAP. We have also looked into Advancing Access for him in addition to 30-day free rx today. He will come back to meet with Pharmacy team in 4 weeks after starting his medications.

## 2017-12-07 LAB — RPR, QUANT+TP ABS (REFLEX): TREPONEMA PALLIDUM AB: POSITIVE — AB

## 2017-12-07 LAB — RPR: RPR Ser Ql: REACTIVE — AB

## 2017-12-22 ENCOUNTER — Encounter: Payer: Self-pay | Admitting: Infectious Diseases

## 2017-12-28 ENCOUNTER — Ambulatory Visit (INDEPENDENT_AMBULATORY_CARE_PROVIDER_SITE_OTHER): Payer: Non-veteran care | Admitting: Pharmacist Clinician (PhC)/ Clinical Pharmacy Specialist

## 2017-12-28 DIAGNOSIS — Z23 Encounter for immunization: Secondary | ICD-10-CM | POA: Diagnosis not present

## 2017-12-28 DIAGNOSIS — B20 Human immunodeficiency virus [HIV] disease: Secondary | ICD-10-CM | POA: Diagnosis not present

## 2017-12-28 MED FILL — BIKTARVY 50-200-25 MG TABS: 50-200-25 | 30 days supply | Qty: 30 | Fill #1
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
## 2017-12-29 LAB — BASIC METABOLIC PANEL
BUN/Creatinine Ratio: 8 (calc) (ref 6–22)
BUN: 6 mg/dL — ABNORMAL LOW (ref 7–25)
CO2: 22 mmol/L (ref 20–32)
Calcium: 9.5 mg/dL (ref 8.6–10.3)
Chloride: 105 mmol/L (ref 98–110)
Creat: 0.76 mg/dL (ref 0.60–1.35)
Glucose, Bld: 82 mg/dL (ref 65–99)
Potassium: 4.3 mmol/L (ref 3.5–5.3)
Sodium: 134 mmol/L — ABNORMAL LOW (ref 135–146)

## 2017-12-29 LAB — HEPATITIS B SURFACE ANTIGEN: Hepatitis B Surface Ag: NONREACTIVE

## 2017-12-29 LAB — HEPATITIS A ANTIBODY, TOTAL: HEPATITIS A AB,TOTAL: REACTIVE — AB

## 2017-12-29 LAB — HEPATITIS B SURFACE ANTIBODY,QUALITATIVE: Hep B S Ab: NONREACTIVE

## 2017-12-30 LAB — HEPATITIS C AB W/RFL RNA, PCR + GENO
HEP C AB: NONREACTIVE
SIGNAL TO CUT-OFF: 0.17 (ref <1.00)

## 2017-12-30 LAB — HIV-1 RNA QUANT-NO REFLEX-BLD
HIV 1 RNA Quant: 163 copies/mL — ABNORMAL HIGH
HIV-1 RNA QUANT, LOG: 2.21 {Log_copies}/mL — AB

## 2018-02-02 ENCOUNTER — Encounter: Payer: Self-pay | Admitting: Infectious Diseases

## 2018-03-01 ENCOUNTER — Ambulatory Visit: Payer: No Typology Code available for payment source

## 2018-03-03 ENCOUNTER — Ambulatory Visit: Payer: No Typology Code available for payment source

## 2018-08-04 ENCOUNTER — Telehealth: Payer: Self-pay | Admitting: Behavioral Health

## 2018-08-04 NOTE — Telephone Encounter (Signed)
Called patient, no answer and unable to leave a voicemail.  Patient's adap has expired and needs a lab visit along with office visit. Angeline Slim RN

## 2019-02-13 ENCOUNTER — Other Ambulatory Visit: Payer: Self-pay | Admitting: Infectious Diseases

## 2019-06-21 ENCOUNTER — Ambulatory Visit: Payer: Non-veteran care

## 2019-06-21 ENCOUNTER — Other Ambulatory Visit: Payer: Non-veteran care

## 2019-06-21 ENCOUNTER — Other Ambulatory Visit: Payer: Self-pay | Admitting: *Deleted

## 2019-06-21 DIAGNOSIS — Z79899 Other long term (current) drug therapy: Secondary | ICD-10-CM

## 2019-06-21 DIAGNOSIS — Z113 Encounter for screening for infections with a predominantly sexual mode of transmission: Secondary | ICD-10-CM

## 2019-06-21 DIAGNOSIS — B20 Human immunodeficiency virus [HIV] disease: Secondary | ICD-10-CM

## 2019-06-22 ENCOUNTER — Ambulatory Visit: Payer: Non-veteran care

## 2019-06-22 ENCOUNTER — Other Ambulatory Visit: Payer: Non-veteran care

## 2019-07-11 ENCOUNTER — Encounter: Payer: Non-veteran care | Admitting: Infectious Diseases

## 2019-07-18 ENCOUNTER — Other Ambulatory Visit: Payer: Non-veteran care

## 2019-07-18 ENCOUNTER — Ambulatory Visit: Payer: Non-veteran care

## 2019-08-08 ENCOUNTER — Other Ambulatory Visit: Payer: Self-pay

## 2019-08-08 ENCOUNTER — Ambulatory Visit (INDEPENDENT_AMBULATORY_CARE_PROVIDER_SITE_OTHER): Payer: Non-veteran care | Admitting: Infectious Diseases

## 2019-08-08 ENCOUNTER — Telehealth: Payer: Self-pay | Admitting: Pharmacy Technician

## 2019-08-08 ENCOUNTER — Encounter: Payer: Self-pay | Admitting: Infectious Diseases

## 2019-08-08 ENCOUNTER — Other Ambulatory Visit (HOSPITAL_COMMUNITY)
Admission: RE | Admit: 2019-08-08 | Discharge: 2019-08-08 | Disposition: A | Discharge status: Home / Self Care | Payer: Non-veteran care | Source: Ambulatory Visit | Attending: Infectious Diseases | Admitting: Infectious Diseases

## 2019-08-08 VITALS — BP 123/90 | HR 121 | Temp 99.0°F | Ht 71.0 in | Wt 136.8 lb

## 2019-08-08 DIAGNOSIS — R1310 Dysphagia, unspecified: Secondary | ICD-10-CM

## 2019-08-08 DIAGNOSIS — Z113 Encounter for screening for infections with a predominantly sexual mode of transmission: Secondary | ICD-10-CM | POA: Insufficient documentation

## 2019-08-08 DIAGNOSIS — Z8619 Personal history of other infectious and parasitic diseases: Secondary | ICD-10-CM

## 2019-08-08 DIAGNOSIS — Z598 Other problems related to housing and economic circumstances: Secondary | ICD-10-CM

## 2019-08-08 DIAGNOSIS — B20 Human immunodeficiency virus [HIV] disease: Secondary | ICD-10-CM | POA: Diagnosis not present

## 2019-08-08 DIAGNOSIS — G4700 Insomnia, unspecified: Secondary | ICD-10-CM

## 2019-08-08 DIAGNOSIS — Z5989 Other problems related to housing and economic circumstances: Secondary | ICD-10-CM

## 2019-08-08 MED ORDER — SULFAMETHOXAZOLE-TRIMETHOPRIM 800-160 MG PO TABS: 1.0000 | ORAL_TABLET | ORAL | 6 refills | Status: DC

## 2019-08-08 MED ORDER — FLUCONAZOLE 200 MG PO TABS: 200.0000 mg | ORAL_TABLET | Freq: Every day | ORAL | 0 refills | Status: DC

## 2019-08-08 MED ORDER — BIKTARVY 50-200-25 MG PO TABS: 1.0000 | ORAL_TABLET | Freq: Every day | ORAL | 5 refills | Status: DC

## 2019-08-08 MED ORDER — BIKTARVY 50-200-25 MG PO TABS: 1.0000 | ORAL_TABLET | Freq: Every day | ORAL | 1 refills | Status: DC

## 2019-08-08 MED ORDER — MIRTAZAPINE 15 MG PO TABS: 15.0000 mg | ORAL_TABLET | Freq: Every day | ORAL | 3 refills | Status: DC

## 2019-08-08 NOTE — Assessment & Plan Note (Signed)
I spent 25 minutes with the patient in review of the above and in coordination for his care to receive medications and access to clinic financial resources.

## 2019-08-08 NOTE — Patient Instructions (Signed)
Nice to see you today and we are glad you are back so we can take care of you.   I have sent in several prescriptions for you. Some of it may change after today's blood work.   Biktarvy is the pill I would like for you to start taking to treat you - this will need to be taken once a day around the same time.  - Common side effects for a short time frame usually include headaches, nausea and diarrhea - OK to take over the counter tylenol for headaches and imodium for diarrhea - Try taking with food if you are nauseated  -If you take any multivitamins or supplements please separate them from your Biktarvy by 6 hours before and after.  The main thing is do not have them in the stomach at the same time.   Other medications:  1. Bactrim (antibiotic for your immune system) 1 tablet 3 times a week (Monday, Wednesday Friday)  2. Fluconazole (small pink pill that may help your swallowing) - see if this helps your swallowing pain. If it does and you need more please call the pharmacy for a refill.   3. Mirtazapine (sleep/increased appetite)    Please come back to see Colletta Maryland in 2 months to check in again.

## 2019-08-08 NOTE — Assessment & Plan Note (Signed)
Refill remeron as requested. This seemed to work well for him last time.

## 2019-08-08 NOTE — Assessment & Plan Note (Addendum)
Aside from likely esophageal candidiasis superinfection no findings concerning for OIs today. He has no trouble with headaches, fevers or chills. No adenopathy. No diarrhea. No rashes. Will place him back on OI proph with bactrim 3x/week. Likely needs Azithromycin added too. Will see what CD4 looks like. I suspect he is low with last known 30 being off medications longer than he perceives.   Will continue him on Biktarvy for now. I don't suspect he has taken this at all since April 2019 based on fill information from pharmacies. Will run formal genotype including integrase today. He is interested in injectable options. Explained that he will first need to be able to be suppressed on Biktarvy. Will talk with him more about referral to ACTG study at upcoming visit.

## 2019-08-08 NOTE — Assessment & Plan Note (Signed)
He has some red punctate lesions on soft tissue that may be erythematous thrush. Will trial with fluconazole 200 mg QD for a week to see if this offers benefit. Can refill for him if needed.   Clinical exam unremarkable today but reports of ongoing "hanging up" and pain. Will refer back to [facility]. He will need access to financial assistance in the future - can provide at future visit.

## 2019-08-08 NOTE — Telephone Encounter (Signed)
[facility] Patient Advocate Encounter  An application for [employer] Advancing Access was faxed today to get the patient new coverage. He has already received the 30-day fill. We will set him up at [facility] once the application is approved.
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
## 2019-08-08 NOTE — Assessment & Plan Note (Signed)
Rescreen RPR today for stability of titer.   Will screen for Oral/Rectal and urine gonorrhea and chlamydia.

## 2019-08-09 LAB — T-HELPER CELL (CD4) - (RCID CLINIC ONLY)
CD4 % Helper T Cell: 4 % — ABNORMAL LOW (ref 33–65)
CD4 T Cell Abs: 45 /uL — ABNORMAL LOW (ref 400–1790)

## 2019-08-09 NOTE — Telephone Encounter (Signed)
[facility] Patient Advocate Encounter   Patient has been approved for [employer] Advancing Access Patient Assistance Program for [place]  from 08/09/2019 to 08/08/2020. This assistance will make the patient's copay $0.  I have spoken with the patient and they will walk and pick up the medication today from [facility]. He will also be picking up his Fluconazole, Remeron and Bactrim using the GoodRx card until U[employer] approved.  The billing information is as follows:  Member ID: [id number] [place]: [id number] PCN: [id number] Group: [id number]  Patient knows to call the office with questions or concerns.  Bartholomew Crews, CPhT Specialty Pharmacy Patient [facility] Phone: [phone] Fax: [phone] 08/09/2019 4:41 PM

## 2019-08-10 NOTE — Progress Notes (Signed)
As expected CD4 quite low. Appropriate prophylactic medications were already prescribed. No changes.

## 2019-08-12 LAB — CYTOLOGY, (ORAL, ANAL, URETHRAL) ANCILLARY ONLY
Chlamydia: NEGATIVE
Chlamydia: NEGATIVE
Comment: NEGATIVE
Comment: NORMAL
Comment: NEGATIVE
Comment: NORMAL
Neisseria Gonorrhea: NEGATIVE
Neisseria Gonorrhea: NEGATIVE

## 2019-08-12 LAB — URINE CYTOLOGY ANCILLARY ONLY
Chlamydia: NEGATIVE
Comment: NEGATIVE
Comment: NORMAL
Neisseria Gonorrhea: POSITIVE — AB

## 2019-08-15 ENCOUNTER — Telehealth: Payer: Self-pay | Admitting: *Deleted

## 2019-08-15 NOTE — Telephone Encounter (Signed)
-----   Message from [place] Callas, NP sent at 08/12/2019  4:55 PM EDT ----- Patients urine is positive for gonorrhea. Please call to notify and have him come in for treatment with 250mg  IM ceftriaxone + 1gm azithromycin. No sex until treated. Would like him to notify recent sexual partners that they will need to seek testing/treatment at [facility].

## 2019-08-15 NOTE — Addendum Note (Signed)
Addended by: [place] Callas on: 08/15/2019 09:51 AM   Modules accepted: Orders

## 2019-08-15 NOTE — Telephone Encounter (Signed)
RN relayed results to patient.  He will come 10/20 2:00 for treatment per [facility].  RN transferred call to [facility] for questions regarding GI referral. Landis Gandy, RN

## 2019-08-15 NOTE — Telephone Encounter (Signed)
Thank you, kindly!   I re-entered his referral to be sent to [facility] being he was seen there in the past. Joycelyn Schmid is working on it.

## 2019-08-16 ENCOUNTER — Other Ambulatory Visit: Payer: Self-pay

## 2019-08-16 ENCOUNTER — Telehealth: Payer: Self-pay

## 2019-08-16 ENCOUNTER — Ambulatory Visit (INDEPENDENT_AMBULATORY_CARE_PROVIDER_SITE_OTHER): Payer: Non-veteran care | Admitting: Infectious Diseases

## 2019-08-16 ENCOUNTER — Encounter: Payer: Self-pay | Admitting: Infectious Diseases

## 2019-08-16 ENCOUNTER — Ambulatory Visit (INDEPENDENT_AMBULATORY_CARE_PROVIDER_SITE_OTHER): Payer: Non-veteran care

## 2019-08-16 DIAGNOSIS — R1319 Other dysphagia: Secondary | ICD-10-CM

## 2019-08-16 DIAGNOSIS — A549 Gonococcal infection, unspecified: Secondary | ICD-10-CM | POA: Diagnosis not present

## 2019-08-16 DIAGNOSIS — R634 Abnormal weight loss: Secondary | ICD-10-CM

## 2019-08-16 DIAGNOSIS — B20 Human immunodeficiency virus [HIV] disease: Secondary | ICD-10-CM | POA: Diagnosis not present

## 2019-08-16 DIAGNOSIS — Z8619 Personal history of other infectious and parasitic diseases: Secondary | ICD-10-CM

## 2019-08-16 DIAGNOSIS — A749 Chlamydial infection, unspecified: Secondary | ICD-10-CM

## 2019-08-16 DIAGNOSIS — G4700 Insomnia, unspecified: Secondary | ICD-10-CM | POA: Diagnosis not present

## 2019-08-16 DIAGNOSIS — R1310 Dysphagia, unspecified: Secondary | ICD-10-CM | POA: Diagnosis not present

## 2019-08-16 MED ORDER — CEFTRIAXONE SODIUM 250 MG IJ SOLR
250.0000 mg | Freq: Once | INTRAMUSCULAR | Status: AC
  Administered 2019-08-16: 250 mg via INTRAMUSCULAR

## 2019-08-16 MED ORDER — AZITHROMYCIN 600 MG PO TABS: 600.0000 mg | ORAL_TABLET | ORAL | 5 refills | Status: DC

## 2019-08-16 MED ORDER — AZITHROMYCIN 250 MG PO TABS
1000.0000 mg | ORAL_TABLET | Freq: Once | ORAL | Status: AC
  Administered 2019-08-16: 1000 mg via ORAL

## 2019-08-16 NOTE — Assessment & Plan Note (Signed)
Recently treated

## 2019-08-16 NOTE — Assessment & Plan Note (Signed)
We will send azithromycin once weekly to [facility] and see if his ADAP is ready to pick this up.  We will continue Bactrim and advised that he can continue to take this 3 times a week or once daily whatever is easiest for him.  Fluconazole as previously instructed.

## 2019-08-16 NOTE — Assessment & Plan Note (Addendum)
We spent 15 minutes discussing his lab work today in detail the natural progression of HIV treated versus untreated. Welcomed and answered all questions to his satisfaction today.  Encouraged his disclosure to all partners about HIV status and condom use.  He will return as scheduled for repeat lab work.
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
## 2019-08-16 NOTE — Patient Instructions (Addendum)
Nice to see you again today.   For your reference:   [email or web address]  [email or web address]   Will resend your azithromycin to [facility] - take two pills every 7 days until we tell you to stop.   If it helps you you can take the Bactrim once a day every day.    Please come back to see Colletta Maryland as currently scheduled.

## 2019-08-16 NOTE — Telephone Encounter (Signed)
Patient came into clinic today and is requesting to speak with Janene Madeira, NP regarding lab results. Scheduled patient to see Janene Madeira, NP today to review labs and review concerns. [place]

## 2019-08-16 NOTE — Assessment & Plan Note (Signed)
Improved taking nightly mirtazapine.  He will continue taking this.

## 2019-08-16 NOTE — Assessment & Plan Note (Signed)
He has not picked up the fluconazole.  He will try to pick this up again today as now his ADAP is hopefully improved.  I am not certain that he meets requirement for admission to the hospital right now given that his symptoms have improved.  I told him that his pain while uncomfortable is a chronic problem and we will continue to work with the [place] in regards to the recommended referral process to make this happen as soon as we can.  We discussed parameters that would warrant hospitalization today.

## 2019-08-16 NOTE — Assessment & Plan Note (Signed)
Appetite has improved with mirtazapine.

## 2019-08-30 LAB — COMPLETE METABOLIC PANEL WITH GFR
AG Ratio: 0.6 (calc) — ABNORMAL LOW (ref 1.0–2.5)
ALT: 22 U/L (ref 9–46)
AST: 48 U/L — ABNORMAL HIGH (ref 10–40)
Albumin: 3.6 g/dL (ref 3.6–5.1)
Alkaline phosphatase (APISO): 218 U/L — ABNORMAL HIGH (ref 36–130)
BUN/Creatinine Ratio: 5 (calc) — ABNORMAL LOW (ref 6–22)
BUN: 5 mg/dL — ABNORMAL LOW (ref 7–25)
CO2: 26 mmol/L (ref 20–32)
Calcium: 9.8 mg/dL (ref 8.6–10.3)
Chloride: 97 mmol/L — ABNORMAL LOW (ref 98–110)
Creat: 0.95 mg/dL (ref 0.60–1.35)
GFR, Est African American: 116 mL/min/{1.73_m2} (ref >=60)
GFR, Est Non African American: 100 mL/min/{1.73_m2} (ref >=60)
Globulin: 5.9 g/dL (calc) — ABNORMAL HIGH (ref 1.9–3.7)
Glucose, Bld: 98 mg/dL (ref 65–99)
Potassium: 5.1 mmol/L (ref 3.5–5.3)
Sodium: 131 mmol/L — ABNORMAL LOW (ref 135–146)
Total Bilirubin: 0.6 mg/dL (ref 0.2–1.2)
Total Protein: 9.5 g/dL — ABNORMAL HIGH (ref 6.1–8.1)

## 2019-08-30 LAB — CBC WITH DIFFERENTIAL/PLATELET
Absolute Monocytes: 571 cells/uL (ref 200–950)
Basophils Absolute: 58 cells/uL (ref 0–200)
Basophils Relative: 1.2 %
Eosinophils Absolute: 149 cells/uL (ref 15–500)
Eosinophils Relative: 3.1 %
HCT: 47.6 % (ref 38.5–50.0)
Hemoglobin: 16.6 g/dL (ref 13.2–17.1)
Lymphs Abs: 1219 cells/uL (ref 850–3900)
MCH: 33.6 pg — ABNORMAL HIGH (ref 27.0–33.0)
MCHC: 34.9 g/dL (ref 32.0–36.0)
MCV: 96.4 fL (ref 80.0–100.0)
MPV: 10.8 fL (ref 7.5–12.5)
Monocytes Relative: 11.9 %
Neutro Abs: 2803 cells/uL (ref 1500–7800)
Neutrophils Relative %: 58.4 %
Platelets: 130 10*3/uL — ABNORMAL LOW (ref 140–400)
RBC: 4.94 10*6/uL (ref 4.20–5.80)
RDW: 11.8 % (ref 11.0–15.0)
Total Lymphocyte: 25.4 %
WBC: 4.8 10*3/uL (ref 3.8–10.8)

## 2019-08-30 LAB — FLUORESCENT TREPONEMAL AB(FTA)-IGG-BLD: Fluorescent Treponemal ABS: REACTIVE — AB

## 2019-08-30 LAB — HIV-1 INTEGRASE GENOTYPE

## 2019-08-30 LAB — RPR TITER: RPR Titer: 1:4 {titer} — ABNORMAL HIGH

## 2019-08-30 LAB — RPR: RPR Ser Ql: REACTIVE — AB

## 2019-08-30 LAB — HIV-1 GENOTYPE: HIV-1 Genotype: DETECTED — AB

## 2019-08-30 LAB — HIV RNA, RTPCR W/R GT (RTI, PI,INT)
HIV 1 RNA Quant: 405000 copies/mL — ABNORMAL HIGH
HIV-1 RNA Quant, Log: 5.61 Log copies/mL — ABNORMAL HIGH

## 2019-09-30 ENCOUNTER — Telehealth: Payer: Self-pay

## 2019-09-30 NOTE — Telephone Encounter (Signed)
COVID-19 Pre-Screening Questions:09/30/19   Do you currently have a fever (>100 F), chills or unexplained body aches? NO   Are you currently experiencing new cough, shortness of breath, sore throat, runny nose? NO  .  Have you recently travelled outside the state of [place] in the last 14 days? NO .  Have you been in contact with someone that is currently pending confirmation of Covid19 testing or has been confirmed to have the Covid19 virus?  NO  **If the patient answers NO to ALL questions -  advise the patient to please call the clinic before coming to the office should any symptoms develop.     

## 2019-10-03 ENCOUNTER — Ambulatory Visit: Payer: Non-veteran care | Admitting: Infectious Diseases

## 2019-11-29 ENCOUNTER — Ambulatory Visit: Payer: Non-veteran care

## 2019-11-29 ENCOUNTER — Other Ambulatory Visit: Payer: Non-veteran care

## 2019-11-30 ENCOUNTER — Other Ambulatory Visit: Payer: Non-veteran care

## 2019-11-30 ENCOUNTER — Other Ambulatory Visit: Payer: Self-pay

## 2019-11-30 ENCOUNTER — Telehealth: Payer: Self-pay | Admitting: *Deleted

## 2019-11-30 ENCOUNTER — Ambulatory Visit: Payer: Self-pay

## 2019-11-30 DIAGNOSIS — B20 Human immunodeficiency virus [HIV] disease: Secondary | ICD-10-CM

## 2019-11-30 NOTE — Telephone Encounter (Signed)
Patient contacted clinic on 2/1 to make lab/financial/follow up appointments to return to care. He is here 2/3 for labs and financial, asked to speak with a nurse. He has multiple bumps all over his arms/legs/feet/head that appear to be bug bites without infection but crusting over. He is sleeping in a friends shed (does have a heater available).  He would like "all STI testing" today. RN reiterated that he was tested at his last visit for these and would have to follow up at the health department for testing. RN provided phone number, asked him to call at 8am tomorrow. He agreed.  His ADAP expired 07/27/2019 and has not been taking his floconazole, azithromycin or bactrim.  He states he is however still getting his Susanne Borders (unsure how he has Biktarvy as his patient assistance expired 11/2018).  He also reports coughing fits a few times a day that are so bad it makes him vomit. He speaks without breathlessness or distress, is not coughing during the discussion. He knows to go to the emergency room if he cannot catch his breath or if his symptoms worsen.  RN emphasized the fact that he MUST keep his appointment with Judeth Cornfield on 2/18. RN will follow labs. Andree Coss, RN

## 2019-12-01 LAB — T-HELPER CELL (CD4) - (RCID CLINIC ONLY)
CD4 % Helper T Cell: 6 % — ABNORMAL LOW (ref 33–65)
CD4 T Cell Abs: 92 /uL — ABNORMAL LOW (ref 400–1790)

## 2019-12-01 NOTE — Telephone Encounter (Signed)
Thank you for your help [name]. Agree with HD referral and labs for routine HIV care.  Will also try acyclovir 400 mg TID for 7-10 days given concern for hsv with description of painful oral ulcers. L

## 2019-12-02 LAB — CBC WITH DIFFERENTIAL/PLATELET
Absolute Monocytes: 483 cells/uL (ref 200–950)
Basophils Absolute: 51 cells/uL (ref 0–200)
Basophils Relative: 1.1 %
Eosinophils Absolute: 51 cells/uL (ref 15–500)
Eosinophils Relative: 1.1 %
HCT: 42.4 % (ref 38.5–50.0)
Hemoglobin: 15.4 g/dL (ref 13.2–17.1)
Lymphs Abs: 1840 cells/uL (ref 850–3900)
MCH: 35.2 pg — ABNORMAL HIGH (ref 27.0–33.0)
MCHC: 36.3 g/dL — ABNORMAL HIGH (ref 32.0–36.0)
MCV: 96.8 fL (ref 80.0–100.0)
MPV: 10.3 fL (ref 7.5–12.5)
Monocytes Relative: 10.5 %
Neutro Abs: 2176 cells/uL (ref 1500–7800)
Neutrophils Relative %: 47.3 %
Platelets: 129 10*3/uL — ABNORMAL LOW (ref 140–400)
RBC: 4.38 10*6/uL (ref 4.20–5.80)
RDW: 10.9 % — ABNORMAL LOW (ref 11.0–15.0)
Total Lymphocyte: 40 %
WBC: 4.6 10*3/uL (ref 3.8–10.8)

## 2019-12-02 LAB — COMPLETE METABOLIC PANEL WITH GFR
AG Ratio: 0.5 (calc) — ABNORMAL LOW (ref 1.0–2.5)
ALT: 32 U/L (ref 9–46)
AST: 82 U/L — ABNORMAL HIGH (ref 10–40)
Albumin: 3.3 g/dL — ABNORMAL LOW (ref 3.6–5.1)
Alkaline phosphatase (APISO): 273 U/L — ABNORMAL HIGH (ref 36–130)
BUN/Creatinine Ratio: 4 (calc) — ABNORMAL LOW (ref 6–22)
BUN: 3 mg/dL — ABNORMAL LOW (ref 7–25)
CO2: 24 mmol/L (ref 20–32)
Calcium: 8.7 mg/dL (ref 8.6–10.3)
Chloride: 93 mmol/L — ABNORMAL LOW (ref 98–110)
Creat: 0.77 mg/dL (ref 0.60–1.35)
GFR, Est African American: 132 mL/min/{1.73_m2} (ref >=60)
GFR, Est Non African American: 114 mL/min/{1.73_m2} (ref >=60)
Globulin: 6.3 g/dL (calc) — ABNORMAL HIGH (ref 1.9–3.7)
Glucose, Bld: 141 mg/dL — ABNORMAL HIGH (ref 65–99)
Potassium: 3.9 mmol/L (ref 3.5–5.3)
Sodium: 127 mmol/L — ABNORMAL LOW (ref 135–146)
Total Bilirubin: 0.6 mg/dL (ref 0.2–1.2)
Total Protein: 9.6 g/dL — ABNORMAL HIGH (ref 6.1–8.1)

## 2019-12-02 LAB — HIV-1 RNA QUANT-NO REFLEX-BLD
HIV 1 RNA Quant: <20 copies/mL
HIV-1 RNA Quant, Log: <1.3 Log copies/mL

## 2019-12-02 NOTE — Telephone Encounter (Signed)
Spoke with patient, relayed viral load and increase in CD4 - encouraged patient to keep up the good work. He states his lip area is feeling better, that the ulcers on his penis are drying up. He has not been able to make an appointment at the health department [place] they are not seeing patients). RN clarified with the health department, reinforced to patient that he needs to call at 8am in order to get an appointment, no later. He will do so. Andree Coss, RN

## 2019-12-09 ENCOUNTER — Ambulatory Visit (HOSPITAL_COMMUNITY)
Admission: EM | Admit: 2019-12-09 | Discharge: 2019-12-09 | Disposition: A | Discharge status: Home / Self Care | Payer: Non-veteran care | Attending: Family Medicine | Admitting: Family Medicine

## 2019-12-09 ENCOUNTER — Other Ambulatory Visit: Payer: Self-pay

## 2019-12-09 ENCOUNTER — Encounter (HOSPITAL_COMMUNITY): Payer: Self-pay

## 2019-12-09 DIAGNOSIS — B20 Human immunodeficiency virus [HIV] disease: Secondary | ICD-10-CM | POA: Diagnosis not present

## 2019-12-09 DIAGNOSIS — A539 Syphilis, unspecified: Secondary | ICD-10-CM | POA: Insufficient documentation

## 2019-12-09 MED ORDER — PENICILLIN G BENZATHINE 1200000 UNIT/2ML IM SUSP
2.4000 10*6.[IU] | Freq: Once | INTRAMUSCULAR | Status: AC
  Administered 2019-12-09: 2.4 10*6.[IU] via INTRAMUSCULAR

## 2019-12-09 MED ORDER — PENICILLIN G BENZATHINE 1200000 UNIT/2ML IM SUSP
INTRAMUSCULAR | Status: AC
  Filled 2019-12-09: qty 4
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
## 2019-12-09 NOTE — ED Triage Notes (Signed)
Pt presents with complaints of possible syphilis. States he has sores on his feet, head, penis and lip area. Reports first noticing them 10 days ago. States he has had syphilis in the past. States that a recent sexual partner thinks he may have also had it.

## 2019-12-09 NOTE — Discharge Instructions (Signed)
We will call with the results.  Please follow up with if your symptoms fail to improve.  Please have safe practices.

## 2019-12-10 LAB — RPR
RPR Ser Ql: REACTIVE — AB
RPR Titer: >=1:256 {titer}

## 2019-12-12 ENCOUNTER — Telehealth (HOSPITAL_COMMUNITY): Payer: Self-pay | Admitting: Emergency Medicine

## 2019-12-12 LAB — T.PALLIDUM AB, TOTAL: T Pallidum Abs: REACTIVE — AB

## 2019-12-12 NOTE — Telephone Encounter (Signed)
Patient contacted by phone and made aware of  syphilis  results. Pt verbalized understanding and had all questions answered.

## 2019-12-12 NOTE — Telephone Encounter (Signed)
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 Pt was treated with 2.4 million units of Bicillin. Attempted to reach patient. No answer at this time. Voicemail left.

## 2019-12-13 ENCOUNTER — Telehealth: Payer: Self-pay | Admitting: Family Medicine

## 2019-12-13 LAB — CYTOLOGY, (ORAL, ANAL, URETHRAL) ANCILLARY ONLY
Chlamydia: NEGATIVE
Neisseria Gonorrhea: NEGATIVE
Trichomonas: NEGATIVE

## 2019-12-13 NOTE — Telephone Encounter (Signed)
Informed of cytology results.   Myra Rude, MD [facility] 12/13/2019, 11:27 AM

## 2019-12-15 ENCOUNTER — Encounter: Payer: Non-veteran care | Admitting: Infectious Diseases

## 2019-12-22 ENCOUNTER — Encounter: Payer: Self-pay | Admitting: Infectious Diseases

## 2020-01-03 ENCOUNTER — Encounter: Payer: Non-veteran care | Admitting: Infectious Diseases

## 2020-01-30 ENCOUNTER — Ambulatory Visit: Payer: Non-veteran care | Admitting: Infectious Diseases

## 2020-02-01 ENCOUNTER — Other Ambulatory Visit: Payer: Self-pay | Admitting: Infectious Diseases

## 2020-02-01 DIAGNOSIS — B20 Human immunodeficiency virus [HIV] disease: Secondary | ICD-10-CM

## 2020-02-07 ENCOUNTER — Ambulatory Visit: Payer: Non-veteran care | Admitting: Infectious Diseases

## 2020-03-07 ENCOUNTER — Ambulatory Visit: Payer: Non-veteran care | Admitting: Pharmacist

## 2020-03-08 ENCOUNTER — Ambulatory Visit: Payer: Non-veteran care | Admitting: Pharmacist

## 2020-03-08 ENCOUNTER — Telehealth: Payer: Self-pay | Admitting: Pharmacy Technician

## 2020-03-08 NOTE — Telephone Encounter (Signed)
[facility] Patient Advocate Encounter    Findings of the benefits investigation:   Insurance: Trialcard with Laroy Apple still active Patient last filled 03/02/2020, next refill available is 03/23/2020  [facility] Patient Advocate will follow up once patient arrives for their appointment.  Beulah Gandy, CPhT [facility] Pharmacy Patient [facility] Phone: [phone] Fax: [phone] 03/08/2020 8:26 AM

## 2020-07-23 ENCOUNTER — Telehealth: Payer: Self-pay

## 2020-07-23 ENCOUNTER — Other Ambulatory Visit: Payer: Self-pay | Admitting: Infectious Diseases

## 2020-07-23 DIAGNOSIS — B20 Human immunodeficiency virus [HIV] disease: Secondary | ICD-10-CM

## 2020-07-23 NOTE — Telephone Encounter (Signed)
Called patient and left a voicemail for him to call back. Patient will need to schedule a follow up appointment and an appointment with Roe Coombs.  Elianne Gubser T Pricilla Loveless

## 2020-07-28 ENCOUNTER — Emergency Department (HOSPITAL_COMMUNITY): Payer: No Typology Code available for payment source

## 2020-07-28 ENCOUNTER — Encounter (HOSPITAL_COMMUNITY): Payer: Self-pay

## 2020-07-28 ENCOUNTER — Inpatient Hospital Stay (HOSPITAL_COMMUNITY)
Admission: EM | Admit: 2020-07-28 | Discharge: 2020-08-18 | DRG: 977 | Disposition: A | Discharge status: Home / Self Care | Payer: No Typology Code available for payment source | Attending: Internal Medicine | Admitting: Internal Medicine

## 2020-07-28 ENCOUNTER — Inpatient Hospital Stay (HOSPITAL_COMMUNITY): Payer: No Typology Code available for payment source

## 2020-07-28 ENCOUNTER — Other Ambulatory Visit: Payer: Self-pay

## 2020-07-28 DIAGNOSIS — E039 Hypothyroidism, unspecified: Secondary | ICD-10-CM | POA: Diagnosis present

## 2020-07-28 DIAGNOSIS — Z79899 Other long term (current) drug therapy: Secondary | ICD-10-CM

## 2020-07-28 DIAGNOSIS — Z6820 Body mass index (BMI) 20.0-20.9, adult: Secondary | ICD-10-CM

## 2020-07-28 DIAGNOSIS — K219 Gastro-esophageal reflux disease without esophagitis: Secondary | ICD-10-CM | POA: Diagnosis present

## 2020-07-28 DIAGNOSIS — E43 Unspecified severe protein-calorie malnutrition: Secondary | ICD-10-CM | POA: Diagnosis present

## 2020-07-28 DIAGNOSIS — E871 Hypo-osmolality and hyponatremia: Secondary | ICD-10-CM | POA: Diagnosis present

## 2020-07-28 DIAGNOSIS — R5381 Other malaise: Secondary | ICD-10-CM | POA: Diagnosis present

## 2020-07-28 DIAGNOSIS — E876 Hypokalemia: Secondary | ICD-10-CM | POA: Diagnosis present

## 2020-07-28 DIAGNOSIS — R29898 Other symptoms and signs involving the musculoskeletal system: Secondary | ICD-10-CM

## 2020-07-28 DIAGNOSIS — W19XXXA Unspecified fall, initial encounter: Secondary | ICD-10-CM | POA: Diagnosis present

## 2020-07-28 DIAGNOSIS — D539 Nutritional anemia, unspecified: Secondary | ICD-10-CM | POA: Diagnosis present

## 2020-07-28 DIAGNOSIS — M7989 Other specified soft tissue disorders: Secondary | ICD-10-CM

## 2020-07-28 DIAGNOSIS — E440 Moderate protein-calorie malnutrition: Secondary | ICD-10-CM | POA: Insufficient documentation

## 2020-07-28 DIAGNOSIS — Z23 Encounter for immunization: Secondary | ICD-10-CM

## 2020-07-28 DIAGNOSIS — R7401 Elevation of levels of liver transaminase levels: Secondary | ICD-10-CM | POA: Diagnosis present

## 2020-07-28 DIAGNOSIS — R946 Abnormal results of thyroid function studies: Secondary | ICD-10-CM | POA: Diagnosis present

## 2020-07-28 DIAGNOSIS — D7589 Other specified diseases of blood and blood-forming organs: Secondary | ICD-10-CM | POA: Diagnosis present

## 2020-07-28 DIAGNOSIS — G610 Guillain-Barre syndrome: Secondary | ICD-10-CM | POA: Diagnosis not present

## 2020-07-28 DIAGNOSIS — R590 Localized enlarged lymph nodes: Secondary | ICD-10-CM | POA: Diagnosis present

## 2020-07-28 DIAGNOSIS — L02419 Cutaneous abscess of limb, unspecified: Secondary | ICD-10-CM

## 2020-07-28 DIAGNOSIS — M79604 Pain in right leg: Secondary | ICD-10-CM | POA: Diagnosis not present

## 2020-07-28 DIAGNOSIS — B20 Human immunodeficiency virus [HIV] disease: Secondary | ICD-10-CM | POA: Diagnosis not present

## 2020-07-28 DIAGNOSIS — E872 Acidosis, unspecified: Secondary | ICD-10-CM

## 2020-07-28 DIAGNOSIS — L02412 Cutaneous abscess of left axilla: Secondary | ICD-10-CM | POA: Diagnosis not present

## 2020-07-28 DIAGNOSIS — Z87828 Personal history of other (healed) physical injury and trauma: Secondary | ICD-10-CM

## 2020-07-28 DIAGNOSIS — A530 Latent syphilis, unspecified as early or late: Secondary | ICD-10-CM | POA: Diagnosis present

## 2020-07-28 DIAGNOSIS — R296 Repeated falls: Secondary | ICD-10-CM | POA: Diagnosis present

## 2020-07-28 DIAGNOSIS — E861 Hypovolemia: Secondary | ICD-10-CM | POA: Diagnosis present

## 2020-07-28 DIAGNOSIS — R292 Abnormal reflex: Secondary | ICD-10-CM | POA: Diagnosis present

## 2020-07-28 DIAGNOSIS — D638 Anemia in other chronic diseases classified elsewhere: Secondary | ICD-10-CM | POA: Diagnosis present

## 2020-07-28 DIAGNOSIS — Z20822 Contact with and (suspected) exposure to covid-19: Secondary | ICD-10-CM | POA: Diagnosis present

## 2020-07-28 DIAGNOSIS — B957 Other staphylococcus as the cause of diseases classified elsewhere: Secondary | ICD-10-CM | POA: Diagnosis present

## 2020-07-28 DIAGNOSIS — Z8619 Personal history of other infectious and parasitic diseases: Secondary | ICD-10-CM

## 2020-07-28 DIAGNOSIS — F17210 Nicotine dependence, cigarettes, uncomplicated: Secondary | ICD-10-CM | POA: Diagnosis present

## 2020-07-28 LAB — CSF CELL COUNT WITH DIFFERENTIAL
RBC Count, CSF: 0 /mm3
RBC Count, CSF: 0 /mm3
Tube #: 1
Tube #: 4
WBC, CSF: 0 /mm3 (ref 0–5)
WBC, CSF: 0 /mm3 (ref 0–5)

## 2020-07-28 LAB — URINALYSIS, ROUTINE W REFLEX MICROSCOPIC
Bacteria, UA: NONE SEEN
Glucose, UA: NEGATIVE mg/dL
Hgb urine dipstick: NEGATIVE
Ketones, ur: NEGATIVE mg/dL
Nitrite: NEGATIVE
Protein, ur: NEGATIVE mg/dL
Specific Gravity, Urine: 1.012 (ref 1.005–1.030)
pH: 6 (ref 5.0–8.0)

## 2020-07-28 LAB — BLOOD GAS, ARTERIAL
Acid-Base Excess: 4 mmol/L — ABNORMAL HIGH (ref 0.0–2.0)
Bicarbonate: 26.5 mmol/L (ref 20.0–28.0)
FIO2: 21
O2 Saturation: 96.3 %
Patient temperature: 98.6
pCO2 arterial: 32.6 mmHg (ref 32.0–48.0)
pH, Arterial: 7.521 — ABNORMAL HIGH (ref 7.350–7.450)
pO2, Arterial: 84.3 mmHg (ref 83.0–108.0)

## 2020-07-28 LAB — TSH: TSH: 1.886 u[IU]/mL (ref 0.350–4.500)

## 2020-07-28 LAB — LACTIC ACID, PLASMA
Lactic Acid, Venous: 2.7 mmol/L (ref 0.5–1.9)
Lactic Acid, Venous: 4.5 mmol/L (ref 0.5–1.9)
Lactic Acid, Venous: 2.4 mmol/L (ref 0.5–1.9)

## 2020-07-28 LAB — CBC WITH DIFFERENTIAL/PLATELET
Abs Immature Granulocytes: 0.04 10*3/uL (ref 0.00–0.07)
Basophils Absolute: 0 10*3/uL (ref 0.0–0.1)
Basophils Relative: 0 %
Eosinophils Absolute: 0.1 10*3/uL (ref 0.0–0.5)
Eosinophils Relative: 1 %
HCT: 33.4 % — ABNORMAL LOW (ref 39.0–52.0)
Hemoglobin: 11.5 g/dL — ABNORMAL LOW (ref 13.0–17.0)
Immature Granulocytes: 1 %
Lymphocytes Relative: 26 %
Lymphs Abs: 1.8 10*3/uL (ref 0.7–4.0)
MCH: 35.3 pg — ABNORMAL HIGH (ref 26.0–34.0)
MCHC: 34.4 g/dL (ref 30.0–36.0)
MCV: 102.5 fL — ABNORMAL HIGH (ref 80.0–100.0)
Monocytes Absolute: 0.4 10*3/uL (ref 0.1–1.0)
Monocytes Relative: 6 %
Neutro Abs: 4.6 10*3/uL (ref 1.7–7.7)
Neutrophils Relative %: 66 %
Platelets: 195 10*3/uL (ref 150–400)
RBC: 3.26 MIL/uL — ABNORMAL LOW (ref 4.22–5.81)
RDW: 15 % (ref 11.5–15.5)
WBC: 6.9 10*3/uL (ref 4.0–10.5)
nRBC: 0 % (ref 0.0–0.2)

## 2020-07-28 LAB — RAPID URINE DRUG SCREEN, HOSP PERFORMED
Amphetamines: NOT DETECTED
Barbiturates: NOT DETECTED
Benzodiazepines: NOT DETECTED
Cocaine: NOT DETECTED
Opiates: NOT DETECTED
Tetrahydrocannabinol: NOT DETECTED

## 2020-07-28 LAB — COMPREHENSIVE METABOLIC PANEL
ALT: 41 U/L (ref 0–44)
AST: 98 U/L — ABNORMAL HIGH (ref 15–41)
Albumin: 2.4 g/dL — ABNORMAL LOW (ref 3.5–5.0)
Alkaline Phosphatase: 190 U/L — ABNORMAL HIGH (ref 38–126)
Anion gap: 12 (ref 5–15)
BUN: 7 mg/dL (ref 6–20)
CO2: 23 mmol/L (ref 22–32)
Calcium: 7.9 mg/dL — ABNORMAL LOW (ref 8.9–10.3)
Chloride: 95 mmol/L — ABNORMAL LOW (ref 98–111)
Creatinine, Ser: 0.64 mg/dL (ref 0.61–1.24)
GFR calc Af Amer: >60 mL/min (ref >60)
GFR calc non Af Amer: >60 mL/min (ref >60)
Glucose, Bld: 73 mg/dL (ref 70–99)
Potassium: 3.8 mmol/L (ref 3.5–5.1)
Sodium: 130 mmol/L — ABNORMAL LOW (ref 135–145)
Total Bilirubin: 2.4 mg/dL — ABNORMAL HIGH (ref 0.3–1.2)
Total Protein: 8.2 g/dL — ABNORMAL HIGH (ref 6.5–8.1)

## 2020-07-28 LAB — SEDIMENTATION RATE: Sed Rate: 98 mm/hr — ABNORMAL HIGH (ref 0–16)

## 2020-07-28 LAB — CK: Total CK: 122 U/L (ref 49–397)

## 2020-07-28 LAB — MAGNESIUM: Magnesium: 1.8 mg/dL (ref 1.7–2.4)

## 2020-07-28 LAB — PROTEIN AND GLUCOSE, CSF
Glucose, CSF: 46 mg/dL (ref 40–70)
Total  Protein, CSF: 31 mg/dL (ref 15–45)

## 2020-07-28 LAB — C-REACTIVE PROTEIN: CRP: 3.6 mg/dL — ABNORMAL HIGH (ref <1.0)

## 2020-07-28 IMAGING — MR MR THORACIC SPINE WO/W CM
7 of 8 series · 33 of 48 positions shown · IV contrast (gadavist)
Comparison: None.

CLINICAL DATA: Lower extremity pain bilaterally.  HIV/AIDS

EXAM:
MRI CERVICAL, THORACIC AND LUMBAR SPINE WITHOUT AND WITH CONTRAST
TECHNIQUE: Multiplanar and multiecho pulse sequences of the cervical spine, to
include the craniocervical junction and cervicothoracic junction,
and thoracic and lumbar spine, were obtained without and with
intravenous contrast.
CONTRAST:  7mL GADAVIST GADOBUTROL 1 MMOL/ML IV SOLN

[Series 40: STIR · sagittal · 3.0mm · 1.00mm/px · 4 of 17 slices shown]
[im 1/17]
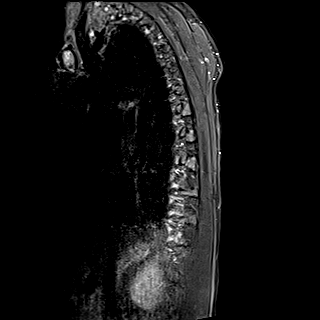
[im 6/17]
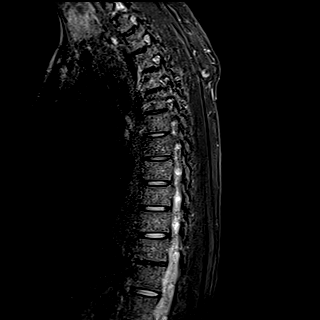
[im 11/17]
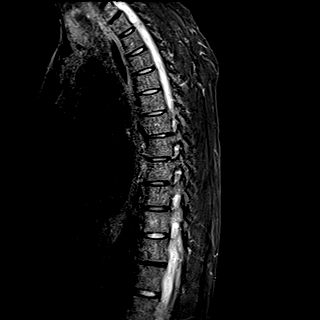
[im 17/17]
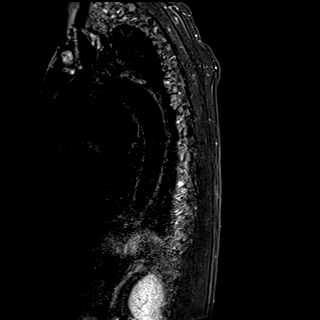

[Series 41: T1 · sagittal · 3.0mm · 1.00mm/px · 4 of 17 slices shown (1 of 2)]
[im 1/17]
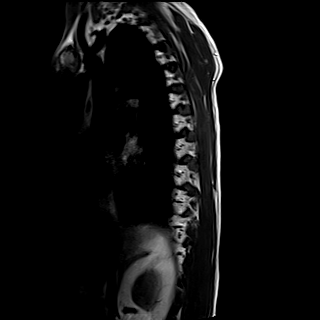
[im 6/17]
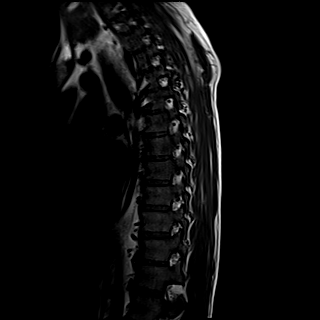
[im 11/17]
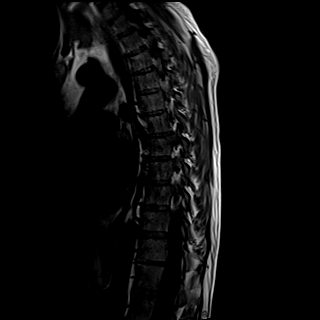
[im 17/17]
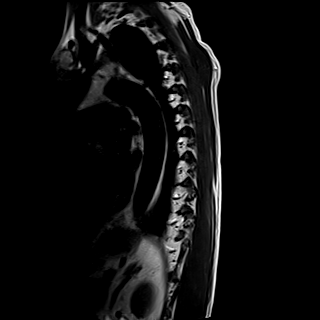

[Series 42: T2 · axial · 4.0mm · 0.78mm/px · z∈[-396,-165]mm · 8 of 41 slices shown]
[im 1/41]
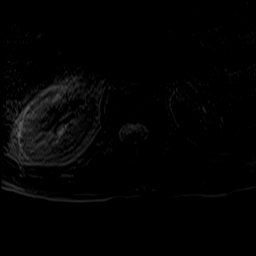
[im 6/41]
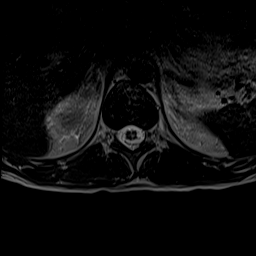
[im 12/41]
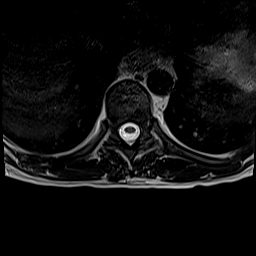
[im 18/41]
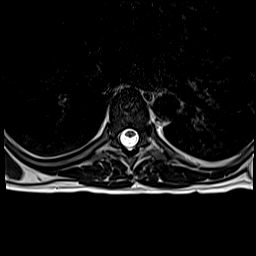
[im 23/41]
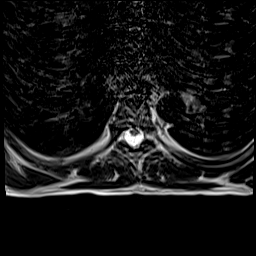
[im 29/41]
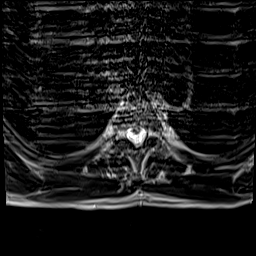
[im 35/41]
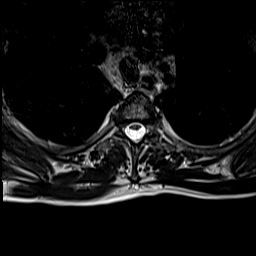
[im 41/41]
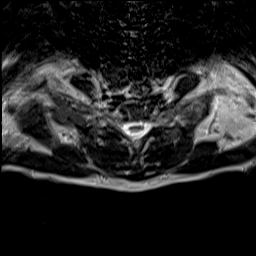

[Series 44: T1 · axial · 4.0mm · 0.39mm/px · z∈[-396,-165]mm · 8 of 41 slices shown (2 of 2)]
[im 1/41]
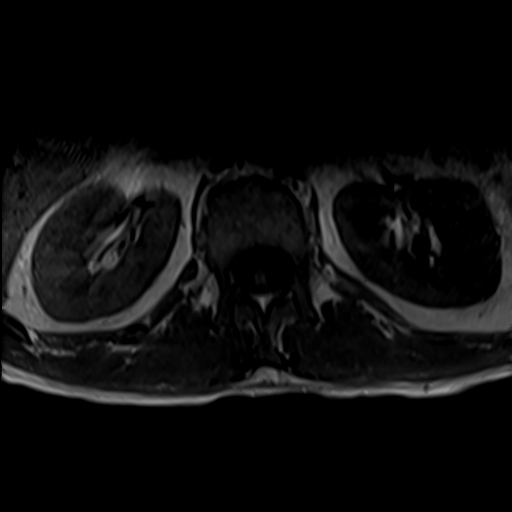
[im 6/41]
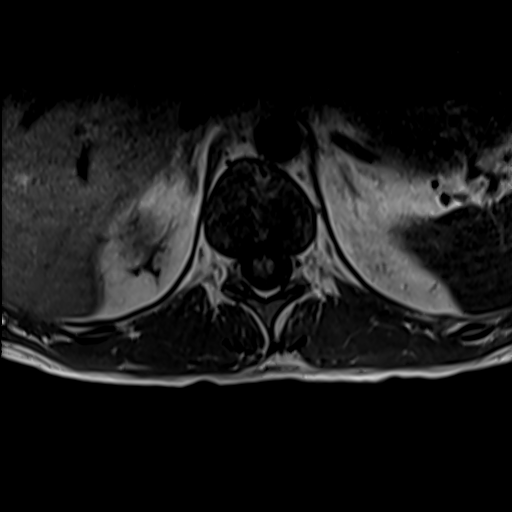
[im 12/41]
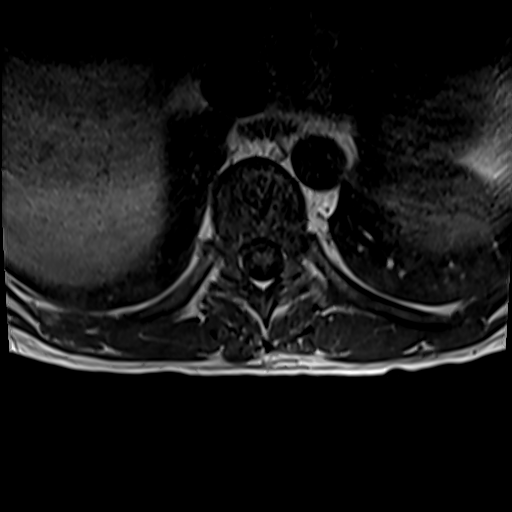
[im 18/41]
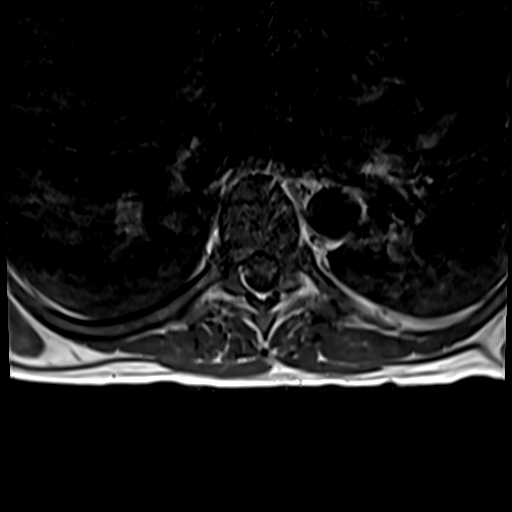
[im 23/41]
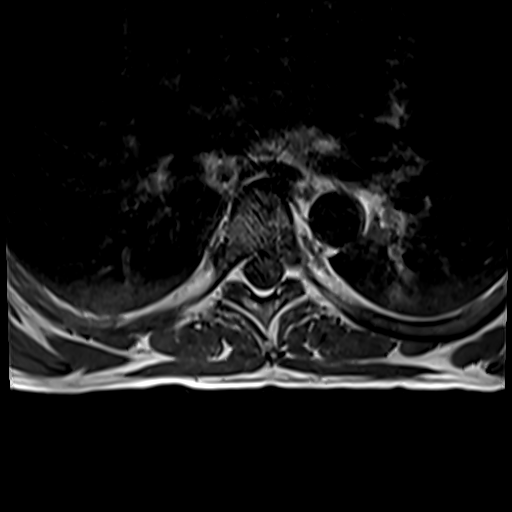
[im 29/41]
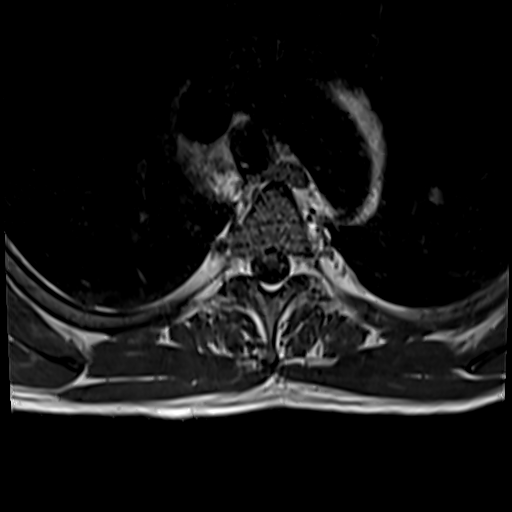
[im 35/41]
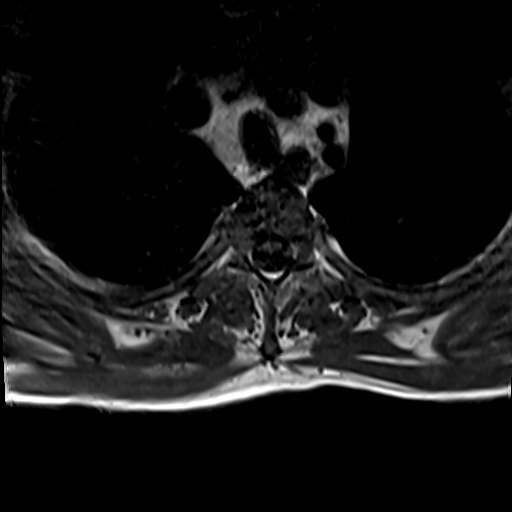
[im 41/41]
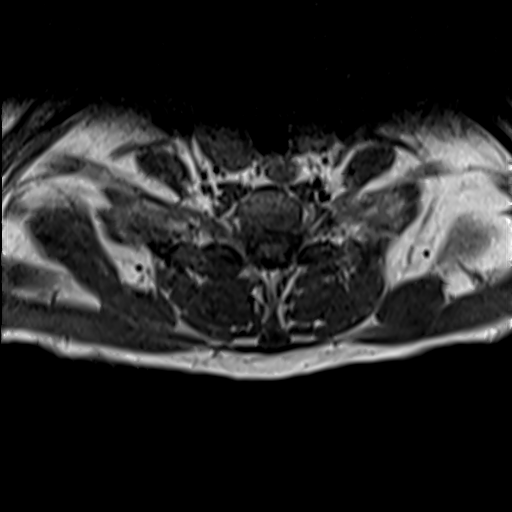

[Series 62: T2 post-contrast · sagittal · 3.0mm · 0.83mm/px · 4 of 19 slices shown]
[im 1/19]
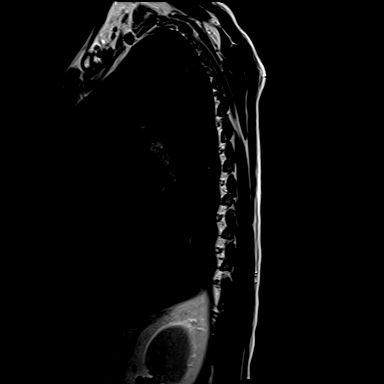
[im 7/19]
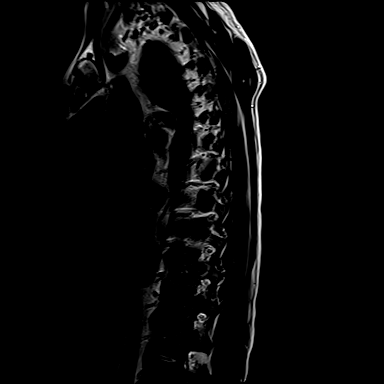
[im 13/19]
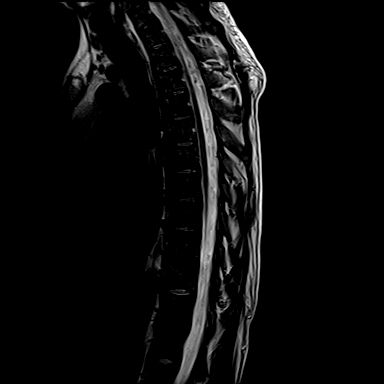
[im 19/19]
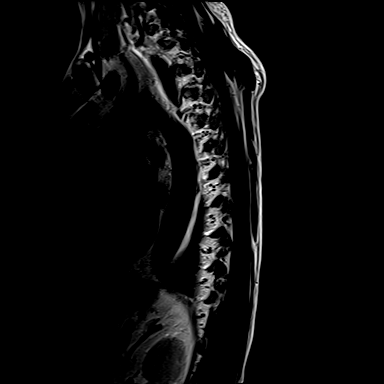

[Series 63: T1 fat-sat post-contrast · sagittal · 3.0mm · 1.00mm/px · 4 of 19 slices shown]
[im 1/19]
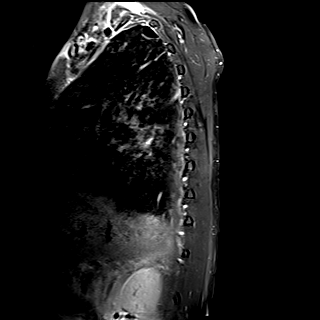
[im 7/19]
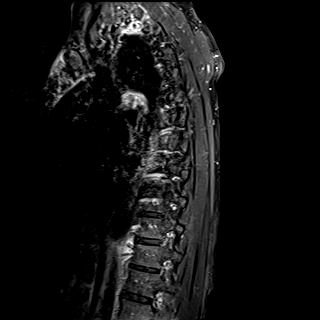
[im 13/19]
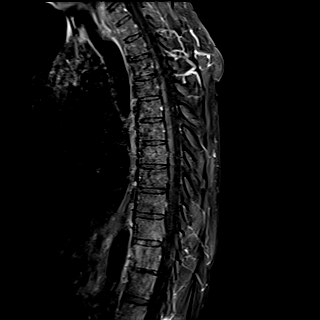
[im 19/19]
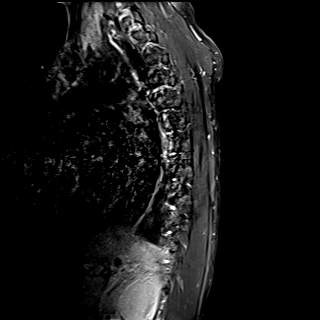

[Series 64: T1 post-contrast · axial · 4.0mm · 0.39mm/px · 1 of 41 slices shown]
[im 1/41]
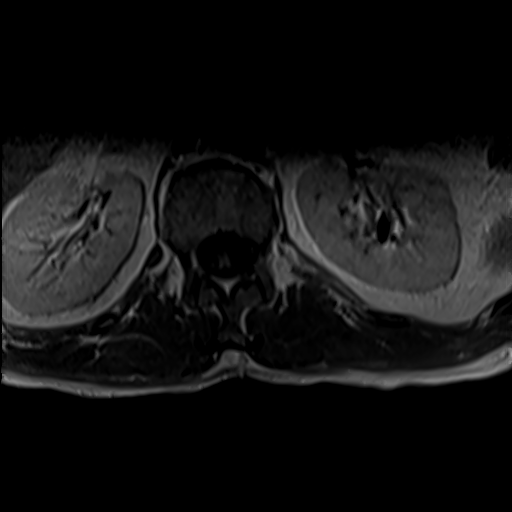

[33 of 48 positions shown; findings below may reference images not displayed]

FINDINGS: MRI CERVICAL SPINE FINDINGS

Alignment: Normal

Vertebrae: No fracture, evidence of discitis, or bone lesion.

Cord: Normal

Posterior Fossa, vertebral arteries, paraspinal tissues: Negative.

Disc levels:

There is no disc herniation. No spinal canal or neural foraminal
stenosis. No abnormal contrast enhancement.

MRI THORACIC SPINE FINDINGS

Alignment:  Physiologic.

Vertebrae: No fracture, evidence of discitis, or bone lesion.

Cord:  Normal signal and morphology.

Paraspinal and other soft tissues: Negative.

Disc levels:

No disc herniation, spinal canal stenosis or neural foraminal
stenosis. No abnormal contrast enhancement.

MRI LUMBAR SPINE FINDINGS

Segmentation:  Standard

Alignment:  Normal

Vertebrae:  No fracture, evidence of discitis, or bone lesion.

Conus medullaris and cauda equina: Conus extends to the L1 level.
Conus and cauda equina appear normal.

Paraspinal and other soft tissues: Negative

Disc levels:

All lumbar levels are normal. There is no disc herniation, spinal
canal stenosis or neural foraminal stenosis. No abnormal contrast
enhancement.
IMPRESSION: Normal MRI of the cervical, thoracic and lumbar spine.

## 2020-07-28 IMAGING — MR MR HEAD W/O CM
11 series · 45 of 48 positions shown · non-contrast
Comparison: None.

CLINICAL DATA: Motor neuron disease.  Lower extremity pain.

EXAM:
MRI HEAD WITHOUT CONTRAST
TECHNIQUE: Multiplanar, multiecho pulse sequences of the brain and surrounding
structures were obtained without intravenous contrast.

[Series 5: dwi_tracew · axial · 3.0mm · 1.08mm/px · z∈[-20,+139]mm · 8 of 108 slices shown]
[im 1/108]
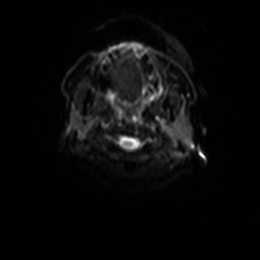
[im 22/108]
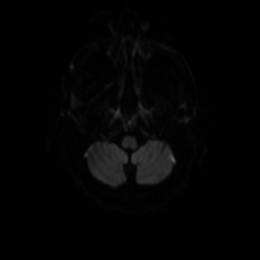
[im 33/108]
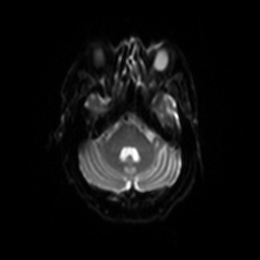
[im 43/108]
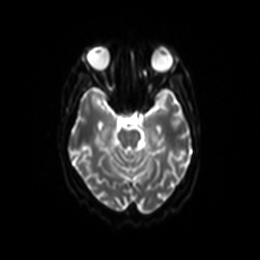
[im 65/108]
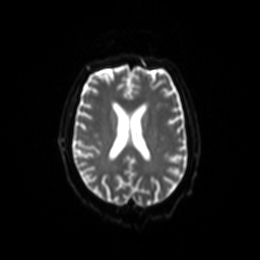
[im 75/108]
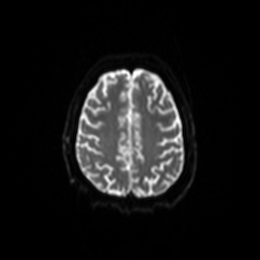
[im 86/108]
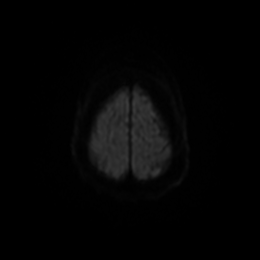
[im 108/108]
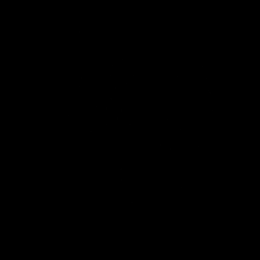

[Series 6: dwi_adc · axial · 3.0mm · 1.08mm/px · z∈[-20,+136]mm · 5 of 53 slices shown]
[im 1/53]
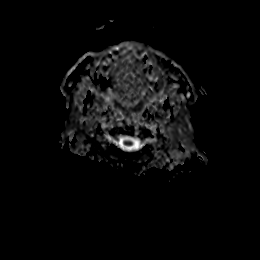
[im 14/53]
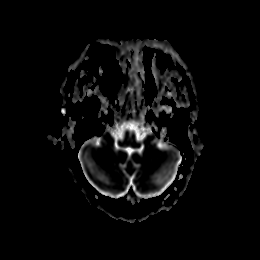
[im 27/53]
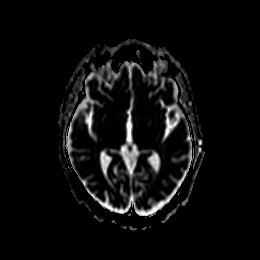
[im 40/53]
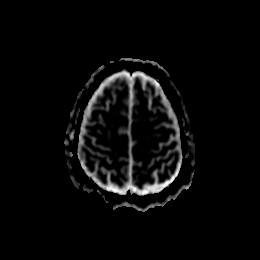
[im 53/53]
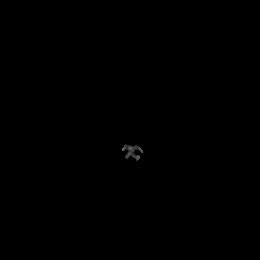

[Series 7: T2 · sagittal · 5.0mm · 0.47mm/px · 2 of 24 slices shown (1 of 3)]
[im 1/24]
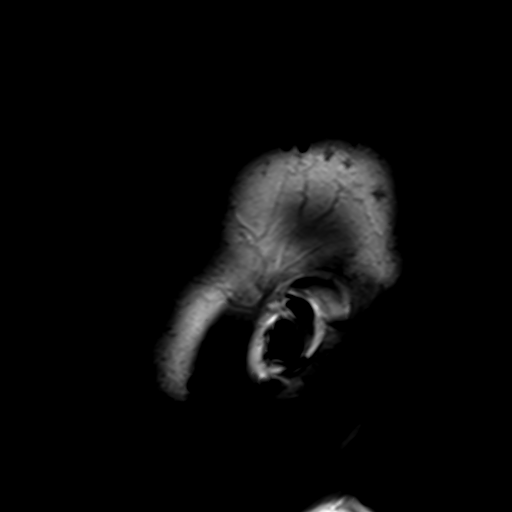
[im 24/24]
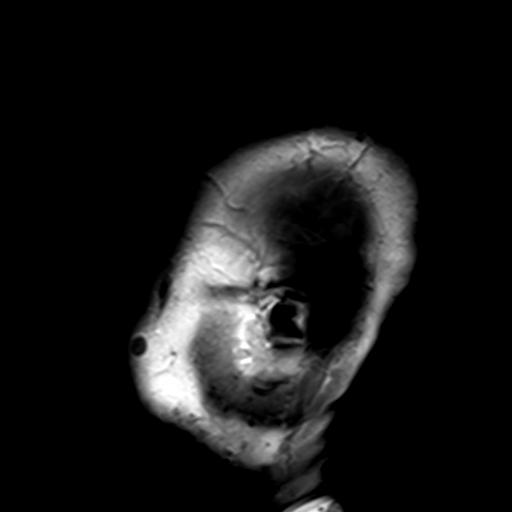

[Series 8: T2 · axial · 5.0mm · 0.45mm/px · z∈[-20,+136]mm · 2 of 25 slices shown (2 of 3)]
[im 1/25]
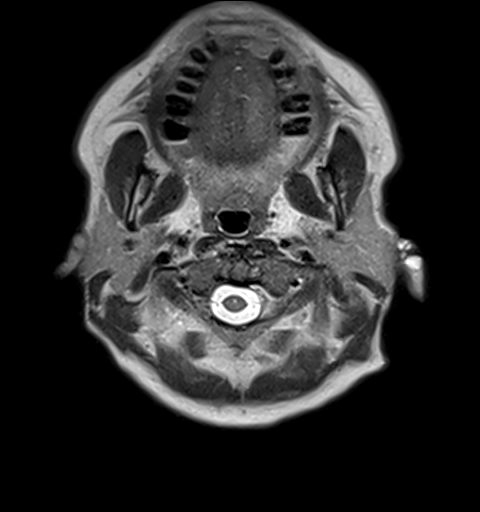
[im 25/25]
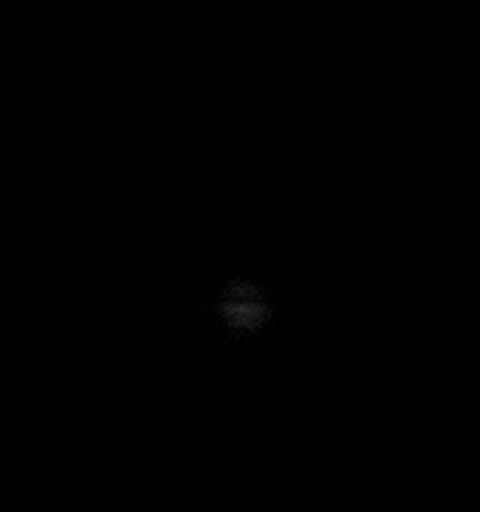

[Series 9: GRE · axial · 3.0mm · 0.45mm/px · z∈[-22,+137]mm · 5 of 54 slices shown]
[im 1/54]
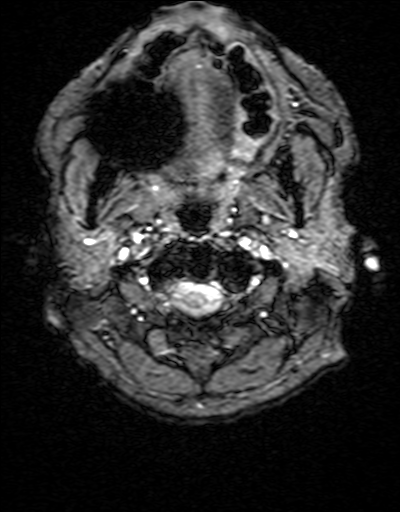
[im 14/54]
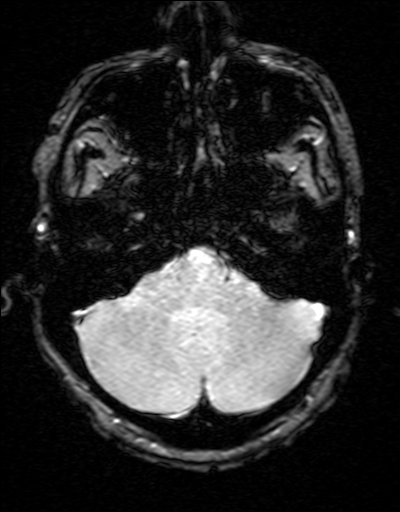
[im 27/54]
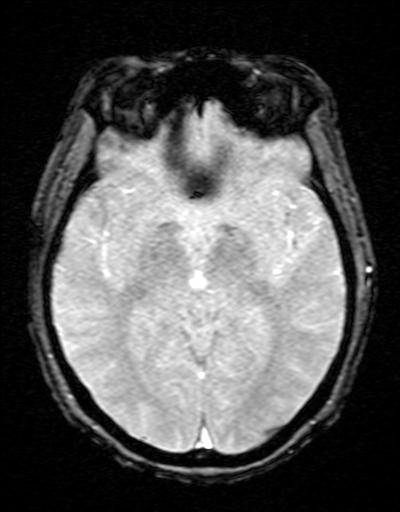
[im 40/54]
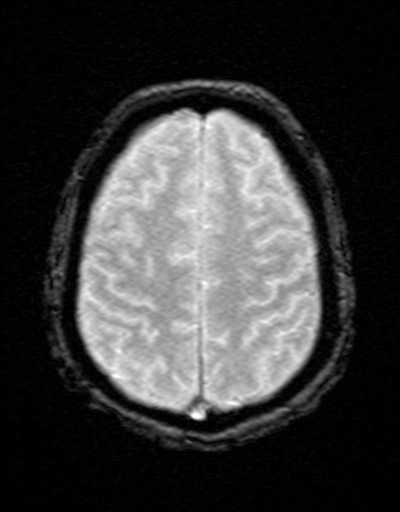
[im 54/54]
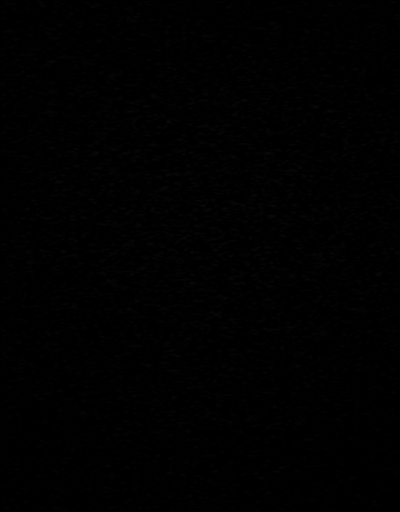

[Series 10: FLAIR · axial · 3.0mm · 0.86mm/px · z∈[-17,+133]mm · 5 of 51 slices shown]
[im 1/51]
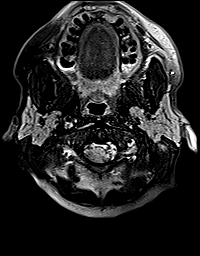
[im 13/51]
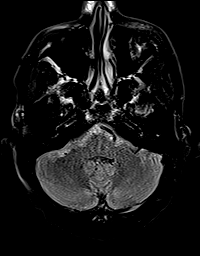
[im 26/51]
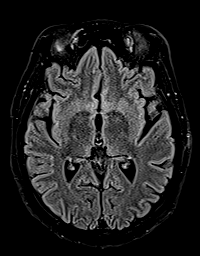
[im 38/51]
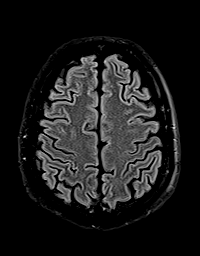
[im 51/51]
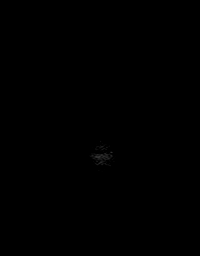

[Series 11: T1 · axial · 3.0mm · 0.45mm/px · z∈[-22,+137]mm · 5 of 54 slices shown]
[im 1/54]
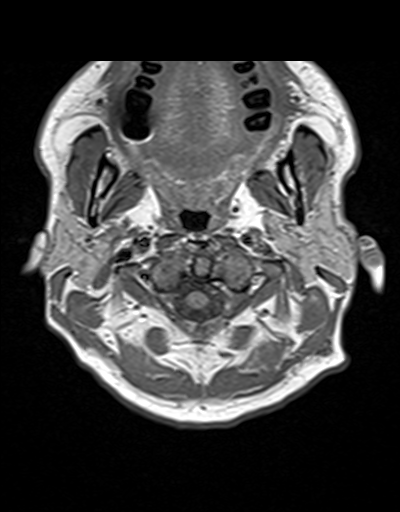
[im 14/54]
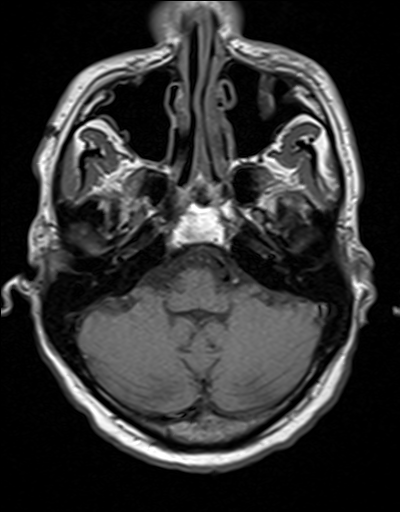
[im 27/54]
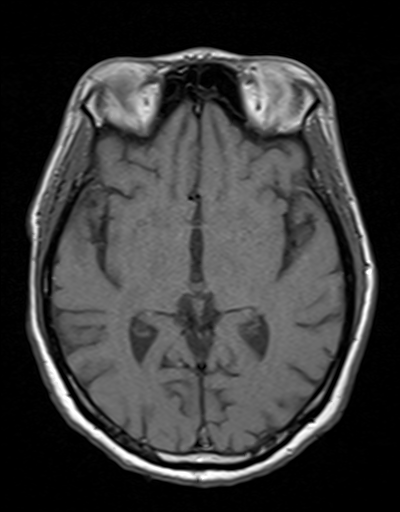
[im 40/54]
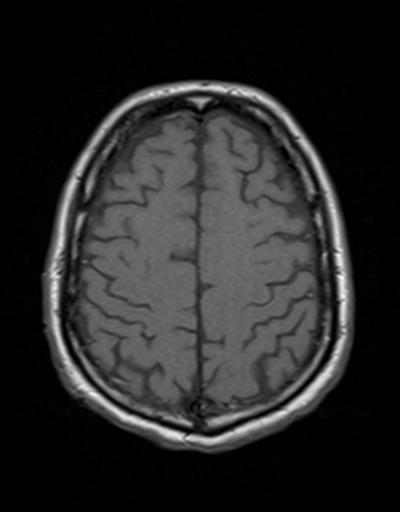
[im 54/54]
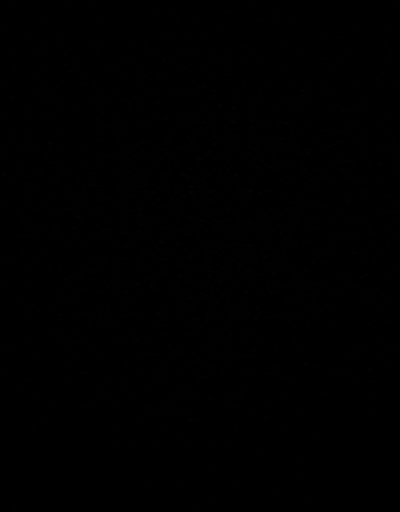

[Series 12: DWI · coronal · 5.0mm · 1.31mm/px · 6 of 60 slices shown (1 of 2)]
[im 1/60]
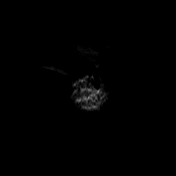
[im 12/60]
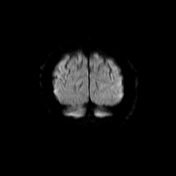
[im 24/60]
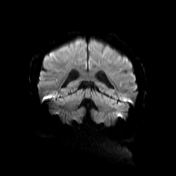
[im 36/60]
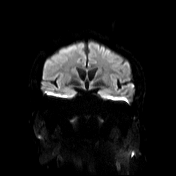
[im 48/60]
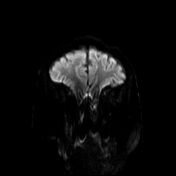
[im 60/60]
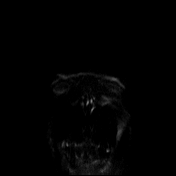

[Series 13: DWI · coronal · 5.0mm · 1.31mm/px · 3 of 30 slices shown (2 of 2)]
[im 1/30]
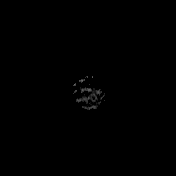
[im 15/30]
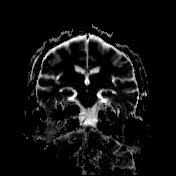
[im 30/30]
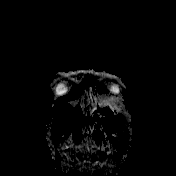

[Series 14: T2 · coronal · 5.0mm · 0.86mm/px · 3 of 30 slices shown (3 of 3)]
[im 1/30]
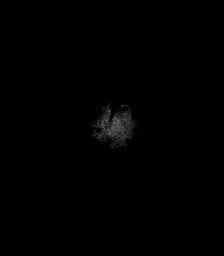
[im 15/30]
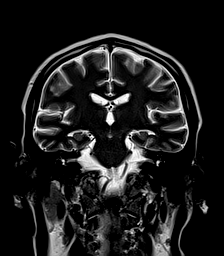
[im 30/30]
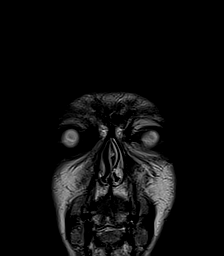

[Series 20: STIR · sagittal · 3.0mm · 0.78mm/px · 1 of 15 slices shown]
[im 1/15]
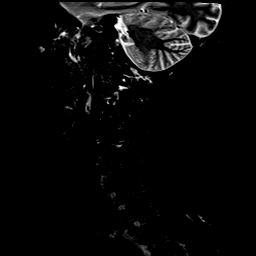

[45 of 48 positions shown; findings below may reference images not displayed]

FINDINGS: BRAIN: No acute infarct, acute hemorrhage or extra-axial collection.
Normal white matter signal. Normal volume of CSF spaces. No chronic
microhemorrhage. Normal midline structures.

VASCULAR: Major flow voids are preserved.

SKULL AND UPPER CERVICAL SPINE: Normal calvarium and skull base.
Visualized upper cervical spine and soft tissues are normal.

SINUSES/ORBITS: No paranasal sinus fluid levels or advanced mucosal
thickening. No mastoid or middle ear effusion. Normal orbits.
IMPRESSION: Normal brain MRI.

## 2020-07-28 IMAGING — MR MR CERVICAL SPINE WO/W CM
7 series · 36 of 48 positions shown · IV contrast (gadavist)
Comparison: None.

CLINICAL DATA: Lower extremity pain bilaterally.  HIV/AIDS

EXAM:
MRI CERVICAL, THORACIC AND LUMBAR SPINE WITHOUT AND WITH CONTRAST
TECHNIQUE: Multiplanar and multiecho pulse sequences of the cervical spine, to
include the craniocervical junction and cervicothoracic junction,
and thoracic and lumbar spine, were obtained without and with
intravenous contrast.
CONTRAST:  7mL GADAVIST GADOBUTROL 1 MMOL/ML IV SOLN

[Series 19: T1 · sagittal · 3.0mm · 0.62mm/px · 5 of 15 slices shown (1 of 2)]
[im 1/15]
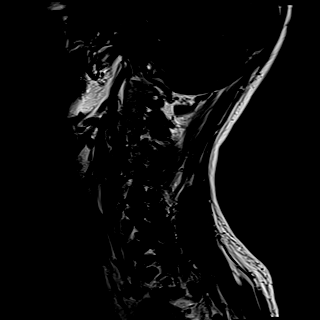
[im 4/15]
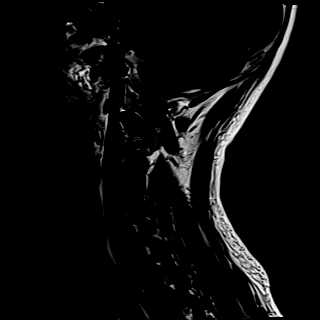
[im 8/15]
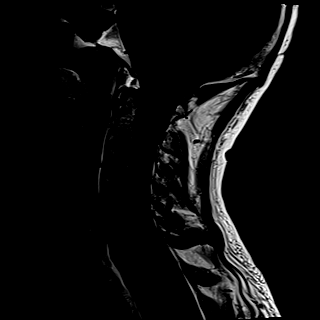
[im 11/15]
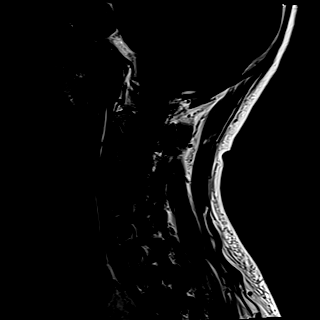
[im 15/15]
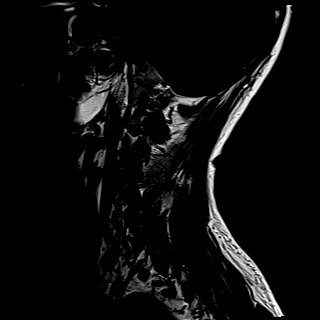

[Series 20: STIR · sagittal · 3.0mm · 0.78mm/px · 4 of 15 slices shown]
[im 1/15]
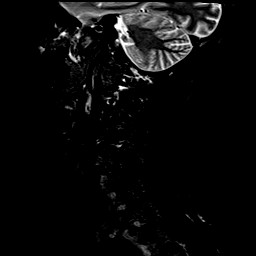
[im 5/15]
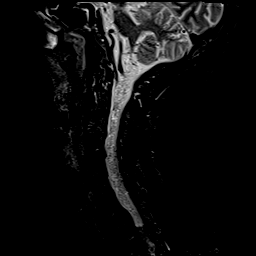
[im 10/15]
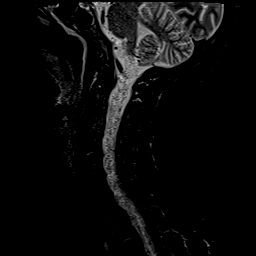
[im 15/15]
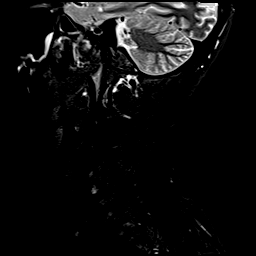

[Series 21: T2 · axial · 3.0mm · 0.70mm/px · z∈[-163,-54]mm · 8 of 33 slices shown]
[im 1/33]
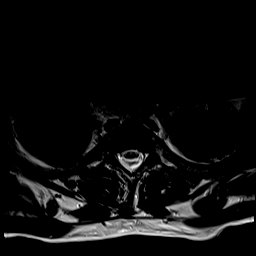
[im 4/33]
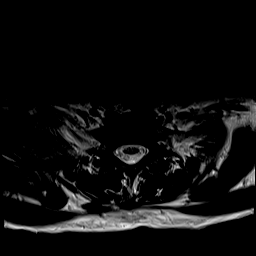
[im 11/33]
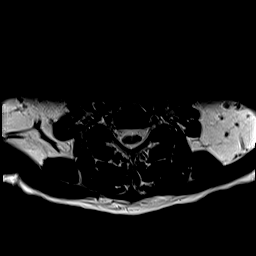
[im 15/33]
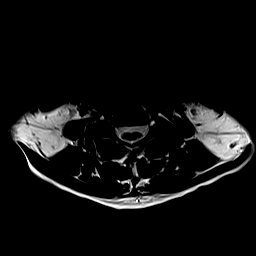
[im 18/33]
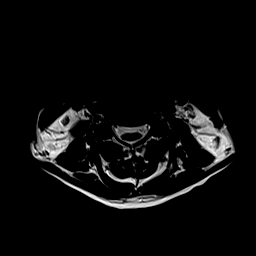
[im 22/33]
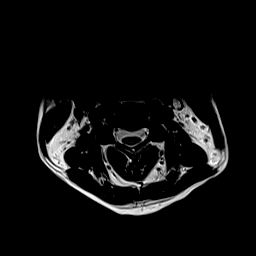
[im 29/33]
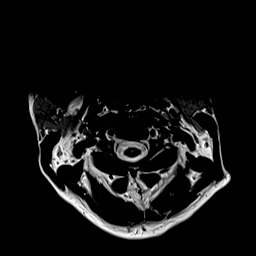
[im 33/33]
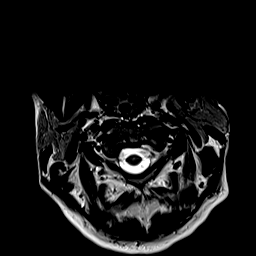

[Series 23: T1 · axial · 3.0mm · 0.35mm/px · z∈[-163,-54]mm · 8 of 33 slices shown (2 of 2)]
[im 1/33]
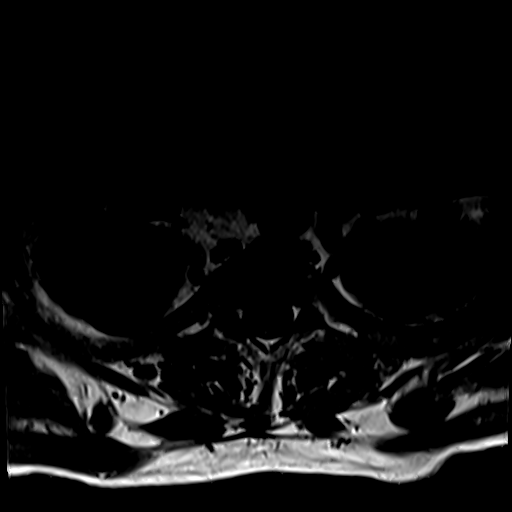
[im 4/33]
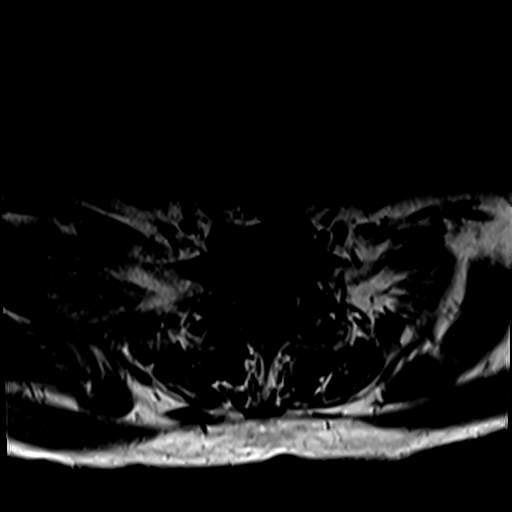
[im 11/33]
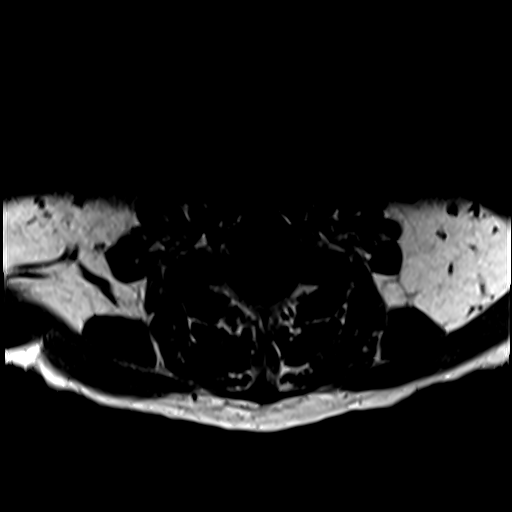
[im 15/33]
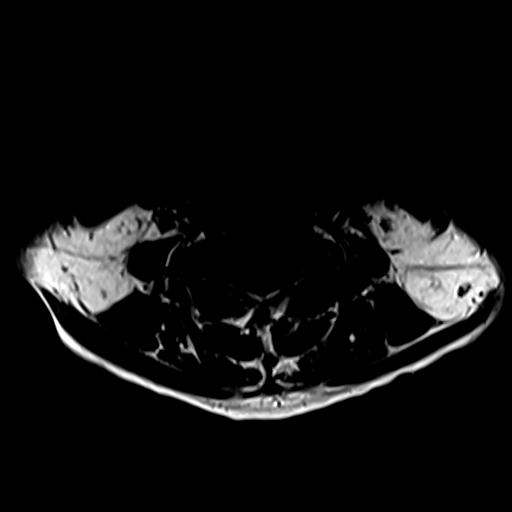
[im 18/33]
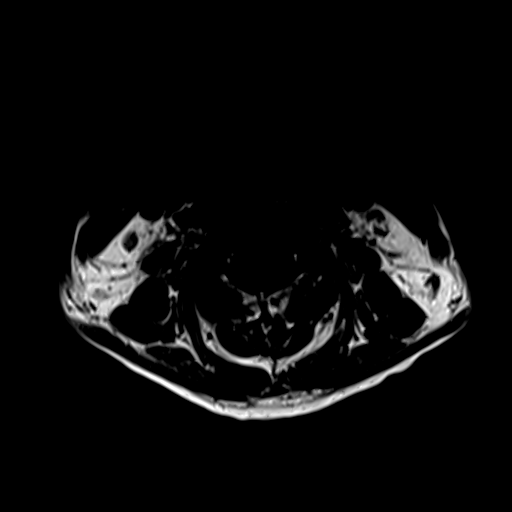
[im 22/33]
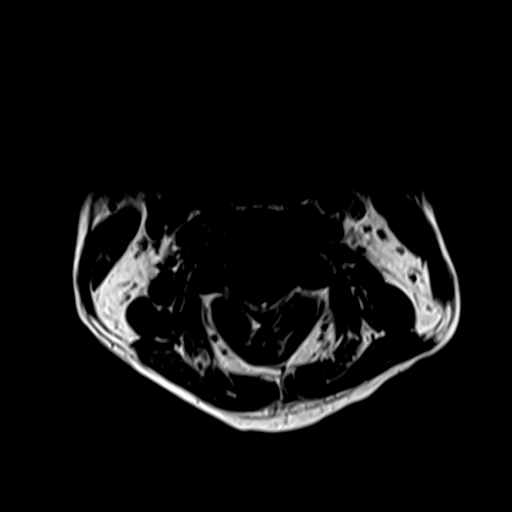
[im 29/33]
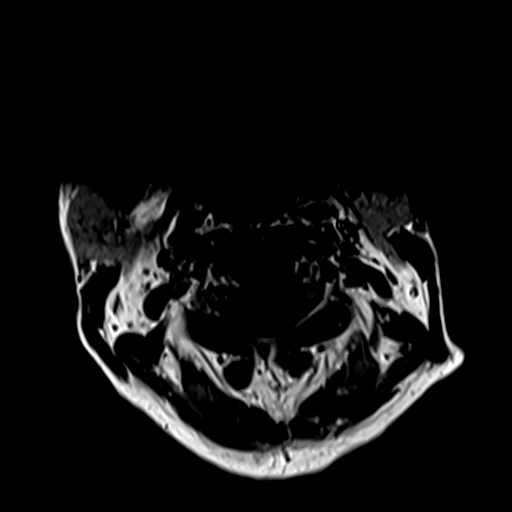
[im 33/33]
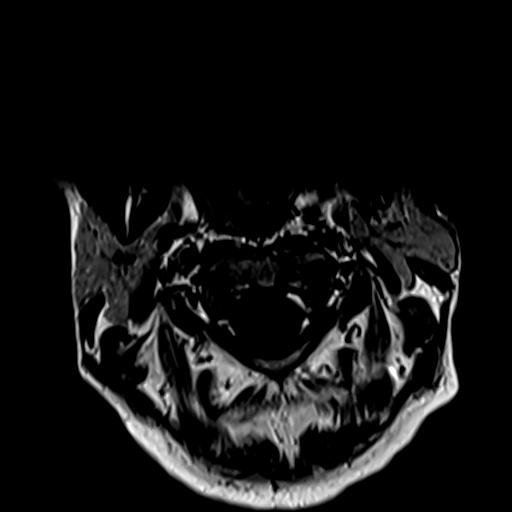

[Series 65: T2 post-contrast · sagittal · 3.0mm · 0.62mm/px · 5 of 17 slices shown]
[im 1/17]
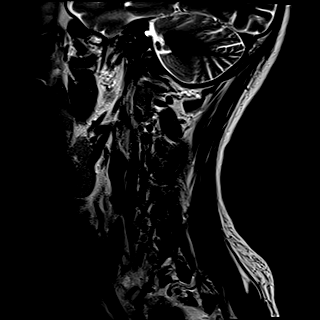
[im 5/17]
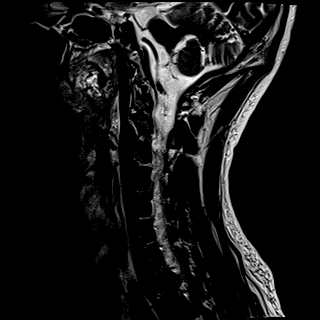
[im 9/17]
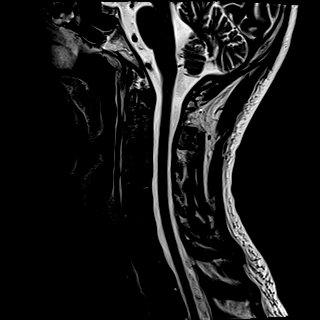
[im 13/17]
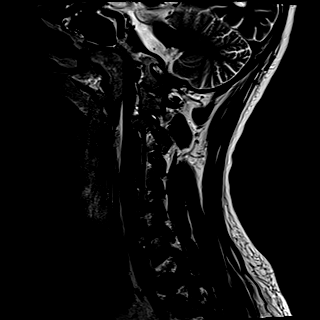
[im 17/17]
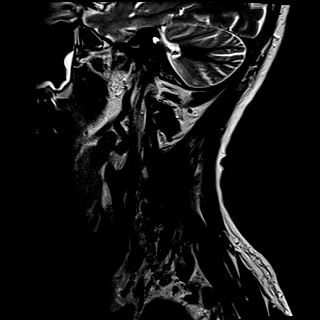

[Series 66: T1 fat-sat post-contrast · sagittal · 3.0mm · 0.62mm/px · 5 of 17 slices shown]
[im 1/17]
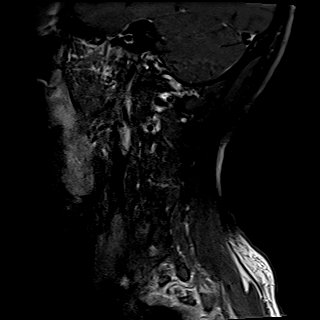
[im 5/17]
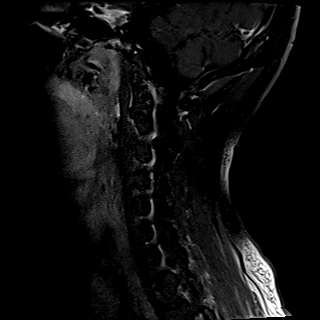
[im 9/17]
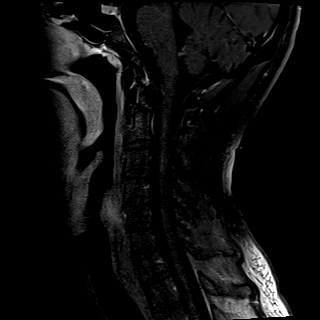
[im 13/17]
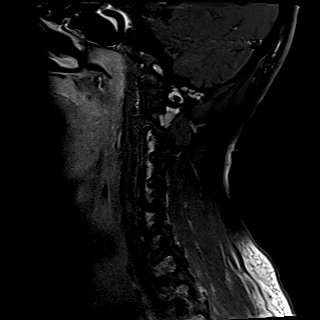
[im 17/17]
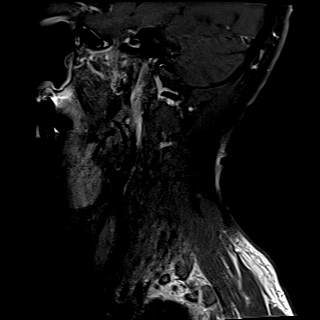

[Series 67: T1 post-contrast · axial · 3.0mm · 0.35mm/px · 1 of 32 slices shown]
[im 1/32]
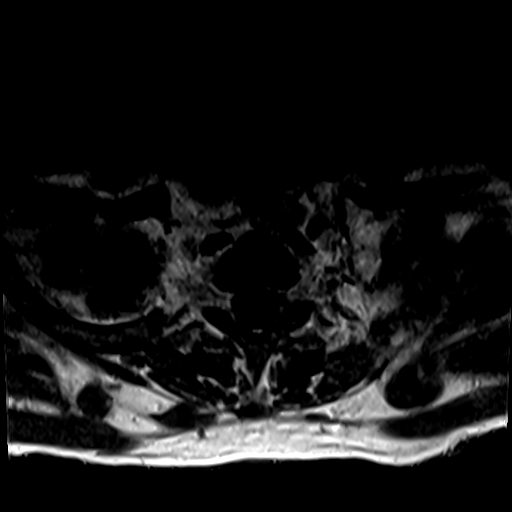

[36 of 48 positions shown; findings below may reference images not displayed]

FINDINGS: MRI CERVICAL SPINE FINDINGS

Alignment: Normal

Vertebrae: No fracture, evidence of discitis, or bone lesion.

Cord: Normal

Posterior Fossa, vertebral arteries, paraspinal tissues: Negative.

Disc levels:

There is no disc herniation. No spinal canal or neural foraminal
stenosis. No abnormal contrast enhancement.

MRI THORACIC SPINE FINDINGS

Alignment:  Physiologic.

Vertebrae: No fracture, evidence of discitis, or bone lesion.

Cord:  Normal signal and morphology.

Paraspinal and other soft tissues: Negative.

Disc levels:

No disc herniation, spinal canal stenosis or neural foraminal
stenosis. No abnormal contrast enhancement.

MRI LUMBAR SPINE FINDINGS

Segmentation:  Standard

Alignment:  Normal

Vertebrae:  No fracture, evidence of discitis, or bone lesion.

Conus medullaris and cauda equina: Conus extends to the L1 level.
Conus and cauda equina appear normal.

Paraspinal and other soft tissues: Negative

Disc levels:

All lumbar levels are normal. There is no disc herniation, spinal
canal stenosis or neural foraminal stenosis. No abnormal contrast
enhancement.
IMPRESSION: Normal MRI of the cervical, thoracic and lumbar spine.

## 2020-07-28 IMAGING — DX DG CHEST 1V PORT
1 series · 1 of 1 positions shown · non-contrast
Comparison: [DATE]

CLINICAL DATA: Cough, weakness

EXAM:
PORTABLE CHEST 1 VIEW

[chest ap]
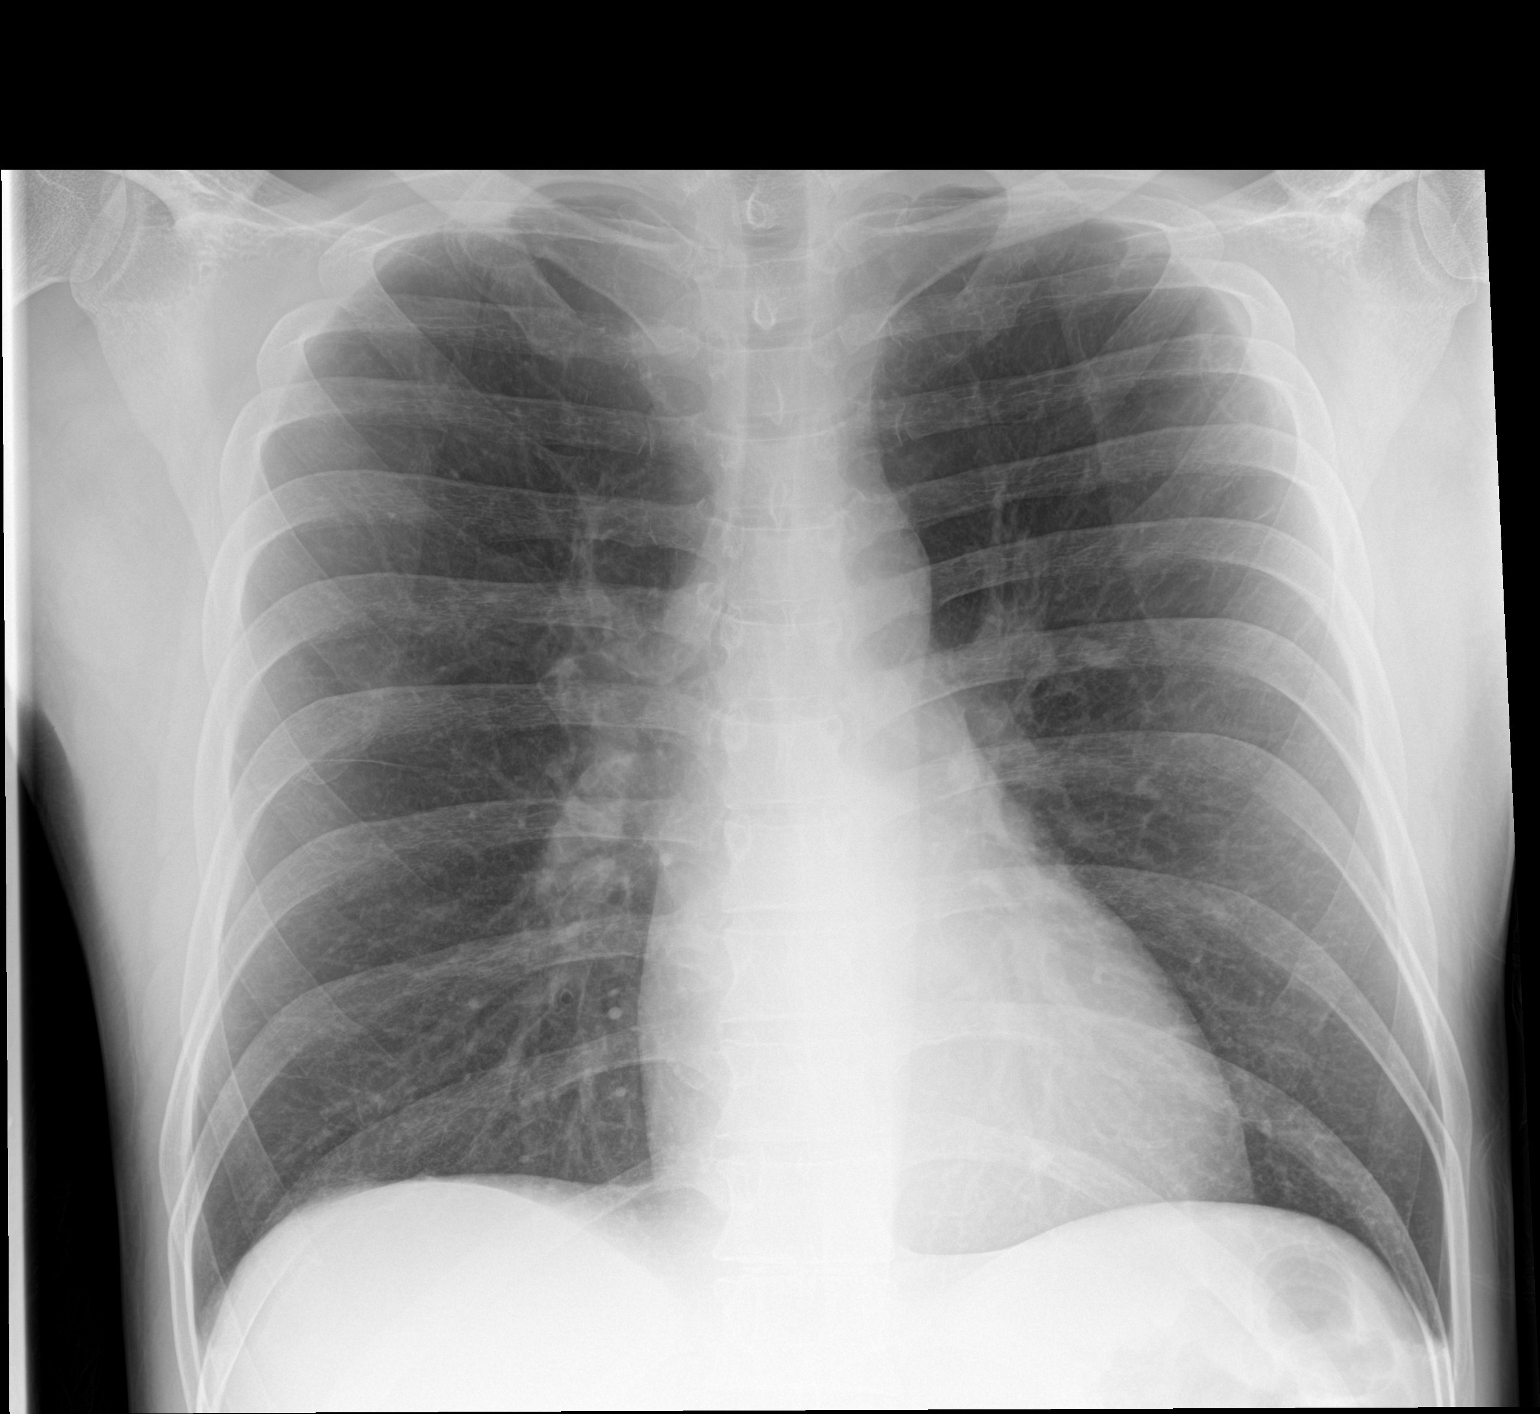

[1 of 1 positions shown; findings below may reference images not displayed]

FINDINGS: Lungs are clear.  No pleural effusion or pneumothorax.

The heart is normal in size.
IMPRESSION: No evidence of acute cardiopulmonary disease.

## 2020-07-28 IMAGING — MR MR LUMBAR SPINE WO/W CM
6 of 8 series · 35 of 48 positions shown · IV contrast (gadavist)
Comparison: None.

CLINICAL DATA: Lower extremity pain bilaterally.  HIV/AIDS

EXAM:
MRI CERVICAL, THORACIC AND LUMBAR SPINE WITHOUT AND WITH CONTRAST
TECHNIQUE: Multiplanar and multiecho pulse sequences of the cervical spine, to
include the craniocervical junction and cervicothoracic junction,
and thoracic and lumbar spine, were obtained without and with
intravenous contrast.
CONTRAST:  7mL GADAVIST GADOBUTROL 1 MMOL/ML IV SOLN

[Series 46: T1 · sagittal · 4.0mm · 0.81mm/px · 4 of 17 slices shown (1 of 2)]
[im 1/17]
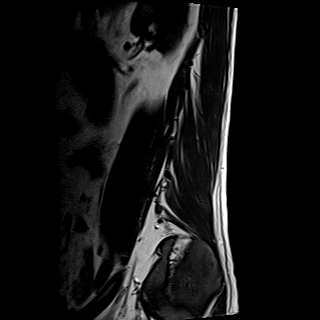
[im 6/17]
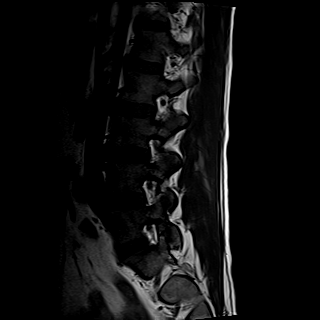
[im 11/17]
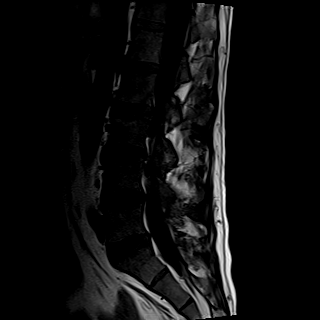
[im 17/17]
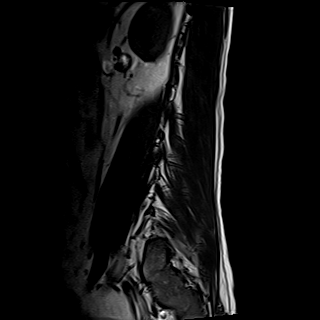

[Series 48: T2 · axial · 4.0mm · 0.62mm/px · z∈[-611,-383]mm · 9 of 44 slices shown (1 of 2)]
[im 1/44]
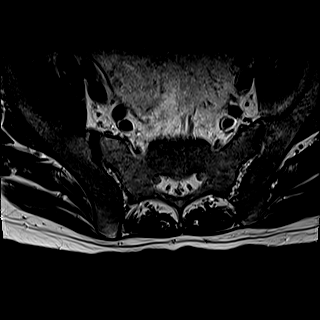
[im 6/44]
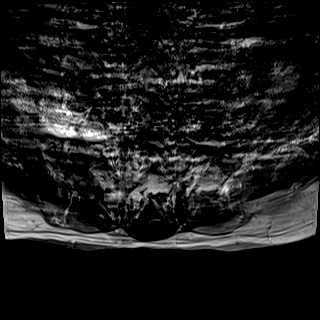
[im 11/44]
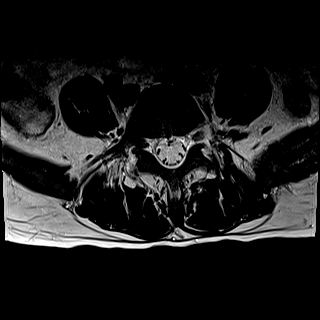
[im 17/44]
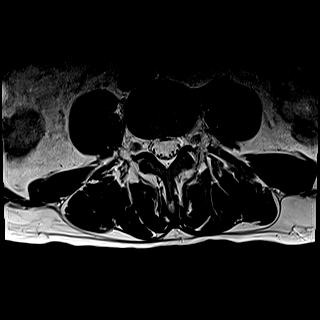
[im 22/44]
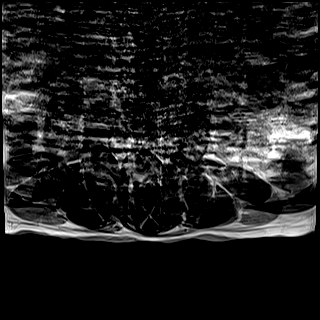
[im 27/44]
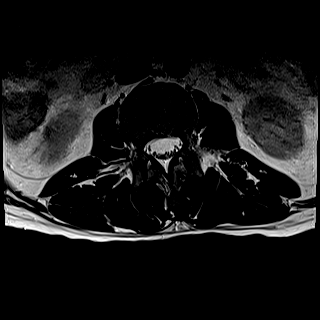
[im 33/44]
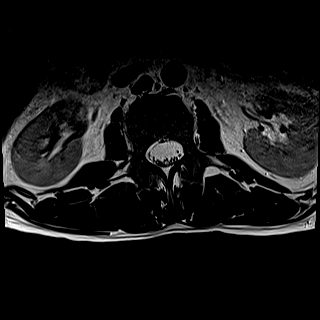
[im 38/44]
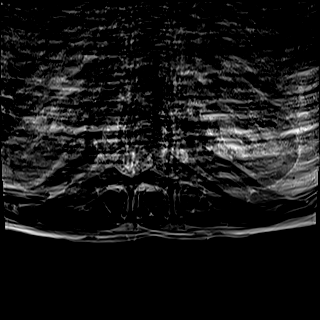
[im 44/44]
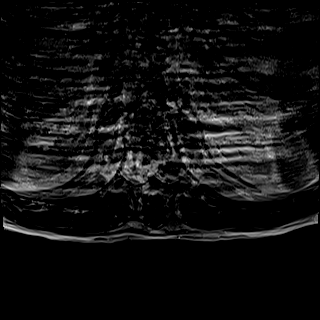

[Series 49: T1 · axial · 4.0mm · 0.39mm/px · z∈[-611,-383]mm · 8 of 44 slices shown (2 of 2)]
[im 1/44]
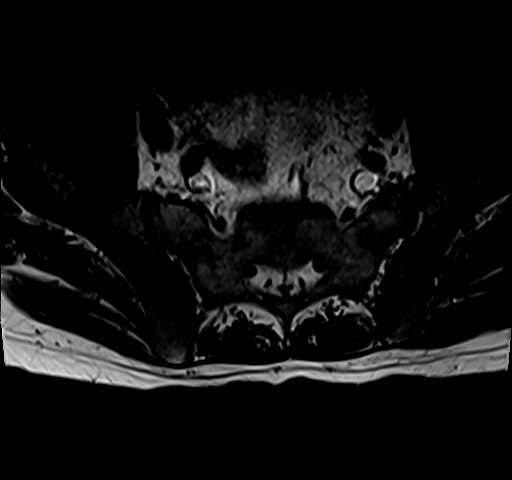
[im 6/44]
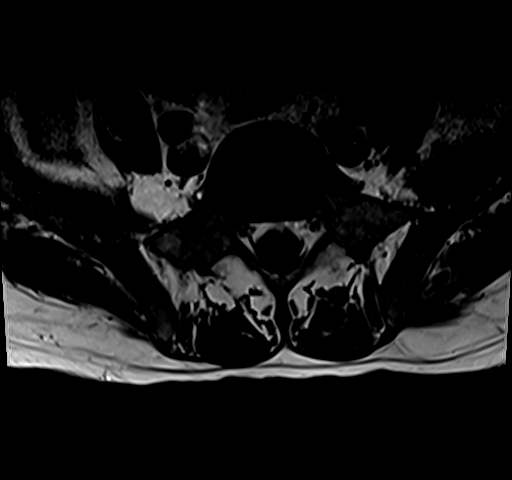
[im 11/44]
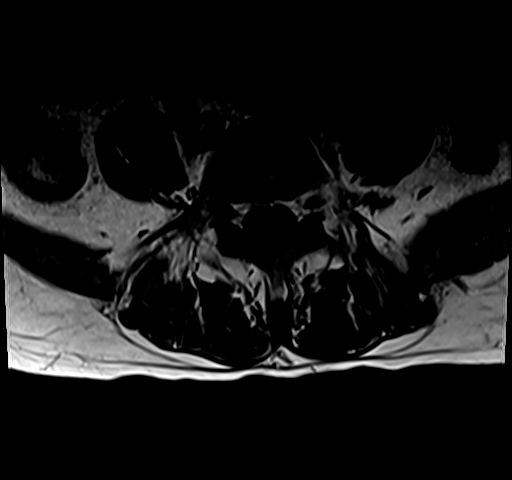
[im 17/44]
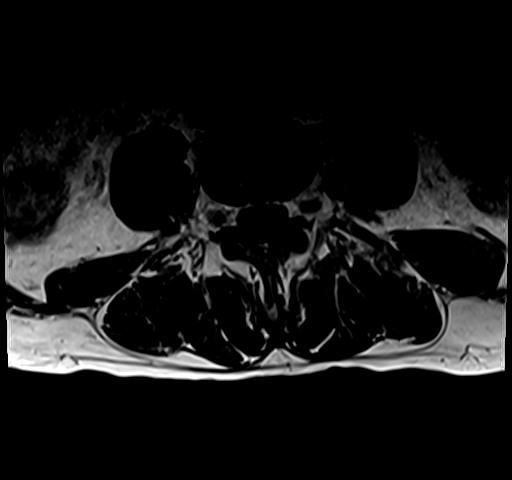
[im 27/44]
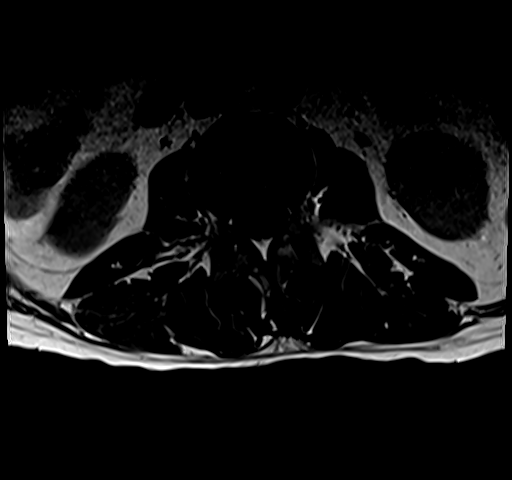
[im 33/44]
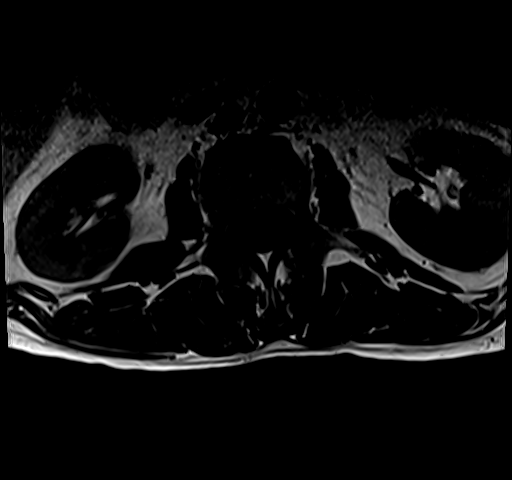
[im 38/44]
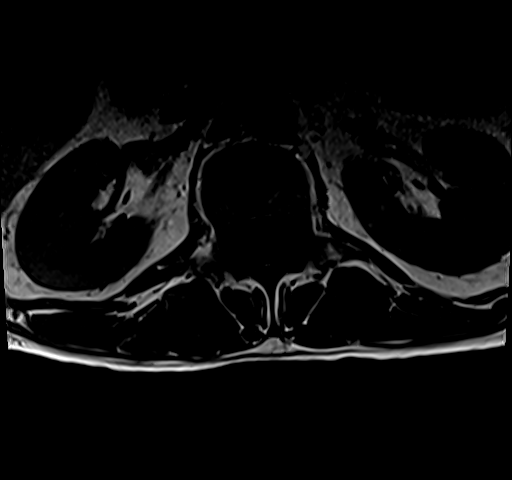
[im 44/44]
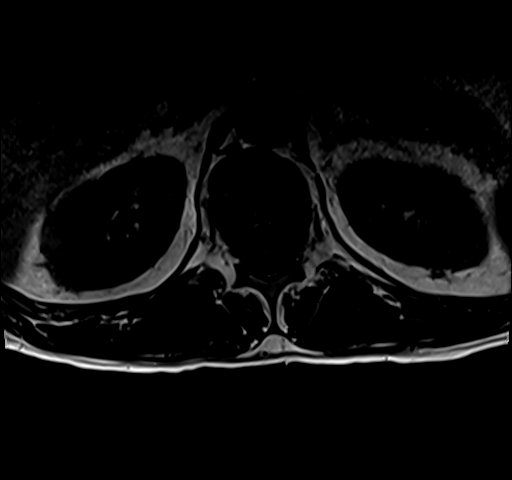

[Series 54: T2 · axial · 4.0mm · 0.62mm/px · z∈[-610,-383]mm · 9 of 44 slices shown (2 of 2)]
[im 1/44]
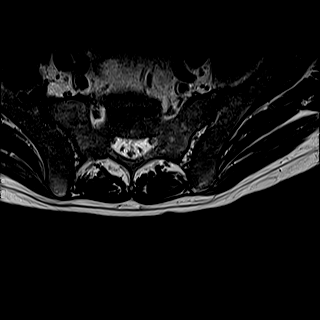
[im 6/44]
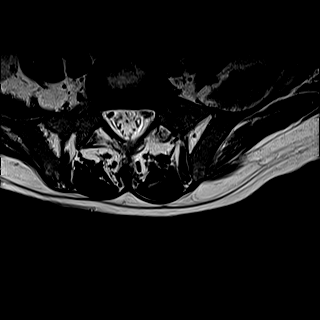
[im 11/44]
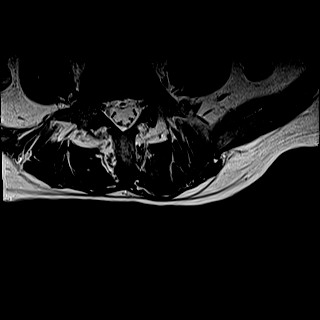
[im 17/44]
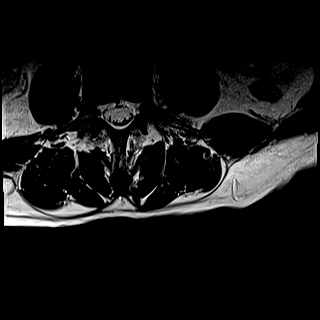
[im 22/44]
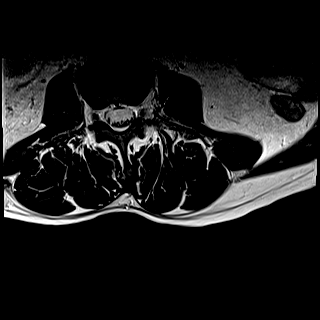
[im 27/44]
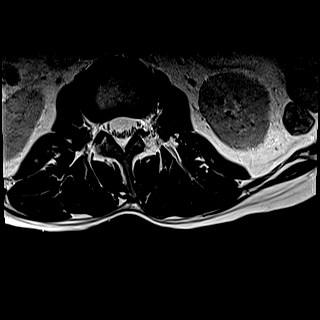
[im 33/44]
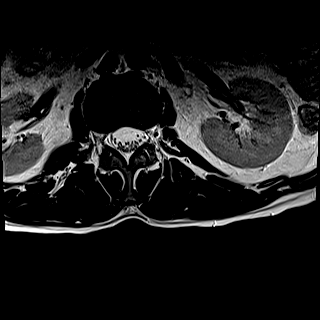
[im 38/44]
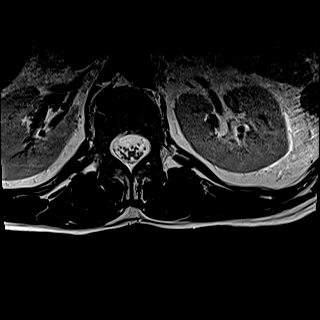
[im 44/44]
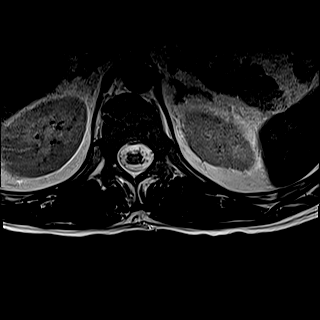

[Series 59: T2 post-contrast · sagittal · 4.0mm · 0.81mm/px · 3 of 17 slices shown]
[im 1/17]
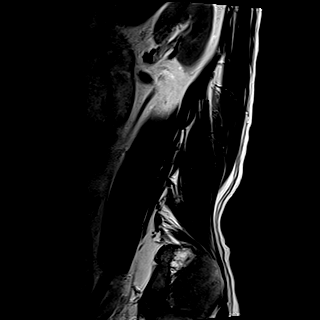
[im 9/17]
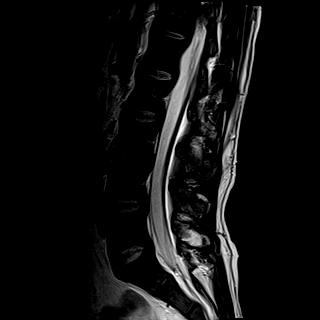
[im 17/17]
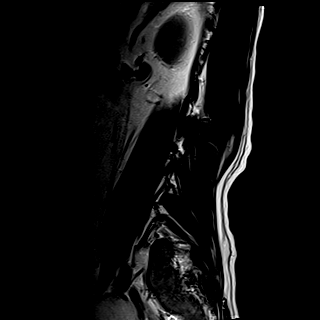

[Series 60: T1 fat-sat post-contrast · sagittal · 4.0mm · 0.81mm/px · 2 of 17 slices shown]
[im 1/17]
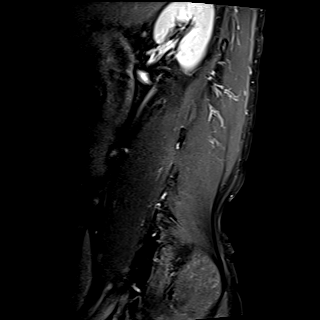
[im 9/17]
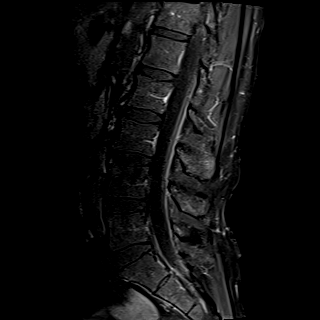

[35 of 48 positions shown; findings below may reference images not displayed]

FINDINGS: MRI CERVICAL SPINE FINDINGS

Alignment: Normal

Vertebrae: No fracture, evidence of discitis, or bone lesion.

Cord: Normal

Posterior Fossa, vertebral arteries, paraspinal tissues: Negative.

Disc levels:

There is no disc herniation. No spinal canal or neural foraminal
stenosis. No abnormal contrast enhancement.

MRI THORACIC SPINE FINDINGS

Alignment:  Physiologic.

Vertebrae: No fracture, evidence of discitis, or bone lesion.

Cord:  Normal signal and morphology.

Paraspinal and other soft tissues: Negative.

Disc levels:

No disc herniation, spinal canal stenosis or neural foraminal
stenosis. No abnormal contrast enhancement.

MRI LUMBAR SPINE FINDINGS

Segmentation:  Standard

Alignment:  Normal

Vertebrae:  No fracture, evidence of discitis, or bone lesion.

Conus medullaris and cauda equina: Conus extends to the L1 level.
Conus and cauda equina appear normal.

Paraspinal and other soft tissues: Negative

Disc levels:

All lumbar levels are normal. There is no disc herniation, spinal
canal stenosis or neural foraminal stenosis. No abnormal contrast
enhancement.
IMPRESSION: Normal MRI of the cervical, thoracic and lumbar spine.

## 2020-07-28 IMAGING — CT CT HEAD W/O CM
3 series · 14 of 47 positions shown, 16 images · non-contrast
Comparison: None.

CLINICAL DATA: Lower extremity pain

EXAM:
CT HEAD WITHOUT CONTRAST
TECHNIQUE: Contiguous axial images were obtained from the base of the skull
through the vertex without intravenous contrast.

[Series 2: head wo · axial · 0.47mm/px · z∈[-50,+75]mm · 8 of 30 slices shown, 10 images]
[im 3/30  brain]
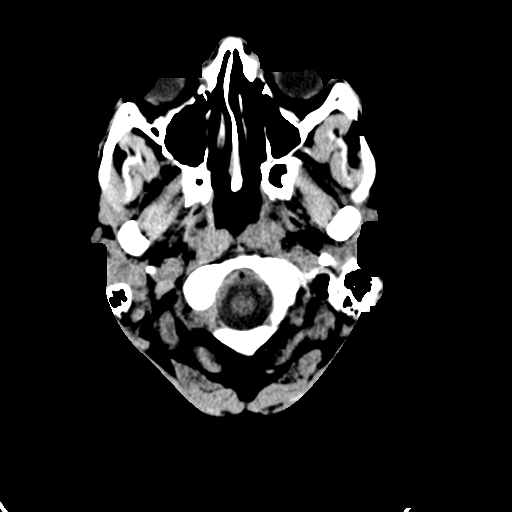
[im 3/30  bone]
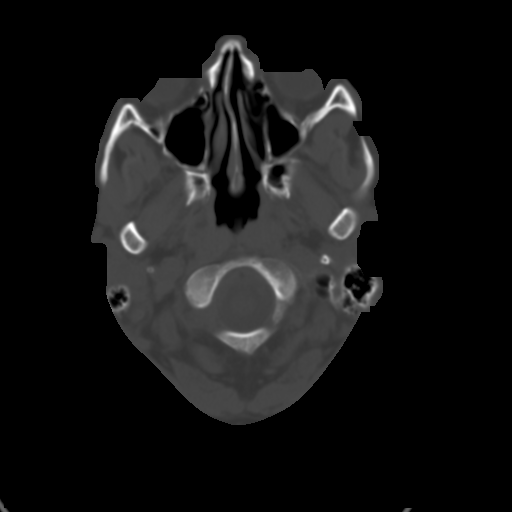
[im 7/30  brain]
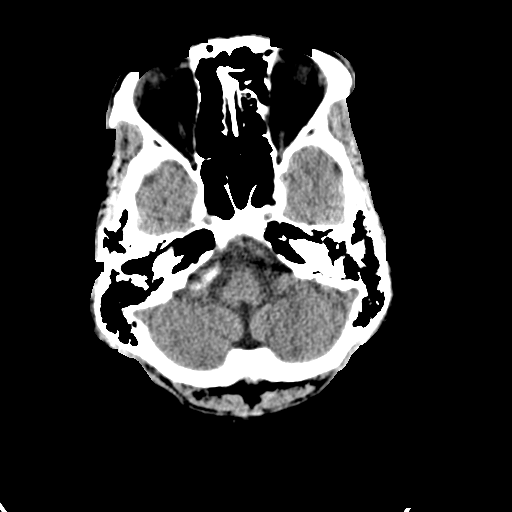
[im 10/30  brain]
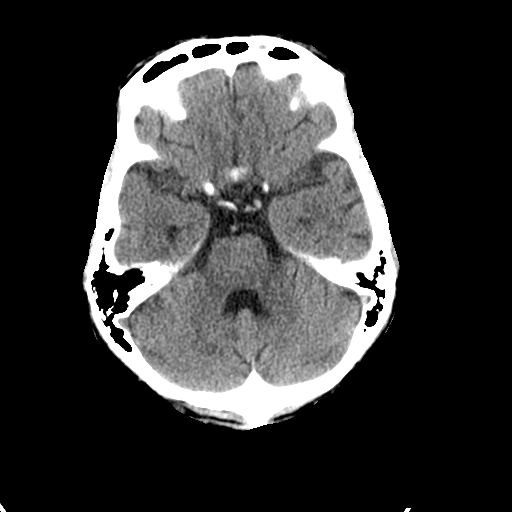
[im 14/30  brain]
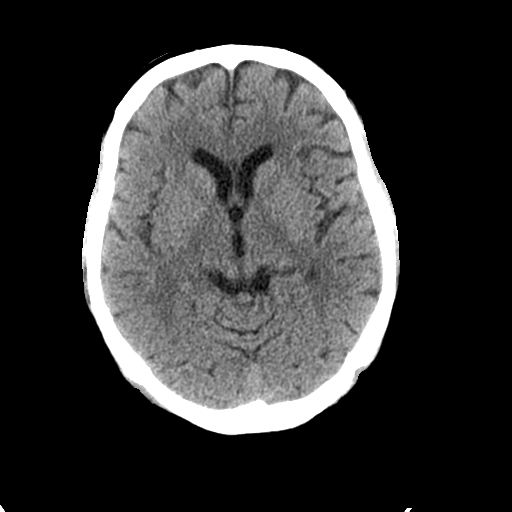
[im 17/30  brain]
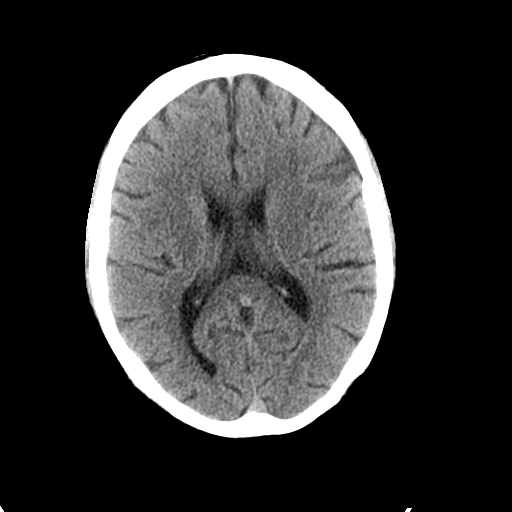
[im 17/30  bone]
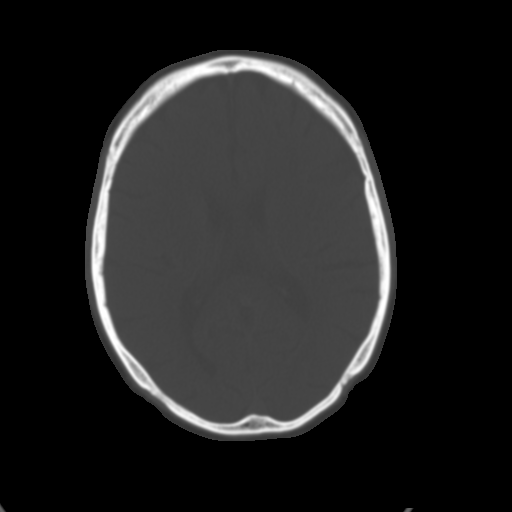
[im 21/30  brain]
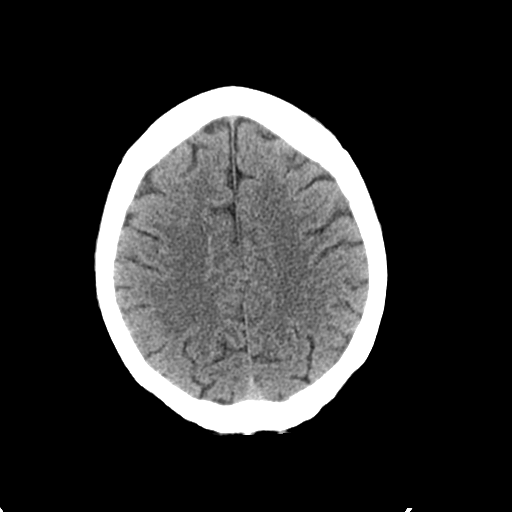
[im 24/30  brain]
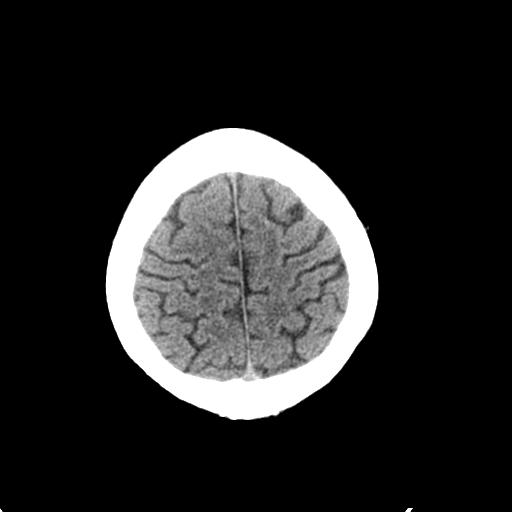
[im 28/30  brain]
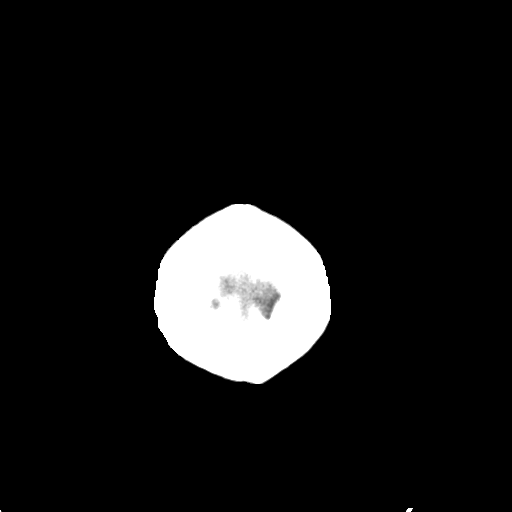

[Series 5: coronal soft tissue · coronal · 0.29mm/px · 3 of 76 slices shown]
[im 26/76  brain]
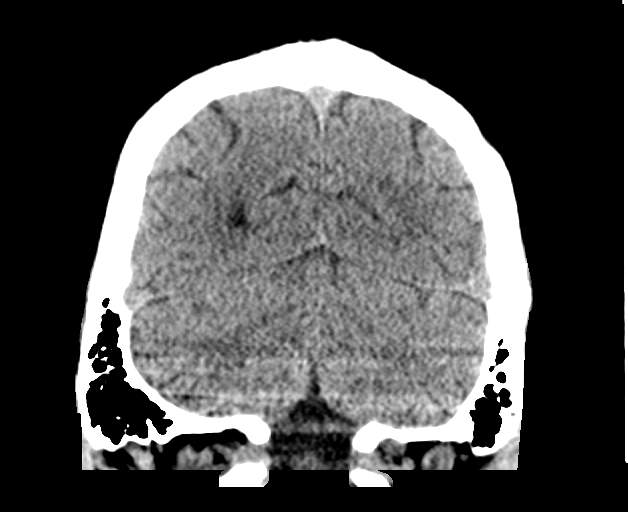
[im 34/76  brain]
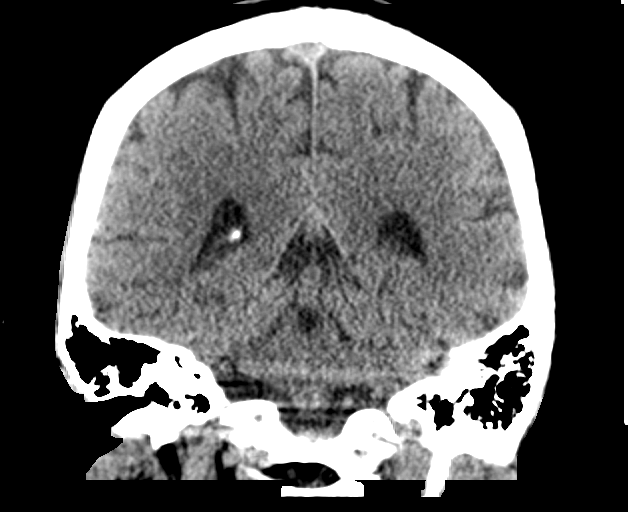
[im 42/76  brain]
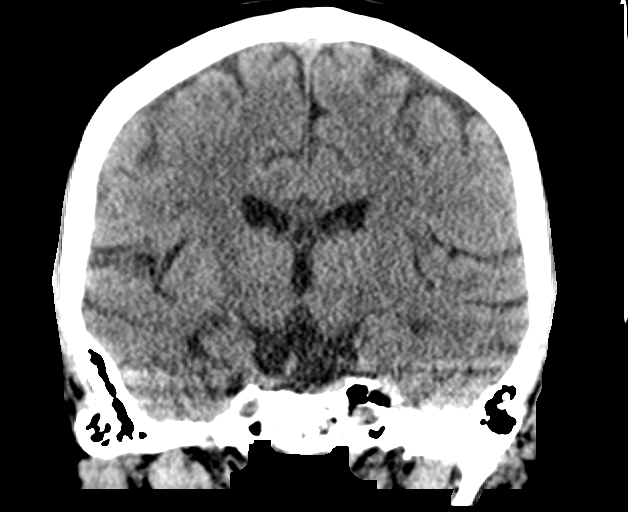

[Series 6: sagittal soft tissue · sagittal · 0.29mm/px · 3 of 62 slices shown]
[im 21/62  brain]
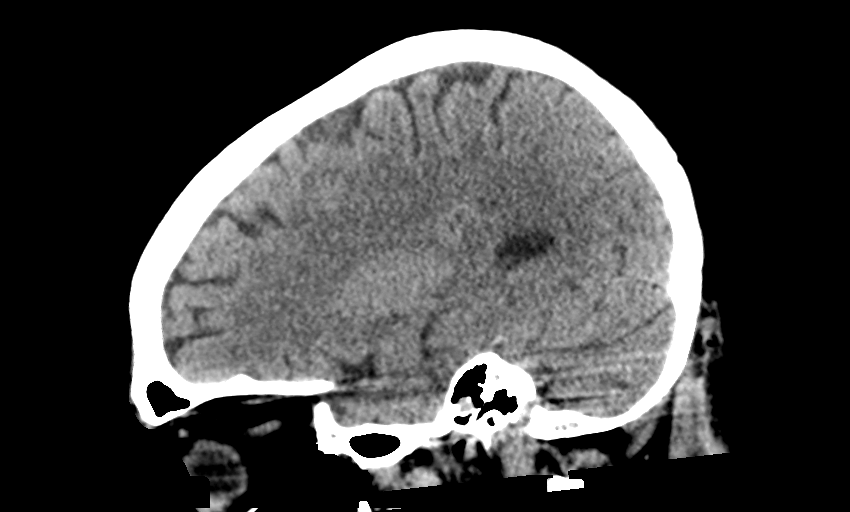
[im 31/62  brain]
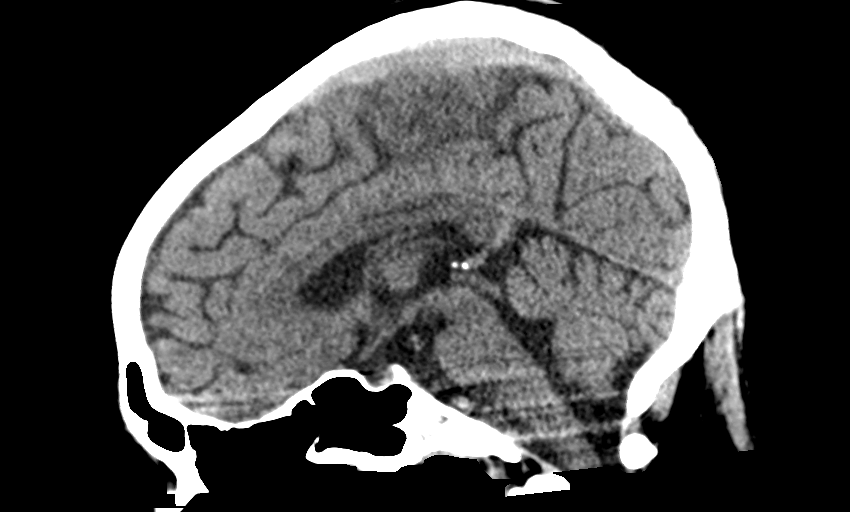
[im 41/62  brain]
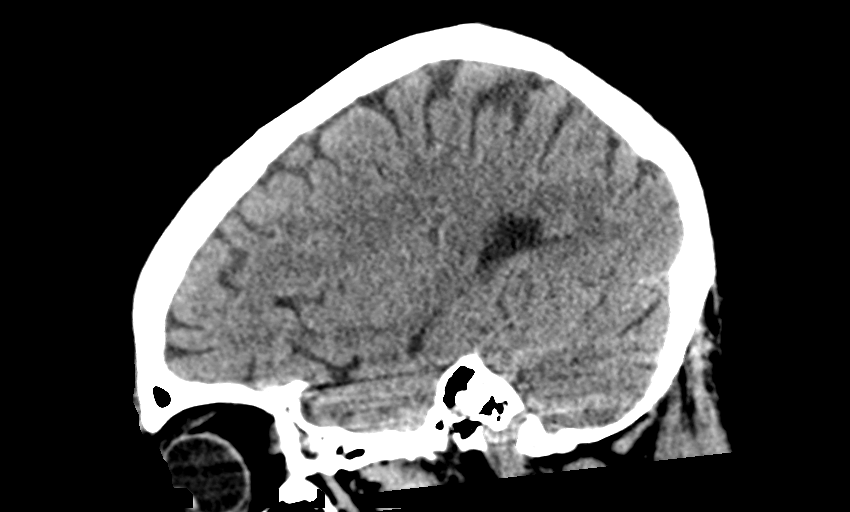

[14 of 47 positions shown; findings below may reference images not displayed]

FINDINGS: Brain: No evidence of acute infarction, hemorrhage, hydrocephalus,
extra-axial collection or mass lesion/mass effect.

Vascular: No hyperdense vessel or unexpected calcification.

Skull: Normal. Negative for fracture or focal lesion.

Sinuses/Orbits: The visualized paranasal sinuses are essentially
clear. The mastoid air cells are unopacified.

Other: None.
IMPRESSION: Normal head CT.

## 2020-07-28 MED ORDER — SODIUM CHLORIDE 0.9 % IV SOLN
2.0000 g | INTRAVENOUS | Status: DC
  Filled 2020-07-28 (×4): qty 2000

## 2020-07-28 MED ORDER — BICTEGRAVIR-EMTRICITAB-TENOFOV 50-200-25 MG PO TABS
1.0000 | ORAL_TABLET | Freq: Every day | ORAL | Status: DC
  Administered 2020-07-29 – 2020-08-18 (×21): 1 via ORAL
  Filled 2020-07-28 (×22): qty 1

## 2020-07-28 MED ORDER — METRONIDAZOLE IN NACL 5-0.79 MG/ML-% IV SOLN: 500.0000 mg | Freq: Once | INTRAVENOUS | Status: DC

## 2020-07-28 MED ORDER — VANCOMYCIN HCL 1250 MG/250ML IV SOLN
1250.0000 mg | Freq: Two times a day (BID) | INTRAVENOUS | Status: DC
  Administered 2020-07-29: 1250 mg via INTRAVENOUS
  Filled 2020-07-28: qty 250

## 2020-07-28 MED ORDER — SODIUM CHLORIDE 0.9 % IV SOLN: 200.0000 mg | INTRAVENOUS | Status: DC

## 2020-07-28 MED ORDER — LACTATED RINGERS IV BOLUS
500.0000 mL | Freq: Once | INTRAVENOUS | Status: AC
  Administered 2020-07-28: 500 mL via INTRAVENOUS

## 2020-07-28 MED ORDER — SODIUM CHLORIDE 0.9 % IV SOLN: 2.0000 g | Freq: Three times a day (TID) | INTRAVENOUS | Status: DC

## 2020-07-28 MED ORDER — LACTATED RINGERS IV SOLN: INTRAVENOUS | Status: DC

## 2020-07-28 MED ORDER — GADOBUTROL 1 MMOL/ML IV SOLN
7.0000 mL | Freq: Once | INTRAVENOUS | Status: AC | PRN
  Administered 2020-07-29: 7 mL via INTRAVENOUS

## 2020-07-28 MED ORDER — SODIUM CHLORIDE 0.9 % IV SOLN: 2.0000 g | Freq: Once | INTRAVENOUS | Status: DC

## 2020-07-28 MED ORDER — SODIUM CHLORIDE 0.9 % IV SOLN: 100.0000 mg | INTRAVENOUS | Status: DC

## 2020-07-28 MED ORDER — SODIUM CHLORIDE 0.9 % IV SOLN
200.0000 mg | Freq: Once | INTRAVENOUS | Status: DC
  Filled 2020-07-28: qty 200

## 2020-07-28 MED ORDER — SODIUM CHLORIDE 0.9 % IV SOLN
2.0000 g | Freq: Two times a day (BID) | INTRAVENOUS | Status: DC
  Administered 2020-07-28 – 2020-07-29 (×2): 2 g via INTRAVENOUS
  Filled 2020-07-28 (×2): qty 20

## 2020-07-28 MED ORDER — LACTATED RINGERS IV BOLUS
1000.0000 mL | Freq: Once | INTRAVENOUS | Status: AC
  Administered 2020-07-28: 1000 mL via INTRAVENOUS

## 2020-07-28 MED ORDER — DEXTROSE 5 % IV SOLN
10.0000 mg/kg | Freq: Three times a day (TID) | INTRAVENOUS | Status: DC
  Filled 2020-07-28 (×2): qty 13.2

## 2020-07-28 MED ORDER — ONDANSETRON HCL 4 MG PO TABS: 4.0000 mg | ORAL_TABLET | Freq: Four times a day (QID) | ORAL | Status: DC | PRN

## 2020-07-28 MED ORDER — ONDANSETRON HCL 4 MG/2ML IJ SOLN: 4.0000 mg | Freq: Four times a day (QID) | INTRAMUSCULAR | Status: DC | PRN

## 2020-07-28 MED ORDER — ACETAMINOPHEN 325 MG PO TABS
650.0000 mg | ORAL_TABLET | Freq: Four times a day (QID) | ORAL | Status: DC | PRN
  Administered 2020-07-30 – 2020-08-06 (×4): 650 mg via ORAL
  Filled 2020-07-28 (×4): qty 2

## 2020-07-28 MED ORDER — VANCOMYCIN HCL 1500 MG/300ML IV SOLN
1500.0000 mg | Freq: Once | INTRAVENOUS | Status: AC
  Administered 2020-07-29: 1500 mg via INTRAVENOUS
  Filled 2020-07-28: qty 300

## 2020-07-28 MED ORDER — ACETAMINOPHEN 650 MG RE SUPP: 650.0000 mg | Freq: Four times a day (QID) | RECTAL | Status: DC | PRN
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
## 2020-07-28 NOTE — Progress Notes (Signed)
NIF -40+ FVC 2.8L

## 2020-07-28 NOTE — ED Notes (Signed)
Patient transported to MRI. Will start fluids and vanc when he returns.

## 2020-07-28 NOTE — ED Provider Notes (Signed)
Lumbar puncture procedure note: Consent obtained for lumbar puncture.  Complications discussed.  Anesthesia with 1% Xylocaine x4 cc.  Lumbar 3,4 interspace entered without complication.  Spinal fluid clear.  4 tubes withdrawn.  Patient tolerated procedure well.   Donnetta Hutching, MD 07/28/20 2049

## 2020-07-28 NOTE — ED Notes (Signed)
Date and time results received: 07/28/20 3:26 PM  (use smartphrase ".now" to insert current time)  Test: Lactic  Critical Value: 2.7  Name of Provider Notified: Lanora Manis, [place] and Junior, RN  Orders Received? Or Actions Taken?: Actions Taken: Notified PA and primary RN

## 2020-07-28 NOTE — Progress Notes (Signed)
Best of 3 attempts: FVC 3.1L NIF -22

## 2020-07-28 NOTE — ED Provider Notes (Addendum)
Medical screening examination/treatment/procedure(s) were conducted as a shared visit with non-physician practitioner(s) and myself.  I personally evaluated the patient during the encounter.  EKG Interpretation  Date/Time:  Saturday July 28 2020 13:52:25 EDT Ventricular Rate:  111 PR Interval:    QRS Duration: 91 QT Interval:  363 QTC Calculation: 494 R Axis:   73 Text Interpretation: Sinus tachycardia Ventricular premature complex Aberrant complex Borderline prolonged QT interval 12 Lead; Mason-Likar Confirmed by Donnetta Hutching ([id number]) on 07/28/2020 3:45:00 PM Complex patient with HIV poorly controlled presents with bilateral leg weakness.  Blood pressure stable.  Slightly tachycardic.  Lactate elevated.  Teleneuro consult obtained.  Lumbar puncture performed by examiner.  See procedure note.   CRITICAL CARE Performed by: Donnetta Hutching Total critical care time: 75 minutes Critical care time was exclusive of separately billable procedures and treating other patients. Critical care was necessary to treat or prevent imminent or life-threatening deterioration. Critical care was time spent personally by me on the following activities: development of treatment plan with patient and/or surrogate as well as nursing, discussions with consultants, evaluation of patient's response to treatment, examination of patient, obtaining history from patient or surrogate, ordering and performing treatments and interventions, ordering and review of laboratory studies, ordering and review of radiographic studies, pulse oximetry and re-evaluation of patient's condition.     .Lumbar Puncture  Date/Time: 07/29/2020 3:00 PM Performed by: Donnetta Hutching, MD Authorized by: Donnetta Hutching, MD   Consent:    Consent obtained:  Verbal and written   Consent given by:  Patient   Risks discussed:  Pain, headache, infection and repeat procedure   Alternatives discussed:  No treatment Pre-procedure details:    Procedure  purpose:  Diagnostic Anesthesia (see MAR for exact dosages):    Anesthesia method:  Local infiltration   Local anesthetic:  Lidocaine 2% WITH epi Procedure details:    Lumbar space:  L3-L4 interspace   Patient position:  R lateral decubitus   Needle type:  Spinal needle - Quincke tip   Ultrasound guidance: no     Number of attempts:  1   Fluid appearance:  Clear   Tubes of fluid:  4   Total volume (ml): 12. Post-procedure:    Puncture site:  Adhesive bandage applied   Patient tolerance of procedure:  Tolerated well, no immediate complications   Donnetta Hutching, MD 07/28/20 2056    Donnetta Hutching, MD 07/28/20 0998    Donnetta Hutching, MD 07/29/20 1459    Donnetta Hutching, MD 07/29/20 [phone]
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
## 2020-07-28 NOTE — ED Notes (Signed)
Patient refused rectal temp

## 2020-07-28 NOTE — ED Triage Notes (Signed)
Pt presents via EMS with c/o lower extremity pain from his hips down on both sides. Pt reports the pain is in both legs. Pt is alert and oriented, no distress.
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
## 2020-07-28 NOTE — ED Notes (Signed)
MRI tech made aware of orders

## 2020-07-28 NOTE — ED Notes (Signed)
CRITICAL VALUE ALERT  Critical Value:  Lactic Acid 4.5  Date & Time Notied:  1725 07/28/2020  Provider Notified: Lyndel Safe  Orders Received/Actions taken: FLuids

## 2020-07-29 ENCOUNTER — Other Ambulatory Visit: Payer: Self-pay

## 2020-07-29 DIAGNOSIS — D638 Anemia in other chronic diseases classified elsewhere: Secondary | ICD-10-CM | POA: Diagnosis present

## 2020-07-29 DIAGNOSIS — M79604 Pain in right leg: Secondary | ICD-10-CM | POA: Diagnosis present

## 2020-07-29 DIAGNOSIS — R29898 Other symptoms and signs involving the musculoskeletal system: Secondary | ICD-10-CM | POA: Diagnosis not present

## 2020-07-29 DIAGNOSIS — E861 Hypovolemia: Secondary | ICD-10-CM | POA: Diagnosis not present

## 2020-07-29 DIAGNOSIS — K219 Gastro-esophageal reflux disease without esophagitis: Secondary | ICD-10-CM | POA: Diagnosis not present

## 2020-07-29 DIAGNOSIS — M6281 Muscle weakness (generalized): Secondary | ICD-10-CM | POA: Diagnosis present

## 2020-07-29 DIAGNOSIS — A530 Latent syphilis, unspecified as early or late: Secondary | ICD-10-CM | POA: Diagnosis present

## 2020-07-29 DIAGNOSIS — D539 Nutritional anemia, unspecified: Secondary | ICD-10-CM | POA: Diagnosis not present

## 2020-07-29 DIAGNOSIS — Z23 Encounter for immunization: Secondary | ICD-10-CM | POA: Diagnosis not present

## 2020-07-29 DIAGNOSIS — E872 Acidosis: Secondary | ICD-10-CM | POA: Diagnosis present

## 2020-07-29 DIAGNOSIS — E871 Hypo-osmolality and hyponatremia: Secondary | ICD-10-CM | POA: Diagnosis not present

## 2020-07-29 DIAGNOSIS — G610 Guillain-Barre syndrome: Secondary | ICD-10-CM | POA: Diagnosis present

## 2020-07-29 DIAGNOSIS — L02412 Cutaneous abscess of left axilla: Secondary | ICD-10-CM | POA: Diagnosis not present

## 2020-07-29 DIAGNOSIS — F17210 Nicotine dependence, cigarettes, uncomplicated: Secondary | ICD-10-CM | POA: Diagnosis not present

## 2020-07-29 DIAGNOSIS — Z21 Asymptomatic human immunodeficiency virus [HIV] infection status: Secondary | ICD-10-CM | POA: Diagnosis not present

## 2020-07-29 DIAGNOSIS — L02419 Cutaneous abscess of limb, unspecified: Secondary | ICD-10-CM | POA: Diagnosis not present

## 2020-07-29 DIAGNOSIS — Z87828 Personal history of other (healed) physical injury and trauma: Secondary | ICD-10-CM | POA: Diagnosis not present

## 2020-07-29 DIAGNOSIS — E039 Hypothyroidism, unspecified: Secondary | ICD-10-CM | POA: Diagnosis not present

## 2020-07-29 DIAGNOSIS — Z79899 Other long term (current) drug therapy: Secondary | ICD-10-CM | POA: Diagnosis not present

## 2020-07-29 DIAGNOSIS — R296 Repeated falls: Secondary | ICD-10-CM | POA: Diagnosis not present

## 2020-07-29 DIAGNOSIS — R202 Paresthesia of skin: Secondary | ICD-10-CM | POA: Diagnosis not present

## 2020-07-29 DIAGNOSIS — Z8619 Personal history of other infectious and parasitic diseases: Secondary | ICD-10-CM | POA: Diagnosis not present

## 2020-07-29 DIAGNOSIS — Z20822 Contact with and (suspected) exposure to covid-19: Secondary | ICD-10-CM | POA: Diagnosis not present

## 2020-07-29 DIAGNOSIS — E876 Hypokalemia: Secondary | ICD-10-CM | POA: Diagnosis not present

## 2020-07-29 DIAGNOSIS — R7401 Elevation of levels of liver transaminase levels: Secondary | ICD-10-CM | POA: Diagnosis present

## 2020-07-29 DIAGNOSIS — R292 Abnormal reflex: Secondary | ICD-10-CM | POA: Diagnosis not present

## 2020-07-29 DIAGNOSIS — E43 Unspecified severe protein-calorie malnutrition: Secondary | ICD-10-CM | POA: Diagnosis not present

## 2020-07-29 DIAGNOSIS — R590 Localized enlarged lymph nodes: Secondary | ICD-10-CM | POA: Diagnosis present

## 2020-07-29 DIAGNOSIS — R946 Abnormal results of thyroid function studies: Secondary | ICD-10-CM | POA: Diagnosis not present

## 2020-07-29 DIAGNOSIS — Z6820 Body mass index (BMI) 20.0-20.9, adult: Secondary | ICD-10-CM | POA: Diagnosis not present

## 2020-07-29 DIAGNOSIS — E440 Moderate protein-calorie malnutrition: Secondary | ICD-10-CM | POA: Diagnosis not present

## 2020-07-29 DIAGNOSIS — B20 Human immunodeficiency virus [HIV] disease: Secondary | ICD-10-CM | POA: Diagnosis present

## 2020-07-29 DIAGNOSIS — W19XXXA Unspecified fall, initial encounter: Secondary | ICD-10-CM | POA: Diagnosis not present

## 2020-07-29 LAB — CRYPTOCOCCAL ANTIGEN: Crypto Ag: NEGATIVE

## 2020-07-29 LAB — COMPREHENSIVE METABOLIC PANEL
ALT: 38 U/L (ref 0–44)
AST: 78 U/L — ABNORMAL HIGH (ref 15–41)
Albumin: 1.9 g/dL — ABNORMAL LOW (ref 3.5–5.0)
Alkaline Phosphatase: 139 U/L — ABNORMAL HIGH (ref 38–126)
Anion gap: 9 (ref 5–15)
BUN: <5 mg/dL — ABNORMAL LOW (ref 6–20)
CO2: 22 mmol/L (ref 22–32)
Calcium: 7.8 mg/dL — ABNORMAL LOW (ref 8.9–10.3)
Chloride: 101 mmol/L (ref 98–111)
Creatinine, Ser: 0.63 mg/dL (ref 0.61–1.24)
GFR calc Af Amer: >60 mL/min (ref >60)
GFR calc non Af Amer: >60 mL/min (ref >60)
Glucose, Bld: 99 mg/dL (ref 70–99)
Potassium: 3.3 mmol/L — ABNORMAL LOW (ref 3.5–5.1)
Sodium: 132 mmol/L — ABNORMAL LOW (ref 135–145)
Total Bilirubin: 0.9 mg/dL (ref 0.3–1.2)
Total Protein: 6.7 g/dL (ref 6.5–8.1)

## 2020-07-29 LAB — CBC
HCT: 28.3 % — ABNORMAL LOW (ref 39.0–52.0)
Hemoglobin: 9.6 g/dL — ABNORMAL LOW (ref 13.0–17.0)
MCH: 34.9 pg — ABNORMAL HIGH (ref 26.0–34.0)
MCHC: 33.9 g/dL (ref 30.0–36.0)
MCV: 102.9 fL — ABNORMAL HIGH (ref 80.0–100.0)
Platelets: 155 10*3/uL (ref 150–400)
RBC: 2.75 MIL/uL — ABNORMAL LOW (ref 4.22–5.81)
RDW: 15 % (ref 11.5–15.5)
WBC: 4.7 10*3/uL (ref 4.0–10.5)
nRBC: 0 % (ref 0.0–0.2)

## 2020-07-29 LAB — CRYPTOCOCCAL ANTIGEN, CSF: Crypto Ag: NEGATIVE

## 2020-07-29 LAB — RPR
RPR Ser Ql: REACTIVE — AB
RPR Titer: 1:16 {titer}

## 2020-07-29 LAB — LACTIC ACID, PLASMA
Lactic Acid, Venous: 3.2 mmol/L (ref 0.5–1.9)
Lactic Acid, Venous: 2.5 mmol/L (ref 0.5–1.9)

## 2020-07-29 LAB — RESPIRATORY PANEL BY RT PCR (FLU A&B, COVID)
Influenza A by PCR: NEGATIVE
Influenza B by PCR: NEGATIVE
SARS Coronavirus 2 by RT PCR: NEGATIVE

## 2020-07-29 MED ORDER — POTASSIUM CHLORIDE CRYS ER 20 MEQ PO TBCR
40.0000 meq | EXTENDED_RELEASE_TABLET | Freq: Once | ORAL | Status: AC
  Administered 2020-07-29: 40 meq via ORAL
  Filled 2020-07-29: qty 2

## 2020-07-29 MED ORDER — ENOXAPARIN SODIUM 40 MG/0.4ML ~~LOC~~ SOLN
40.0000 mg | SUBCUTANEOUS | Status: DC
  Administered 2020-07-31: 40 mg via SUBCUTANEOUS
  Filled 2020-07-29 (×4): qty 0.4

## 2020-07-29 MED ORDER — SULFAMETHOXAZOLE-TRIMETHOPRIM 800-160 MG PO TABS
1.0000 | ORAL_TABLET | Freq: Every day | ORAL | Status: DC
  Administered 2020-07-29 – 2020-08-18 (×21): 1 via ORAL
  Filled 2020-07-29 (×21): qty 1

## 2020-07-29 NOTE — Progress Notes (Signed)
PROGRESS NOTE  Travis Mcgee WLS:[id number] DOB: Dec 14, 1980 DOA: 07/28/2020 PCP: Patient, No Pcp Per  HPI/Recap of past 24 hours: HPI: Travis Mcgee is a 39 y.o. male with medical history significant of HIV/AIDS had been on HAART till he ran out recently these past couple weeks, syphilis treated with PCN earlier this year.  Presents to ED with c/o pain and bilateral lower extremity weakness.  He reports that over the past 7 to 10 days he has had worsening bilateral lower extremity pain, paresthesias and weakness. Has now progressed to the point that he is falling. He fell yesterday and then again on the day of his presentation.  Denies any injuries or striking his head from the fall.  Hereports that a few hours ago he started noticing a change in his voice that it became more hoarse. He reports a cough at baseline that is unchanged.  No known tick bites.   ED Course: Lactate 2.7, 4.5 on repeat.  WBC nl, P 108 but no other SIRS.  CT head neg. Tele neuro consulted, see their note.  ID consulted.  LP performed, MRI brain, cervical, thoracic, and lumbar spine unrevealing.  07/29/20: Seen and examined at his bedside in the ED.  Reports persistent pain in his lower extremities bilaterally and significant weakness unable to lift his legs off the bed.  Patient admitted at [facility] and is awaiting bed availability.  Neurology has been consulted, Dr. Thomasena Edis will see in consultation, please contact neurology when the patient arrives at [facility].   Assessment/Plan: Principal Problem:   Bilateral leg weakness Active Problems:   AIDS (acquired immune deficiency syndrome) (HCC)   History of syphilis   Lactic acidosis  Bilateral lower extremity pain/weakness Unclear etiology Work-up thus far unrevealing LP performed, MRI brain, cervical, thoracic, and lumbar spine unrevealing. Concern for possible Guillain-Barr syndrome or possible demyelinating polyneuropathy Continue NIF and VC,  currently not hypoxic, ABG done on 07/28/2020: 7.521/32.6/84.3 on room air.  O2 saturation 99% on room air. Neurology consulted, Dr. Thomasena Edis will see in consultation, please contact neurology when the patient arrives at [facility]. IV antibiotics stopped as recommended by ID. Continue fall precautions.  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  Lactic acidosis, unclear etiology Presented with lactic acid of 4.5, downtrended Repeat lactic acid level  Hypovolemic hyponatremia Hypovolemic on exam Presented with serum sodium of 130 Serum sodium uptrending, 122 Continue lactated Ringer started in the ED.  Hypokalemia Serum potassium 3.3 Repleted with KCl 40 mEq x 1 dose. Repeat chemistry panel in the morning.  Severe protein calorie malnutrition BMI 20 Albumin 1.9 Start oral supplement Encourage oral intake as tolerated.  HIV Ran out of his HAART medication for the past 2 weeks Resume Biktarvy as recommended by infectious disease. Infectious disease following.       Code Status: Full code.  Family Communication: None at bedside.  Disposition Plan: Home possibly with home health services PT OT when hemodynamically stable.   Consultants:  Neurology  Infectious disease  Procedures:  Lumbar puncture on 07/28/2020.  Antimicrobials:  Received 1 day of IV vancomycin and Rocephin.  Discontinued on 08/15/2020.  DVT prophylaxis: Subcu Lovenox daily  Status is: Observation   Dispo:  Patient From: Home  Planned Disposition:  Home with Health Care Svc  Expected discharge date: 08/01/20  Medically stable for discharge: No ongoing management of bilateral  lower extremity weakness with ongoing work-up.    Objective: Vitals:   07/29/20 0900 07/29/20 0946 07/29/20 1010 07/29/20 1030  BP: 120/87 119/87 124/72 102/64  Pulse: 93 (!) 104 100 98  Resp: 16 16 16 16   Temp:  98.4 F (36.9 C)    TempSrc:  Oral    SpO2: 100% 93% 98% 99%  Weight:      Height:        Intake/Output Summary (Last 24 hours) at 07/29/2020 1155 Last data filed at 07/29/2020 0948 Gross per 24 hour  Intake 3014.56 ml  Output 600 ml  Net 2414.56 ml   Filed Weights   07/28/20 1352  Weight: 65.8 kg    Exam:  . General: 39 y.o. year-old male well developed well nourished in no acute distress.  Alert and oriented x3. . Cardiovascular: Regular rate and rhythm with no rubs or gallops.  No thyromegaly or JVD noted.   41 Respiratory: Clear to auscultation with no wheezes or rales. Good inspiratory effort. . Abdomen: Soft nontender nondistended with normal bowel sounds x4 quadrants. . Musculoskeletal: No lower extremity edema.  Lower extremities are very tender with 2 out of 5 strength bilaterally. . Skin: No ulcerative lesions noted or rashes, . Psychiatry: Mood is appropriate for condition and setting   Data Reviewed: CBC: Recent Labs  Lab 07/28/20 1303 07/29/20 0419  WBC 6.9 4.7  NEUTROABS 4.6  --   HGB 11.5* 9.6*  HCT 33.4* 28.3*  MCV 102.5* 102.9*  PLT 195 155   Basic Metabolic Panel: Recent Labs  Lab 07/28/20 1303 07/28/20 1314 07/29/20 0419  NA 130*  --  132*  K 3.8  --  3.3*  CL 95*  --  101  CO2 23  --  22  GLUCOSE 73  --  99  BUN 7  --  <5*  CREATININE 0.64  --  0.63  CALCIUM 7.9*  --  7.8*  MG  --  1.8  --    GFR: Estimated Creatinine Clearance: 114.2 mL/min (by C-G formula based on SCr of 0.63 mg/dL). Liver Function Tests: Recent Labs  Lab 07/28/20 1303 07/29/20 0419  AST 98* 78*  ALT 41 38  ALKPHOS 190* 139*  BILITOT 2.4* 0.9  PROT 8.2* 6.7  ALBUMIN 2.4* 1.9*   No results for input(s): LIPASE, AMYLASE in the last 168  hours. No results for input(s): AMMONIA in the last 168 hours. Coagulation Profile: No results for input(s): INR, PROTIME in the last 168 hours. Cardiac Enzymes: Recent Labs  Lab 07/28/20 1303  CKTOTAL 122   BNP (last 3 results) No results for input(s): PROBNP in the last 8760 hours. HbA1C: No results for input(s): HGBA1C in the last 72 hours. CBG: No results for input(s): GLUCAP in the last 168 hours. Lipid Profile: No results for input(s): CHOL, HDL, LDLCALC, TRIG, CHOLHDL, LDLDIRECT in the last 72 hours. Thyroid Function Tests: Recent Labs    07/28/20 2128  TSH 1.886   Anemia Panel: No results for input(s): VITAMINB12, FOLATE, FERRITIN, TIBC, IRON, RETICCTPCT in the last 72 hours. Urine analysis:    Component Value Date/Time   COLORURINE AMBER (A) 07/28/2020 1303   APPEARANCEUR CLEAR 07/28/2020 1303   LABSPEC 1.012 07/28/2020 1303   PHURINE 6.0 07/28/2020 1303   GLUCOSEU NEGATIVE 07/28/2020 1303   HGBUR NEGATIVE 07/28/2020 1303   BILIRUBINUR SMALL (A) 07/28/2020 1303  KETONESUR NEGATIVE 07/28/2020 1303   PROTEINUR NEGATIVE 07/28/2020 1303   NITRITE NEGATIVE 07/28/2020 1303   LEUKOCYTESUR TRACE (A) 07/28/2020 1303   Sepsis Labs: @LABRCNTIP (procalcitonin:4,lacticidven:4)  ) Recent Results (from the past 240 hour(s))  Respiratory Panel by RT PCR (Flu A&B, Covid) - Nasopharyngeal Swab     Status: None   Collection Time: 07/28/20  1:05 PM   Specimen: Nasopharyngeal Swab  Result Value Ref Range Status   SARS Coronavirus 2 by RT PCR NEGATIVE NEGATIVE Corrected   Influenza A by PCR NEGATIVE NEGATIVE Final   Influenza B by PCR NEGATIVE NEGATIVE Final    Comment: (NOTE) The Xpert Xpress SARS-CoV-2/FLU/RSV assay is intended as an aid in  the diagnosis of influenza from Nasopharyngeal swab specimens and  should not be used as a sole basis for treatment. Nasal washings and  aspirates are unacceptable for Xpert Xpress SARS-CoV-2/FLU/RSV  testing.  Fact Sheet for  Patients: 09/27/20  Fact Sheet for Healthcare Providers: https://www.moore.com/  This test is not yet approved or cleared by the https://www.young.biz/ FDA and  has been authorized for detection and/or diagnosis of SARS-CoV-2 by  FDA under an Emergency Use Authorization (EUA). This EUA will remain  in effect (meaning this test can be used) for the duration of the  Covid-19 declaration under Section 564(b)(1) of the Act, 21  U.S.C. section 360bbb-3(b)(1), unless the authorization is  terminated or revoked. Performed at [facility], [address], [place], [place]   Culture, blood (routine x 2)     Status: None (Preliminary result)   Collection Time: 07/28/20  3:24 PM   Specimen: BLOOD RIGHT HAND  Result Value Ref Range Status   Specimen Description   Final    BLOOD RIGHT HAND Performed at [facility], [address] [place], [place]    Special Requests   Final    BOTTLES DRAWN AEROBIC AND ANAEROBIC Blood Culture adequate volume Performed at [facility], [address] [place], [place]    Culture   Final    NO GROWTH < 24 HOURS Performed at [facility], [address], [place], [place]    Report Status PENDING  Incomplete  CSF culture     Status: None (Preliminary result)   Collection Time: 07/28/20  8:19 PM   Specimen: CSF; Cerebrospinal Fluid  Result Value Ref Range Status   Specimen Description   Final    CSF Performed at [facility], [address], [place], [place]    Special Requests   Final    Immunocompromised Performed at [facility], [address] [place], [place]    Gram Stain   Final    WBC PRESENT, PREDOMINANTLY MONONUCLEAR NO ORGANISMS SEEN CYTOSPIN SMEAR    Culture   Final    NO GROWTH < 12 HOURS Performed at [facility], [address], [place], [place]    Report Status PENDING  Incomplete      Studies: CT Head Wo Contrast  Result Date: 07/28/2020 CLINICAL DATA:  Lower extremity pain EXAM: CT HEAD WITHOUT CONTRAST TECHNIQUE: Contiguous axial images were obtained from the base of the skull through the vertex without intravenous contrast. COMPARISON:  None. FINDINGS: Brain: No evidence of acute infarction, hemorrhage, hydrocephalus, extra-axial collection or mass lesion/mass effect. Vascular: No hyperdense vessel or unexpected calcification. Skull: Normal. Negative for fracture or focal lesion. Sinuses/Orbits: The visualized  paranasal sinuses are essentially clear. The mastoid air cells are unopacified. Other: None. IMPRESSION: Normal head CT. Electronically Signed   By: Charline BillsSriyesh  Krishnan M.D.   On: 07/28/2020 18:46   MR BRAIN WO CONTRAST  Result Date: 07/28/2020 CLINICAL DATA:  Motor neuron disease.  Lower extremity pain. EXAM: MRI HEAD WITHOUT CONTRAST TECHNIQUE: Multiplanar, multiecho pulse sequences of the brain and surrounding structures were obtained without intravenous contrast. COMPARISON:  None. FINDINGS: BRAIN: No acute infarct, acute hemorrhage or extra-axial collection. Normal white matter signal. Normal volume of CSF spaces. No chronic microhemorrhage. Normal midline structures. VASCULAR: Major flow voids are preserved. SKULL AND UPPER CERVICAL SPINE: Normal calvarium and skull base. Visualized upper cervical spine and soft tissues are normal. SINUSES/ORBITS: No paranasal sinus fluid levels or advanced mucosal thickening. No mastoid or middle ear effusion. Normal orbits. IMPRESSION: Normal brain MRI. Electronically Signed   By: Deatra RobinsonKevin  Herman M.D.   On: 07/28/2020 23:03   MR CERVICAL SPINE W WO CONTRAST  Result Date: 07/29/2020 CLINICAL DATA:  Lower extremity pain bilaterally.  HIV/AIDS EXAM: MRI CERVICAL, THORACIC AND LUMBAR SPINE WITHOUT AND WITH CONTRAST TECHNIQUE: Multiplanar and multiecho pulse sequences  of the cervical spine, to include the craniocervical junction and cervicothoracic junction, and thoracic and lumbar spine, were obtained without and with intravenous contrast. CONTRAST:  7mL GADAVIST GADOBUTROL 1 MMOL/ML IV SOLN COMPARISON:  None. FINDINGS: MRI CERVICAL SPINE FINDINGS Alignment: Normal Vertebrae: No fracture, evidence of discitis, or bone lesion. Cord: Normal Posterior Fossa, vertebral arteries, paraspinal tissues: Negative. Disc levels: There is no disc herniation. No spinal canal or neural foraminal stenosis. No abnormal contrast enhancement. MRI THORACIC SPINE FINDINGS Alignment:  Physiologic. Vertebrae: No fracture, evidence of discitis, or bone lesion. Cord:  Normal signal and morphology. Paraspinal and other soft tissues: Negative. Disc levels: No disc herniation, spinal canal stenosis or neural foraminal stenosis. No abnormal contrast enhancement. MRI LUMBAR SPINE FINDINGS Segmentation:  Standard Alignment:  Normal Vertebrae:  No fracture, evidence of discitis, or bone lesion. Conus medullaris and cauda equina: Conus extends to the L1 level. Conus and cauda equina appear normal. Paraspinal and other soft tissues: Negative Disc levels: All lumbar levels are normal. There is no disc herniation, spinal canal stenosis or neural foraminal stenosis. No abnormal contrast enhancement. IMPRESSION: Normal MRI of the cervical, thoracic and lumbar spine. Electronically Signed   By: Deatra RobinsonKevin  Herman M.D.   On: 07/29/2020 00:41   MR THORACIC SPINE W WO CONTRAST  Result Date: 07/29/2020 CLINICAL DATA:  Lower extremity pain bilaterally.  HIV/AIDS EXAM: MRI CERVICAL, THORACIC AND LUMBAR SPINE WITHOUT AND WITH CONTRAST TECHNIQUE: Multiplanar and multiecho pulse sequences of the cervical spine, to include the craniocervical junction and cervicothoracic junction, and thoracic and lumbar spine, were obtained without and with intravenous contrast. CONTRAST:  7mL GADAVIST GADOBUTROL 1 MMOL/ML IV SOLN COMPARISON:   None. FINDINGS: MRI CERVICAL SPINE FINDINGS Alignment: Normal Vertebrae: No fracture, evidence of discitis, or bone lesion. Cord: Normal Posterior Fossa, vertebral arteries, paraspinal tissues: Negative. Disc levels: There is no disc herniation. No spinal canal or neural foraminal stenosis. No abnormal contrast enhancement. MRI THORACIC SPINE FINDINGS Alignment:  Physiologic. Vertebrae: No fracture, evidence of discitis, or bone lesion. Cord:  Normal signal and morphology. Paraspinal and other soft tissues: Negative. Disc levels: No disc herniation, spinal canal stenosis or neural foraminal stenosis. No abnormal contrast enhancement. MRI LUMBAR SPINE FINDINGS Segmentation:  Standard Alignment:  Normal Vertebrae:  No fracture, evidence of discitis, or bone lesion. Conus medullaris and cauda equina:  Conus extends to the L1 level. Conus and cauda equina appear normal. Paraspinal and other soft tissues: Negative Disc levels: All lumbar levels are normal. There is no disc herniation, spinal canal stenosis or neural foraminal stenosis. No abnormal contrast enhancement. IMPRESSION: Normal MRI of the cervical, thoracic and lumbar spine. Electronically Signed   By: Deatra Robinson M.D.   On: 07/29/2020 00:41   MR Lumbar Spine W Wo Contrast  Result Date: 07/29/2020 CLINICAL DATA:  Lower extremity pain bilaterally.  HIV/AIDS EXAM: MRI CERVICAL, THORACIC AND LUMBAR SPINE WITHOUT AND WITH CONTRAST TECHNIQUE: Multiplanar and multiecho pulse sequences of the cervical spine, to include the craniocervical junction and cervicothoracic junction, and thoracic and lumbar spine, were obtained without and with intravenous contrast. CONTRAST:  7mL GADAVIST GADOBUTROL 1 MMOL/ML IV SOLN COMPARISON:  None. FINDINGS: MRI CERVICAL SPINE FINDINGS Alignment: Normal Vertebrae: No fracture, evidence of discitis, or bone lesion. Cord: Normal Posterior Fossa, vertebral arteries, paraspinal tissues: Negative. Disc levels: There is no disc  herniation. No spinal canal or neural foraminal stenosis. No abnormal contrast enhancement. MRI THORACIC SPINE FINDINGS Alignment:  Physiologic. Vertebrae: No fracture, evidence of discitis, or bone lesion. Cord:  Normal signal and morphology. Paraspinal and other soft tissues: Negative. Disc levels: No disc herniation, spinal canal stenosis or neural foraminal stenosis. No abnormal contrast enhancement. MRI LUMBAR SPINE FINDINGS Segmentation:  Standard Alignment:  Normal Vertebrae:  No fracture, evidence of discitis, or bone lesion. Conus medullaris and cauda equina: Conus extends to the L1 level. Conus and cauda equina appear normal. Paraspinal and other soft tissues: Negative Disc levels: All lumbar levels are normal. There is no disc herniation, spinal canal stenosis or neural foraminal stenosis. No abnormal contrast enhancement. IMPRESSION: Normal MRI of the cervical, thoracic and lumbar spine. Electronically Signed   By: Deatra Robinson M.D.   On: 07/29/2020 00:41   DG Chest Port 1 View  Result Date: 07/28/2020 CLINICAL DATA:  Cough, weakness EXAM: PORTABLE CHEST 1 VIEW COMPARISON:  11/29/2017 FINDINGS: Lungs are clear.  No pleural effusion or pneumothorax. The heart is normal in size. IMPRESSION: No evidence of acute cardiopulmonary disease. Electronically Signed   By: Charline Bills M.D.   On: 07/28/2020 13:30    Scheduled Meds: . bictegravir-emtricitabine-tenofovir AF  1 tablet Oral Daily    Continuous Infusions: . lactated ringers 125 mL/hr at 07/29/20 1149     LOS: 1 day     Darlin Drop, MD [facility] Pager [phone]  If 7PM-7AM, please contact night-coverage [email or web address] Password [email or web address] 07/29/2020, 11:55 AM

## 2020-07-29 NOTE — Progress Notes (Signed)
Best of 3 attempts: NIF -40 (+) FVC 2.4 L   

## 2020-07-29 NOTE — Progress Notes (Signed)
ID PROGRESS NOTE  I will see the patient formally today. 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 Recommend to stop IV abtx since this does not appear to be consistent with meningoencephalitis/nor discitis since imaging does not suggest discitis. In terms of HIV and hx of syphilis. Will check VDRL on CSF. Start back on ART.  Duke Salvia Drue Second MD MPH [facility] [phone]

## 2020-07-29 NOTE — Consult Note (Signed)
[facility]  Total days of antibiotics 1               Reason for Consult: lower extremity weakness    Referring Physician: hall Principal Problem:   Bilateral leg weakness Active Problems:   AIDS (acquired immune deficiency syndrome) (HCC)   History of syphilis   Lactic acidosis    HPI: Travis Mcgee is a 39 y.o. male with history of advanced hiv disease, CD 4 count of 92(6%)/VL undetectable (Feb 2021) has been on biktarvy and oi proph. Hx of syphilis ( 1:256) in feb 2021 now down to 1:16. 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 Initially thought it was due to excessive work/time on his feet but continues to worsen. He comes to the ED with worsening lower extremity weakness and sensitivity to touch. He also notices that his voice is hoarse as if he had been screaming all day. Denies shortness of breath. No weakness in arms. ED neuro exam reveals areflexia of lower extremities. His LP 0 wbc, 31 TP, glu 46. No organism on gram stain. He also underwent mri of brain, C-T-L spine -- all normal. He was initially started on broad spectrum abtx. He denies any recent diarrheal or respiratory infections prior to onset of neuro symptoms. He has missed a few doses of biktarvy since being out of town.  Past Medical History:  Diagnosis Date  . Burn   . GERD (gastroesophageal reflux disease)   . HIV (human immunodeficiency virus infection) (HCC)     Allergies: No Known Allergies   MEDICATIONS: . bictegravir-emtricitabine-tenofovir AF  1 tablet Oral Daily  . [START ON 07/30/2020] enoxaparin (LOVENOX) injection  40 mg Subcutaneous Q24H    Social History   Tobacco Use  . Smoking status: Current Every Day Smoker    Packs/day: 0.25    Types: Cigarettes  . Smokeless tobacco: Never Used  Substance Use Topics  . Alcohol use: Yes    Comment: weekends  . Drug use: Yes    Types:  Marijuana    Family History  Adopted: Yes     Review of Systems  Constitutional: Negative for fever, chills, diaphoresis, activity change, appetite change, fatigue and unexpected weight change.  HENT: Negative for congestion, sore throat, rhinorrhea, sneezing, trouble swallowing and sinus pressure.  Eyes: Negative for photophobia and visual disturbance.  Respiratory: Negative for cough, chest tightness, shortness of breath, wheezing and stridor.  Cardiovascular: Negative for chest pain, palpitations and leg swelling.  Gastrointestinal: Negative for nausea, vomiting, abdominal pain, diarrhea, constipation, blood in stool, abdominal distention and anal bleeding.  Genitourinary: Negative for dysuria, hematuria, flank pain and difficulty urinating.  Musculoskeletal: + lower extremity weakness. Negative for myalgias, back pain, joint swelling, arthralgias and gait problem.  Skin: Negative for color change, pallor, rash and wound.  Neurological: Negative for dizziness, tremors, weakness and light-headedness.  Hematological: Negative for adenopathy. Does not bruise/bleed easily.  Psychiatric/Behavioral: Negative for behavioral problems, confusion, sleep disturbance, dysphoric mood, decreased concentration and agitation.     OBJECTIVE: Temp:  [98.4 F (36.9 C)-98.8 F (37.1 C)] 98.4 F (36.9 C) (10/03 0946) Pulse Rate:  [79-110] 85 (10/03 1500) Resp:  [13-36] 13 (10/03 1500) BP: (100-146)/(63-94) 115/83 (10/03 1600) SpO2:  [93 %-100 %] 97 % (10/03 1500) Physical Exam  Constitutional: He is oriented to person, place, and time. He appears well-developed and well-nourished. No  distress.  HENT:  Mouth/Throat: Oropharynx is clear and moist. No oropharyngeal exudate.  Cardiovascular: Normal rate, regular rhythm and normal heart sounds. Exam reveals no gallop and no friction rub.  No murmur heard.  Pulmonary/Chest: Effort normal and breath sounds normal. No respiratory distress. He has no  wheezes.  Abdominal: Soft. Bowel sounds are normal. He exhibits no distension. There is no tenderness.  Lymphadenopathy:  He has no cervical adenopathy.  Neurological: He is alert and oriented to person, place, and time. CN 2-12 intact. Voice not at baseline per patient. Lower extremity weakness 3/5 bilaterally, out of proportion sensitivity to touch to LE bilaterally. areflexic Skin: Skin is warm and dry. No rash noted. No erythema.  Psychiatric: He has a normal mood and affect. His behavior is normal.     LABS: Results for orders placed or performed during the hospital encounter of 07/28/20 (from the past 48 hour(s))  Comprehensive metabolic panel     Status: Abnormal   Collection Time: 07/28/20  1:03 PM  Result Value Ref Range   Sodium 130 (L) 135 - 145 mmol/L   Potassium 3.8 3.5 - 5.1 mmol/L   Chloride 95 (L) 98 - 111 mmol/L   CO2 23 22 - 32 mmol/L   Glucose, Bld 73 70 - 99 mg/dL    Comment: Glucose reference range applies only to samples taken after fasting for at least 8 hours.   BUN 7 6 - 20 mg/dL   Creatinine, Ser 1.61 0.61 - 1.24 mg/dL   Calcium 7.9 (L) 8.9 - 10.3 mg/dL   Total Protein 8.2 (H) 6.5 - 8.1 g/dL   Albumin 2.4 (L) 3.5 - 5.0 g/dL   AST 98 (H) 15 - 41 U/L   ALT 41 0 - 44 U/L   Alkaline Phosphatase 190 (H) 38 - 126 U/L   Total Bilirubin 2.4 (H) 0.3 - 1.2 mg/dL   GFR calc non Af Amer >60 >60 mL/min   GFR calc Af Amer >60 >60 mL/min   Anion gap 12 5 - 15    Comment: Performed at [facility], [address], [place], [place] [address]  CBC with Differential     Status: Abnormal   Collection Time: 07/28/20  1:03 PM  Result Value Ref Range   WBC 6.9 4.0 - 10.5 K/uL   RBC 3.26 (L) 4.22 - 5.81 MIL/uL   Hemoglobin 11.5 (L) 13.0 - 17.0 g/dL   HCT 54.0 (L) 39 - 52 %   MCV 102.5 (H) 80.0 - 100.0 fL   MCH 35.3 (H) 26.0 - 34.0 pg   MCHC 34.4 30.0 - 36.0 g/dL   RDW 98.1 19.1 - 47.8 %   Platelets 195 150 - 400 K/uL   nRBC 0.0 0.0 - 0.2 %    Neutrophils Relative % 66 %   Neutro Abs 4.6 1.7 - 7.7 K/uL   Lymphocytes Relative 26 %   Lymphs Abs 1.8 0.7 - 4.0 K/uL   Monocytes Relative 6 %   Monocytes Absolute 0.4 0 - 1 K/uL   Eosinophils Relative 1 %   Eosinophils Absolute 0.1 0 - 0 K/uL   Basophils Relative 0 %   Basophils Absolute 0.0 0 - 0 K/uL   Immature Granulocytes 1 %   Abs Immature Granulocytes 0.04 0.00 - 0.07 K/uL    Comment: Performed at [facility], [address] [place], [place] [address]  Lactic acid, plasma     Status: Abnormal   Collection Time: 07/28/20  1:03 PM  Result Value Ref Range   Lactic Acid, Venous 2.7 (HH) 0.5 - 1.9 mmol/L    Comment: CRITICAL RESULT CALLED TO, READ BACK BY AND VERIFIED WITH: S.CLAPP,RN [id number]  BY V.WILKINS Performed at [facility], [address] [place], [place] [address]   Urinalysis, Routine w reflex microscopic Urine, Clean Catch     Status: Abnormal   Collection Time: 07/28/20  1:03 PM  Result Value Ref Range   Color, Urine AMBER (A) YELLOW    Comment: BIOCHEMICALS MAY BE AFFECTED BY COLOR   APPearance CLEAR CLEAR   Specific Gravity, Urine 1.012 1.005 - 1.030   pH 6.0 5.0 - 8.0   Glucose, UA NEGATIVE NEGATIVE mg/dL   Hgb urine dipstick NEGATIVE NEGATIVE   Bilirubin Urine SMALL (A) NEGATIVE   Ketones, ur NEGATIVE NEGATIVE mg/dL   Protein, ur NEGATIVE NEGATIVE mg/dL   Nitrite NEGATIVE NEGATIVE   Leukocytes,Ua TRACE (A) NEGATIVE   RBC / HPF 0-5 0 - 5 RBC/hpf   WBC, UA 6-10 0 - 5 WBC/hpf   Bacteria, UA NONE SEEN NONE SEEN   Squamous Epithelial / LPF 0-5 0 - 5   Mucus PRESENT     Comment: Performed at [facility], [address], [place], [place] [address]  Urine rapid drug screen (hosp performed)     Status: None   Collection Time: 07/28/20  1:03 PM  Result Value Ref Range   Opiates NONE DETECTED NONE DETECTED   Cocaine NONE DETECTED NONE DETECTED   Benzodiazepines NONE DETECTED NONE DETECTED    Amphetamines NONE DETECTED NONE DETECTED   Tetrahydrocannabinol NONE DETECTED NONE DETECTED   Barbiturates NONE DETECTED NONE DETECTED    Comment: (NOTE) DRUG SCREEN FOR MEDICAL PURPOSES ONLY.  IF CONFIRMATION IS NEEDED FOR ANY PURPOSE, NOTIFY LAB WITHIN 5 DAYS.  LOWEST DETECTABLE LIMITS FOR URINE DRUG SCREEN Drug Class                     Cutoff (ng/mL) Amphetamine and metabolites    1000 Barbiturate and metabolites    200 Benzodiazepine                 200 Tricyclics and metabolites     300 Opiates and metabolites        300 Cocaine and metabolites        300 THC                            50 Performed at [facility], [address] [place], [place] [address]   CK     Status: None   Collection Time: 07/28/20  1:03 PM  Result Value Ref Range   Total CK 122 49.0 - 397.0 U/L    Comment: Performed at [facility], [address], [place], [place] [address]  RPR     Status: Abnormal   Collection Time: 07/28/20  1:03 PM  Result Value Ref Range   RPR Ser Ql Reactive (A) NON REACTIVE    Comment: SENT FOR CONFIRMATION   RPR Titer 1:16     Comment: Performed at [facility], [address], [place], [place] [address]  Respiratory Panel by RT PCR (Flu A&B, Covid) - Nasopharyngeal Swab     Status: None   Collection Time: 07/28/20  1:05 PM   Specimen: Nasopharyngeal Swab  Result Value Ref Range   SARS Coronavirus 2  by RT PCR NEGATIVE NEGATIVE   Influenza A by PCR NEGATIVE NEGATIVE   Influenza B by PCR NEGATIVE NEGATIVE    Comment: (NOTE) The Xpert Xpress SARS-CoV-2/FLU/RSV assay is intended as an aid in  the diagnosis of influenza from Nasopharyngeal swab specimens and  should not be used as a sole basis for treatment. Nasal washings and  aspirates are unacceptable for Xpert Xpress SARS-CoV-2/FLU/RSV  testing.  Fact Sheet for Patients: https://www.moore.com/  Fact Sheet for Healthcare  Providers: https://www.young.biz/  This test is not yet approved or cleared by the [place] FDA and  has been authorized for detection and/or diagnosis of SARS-CoV-2 by  FDA under an Emergency Use Authorization (EUA). This EUA will remain  in effect (meaning this test can be used) for the duration of the  Covid-19 declaration under Section 564(b)(1) of the Act, 21  U.S.C. section 360bbb-3(b)(1), unless the authorization is  terminated or revoked. Performed at [facility], [address], [place], [place] [address]   Magnesium     Status: None   Collection Time: 07/28/20  1:14 PM  Result Value Ref Range   Magnesium 1.8 1.7 - 2.4 mg/dL    Comment: Performed at [facility], [address], [place], [place] [address]  Lactic acid, plasma     Status: Abnormal   Collection Time: 07/28/20  3:03 PM  Result Value Ref Range   Lactic Acid, Venous 4.5 (HH) 0.5 - 1.9 mmol/L    Comment: CRITICAL VALUE NOTED.  VALUE IS CONSISTENT WITH PREVIOUSLY REPORTED AND CALLED VALUE. Performed at [facility], [address], [place], [place] [address]   Culture, blood (routine x 2)     Status: None (Preliminary result)   Collection Time: 07/28/20  3:24 PM   Specimen: BLOOD RIGHT HAND  Result Value Ref Range   Specimen Description      BLOOD RIGHT HAND Performed at [facility], [address], [place], [place] [address]    Special Requests      BOTTLES DRAWN AEROBIC AND ANAEROBIC Blood Culture adequate volume Performed at [facility], [address], [place], [place] [address]    Culture      NO GROWTH < 24 HOURS Performed at [facility], [address], [place], [place] [address]    Report Status PENDING   Blood gas, arterial     Status: Abnormal   Collection Time: 07/28/20  6:51 PM  Result Value Ref Range   FIO2 21.00    Delivery systems ROOM AIR    pH, Arterial  7.521 (H) 7.35 - 7.45   pCO2 arterial 32.6 32 - 48 mmHg   pO2, Arterial 84.3 83 - 108 mmHg   Bicarbonate 26.5 20.0 - 28.0 mmol/L   Acid-Base Excess 4.0 (H) 0.0 - 2.0 mmol/L   O2 Saturation 96.3 %   Patient temperature 98.6    Allens test (pass/fail) PASS PASS    Comment: Performed at [facility], [address], [place], [place] [address]  Protein and glucose, CSF     Status: None   Collection Time: 07/28/20  8:19 PM  Result Value Ref Range   Glucose, CSF 46 40 - 70 mg/dL   Total  Protein, CSF 31 15 - 45 mg/dL    Comment: Performed at [facility], [address] [place], [place] [address]  CSF culture     Status: None (Preliminary result)   Collection  Time: 07/28/20  8:19 PM   Specimen: CSF; Cerebrospinal Fluid  Result Value Ref Range   Specimen Description      CSF Performed at [facility], [address], [place], [place] [address]    Special Requests      Immunocompromised Performed at [facility], [address] [place], [place] [address]    Gram Stain      WBC PRESENT, PREDOMINANTLY MONONUCLEAR NO ORGANISMS SEEN CYTOSPIN SMEAR    Culture      NO GROWTH < 12 HOURS Performed at [facility], [address], [place], [place] [address]    Report Status PENDING   CSF cell count with differential collection tube #: 1     Status: None   Collection Time: 07/28/20  8:19 PM  Result Value Ref Range   Tube # 1    Color, CSF COLORLESS COLORLESS   Appearance, CSF CLEAR CLEAR   Supernatant NOT INDICATED    RBC Count, CSF 0 0 /cu mm   WBC, CSF 0 0 - 5 /cu mm   Segmented Neutrophils-CSF TOO FEW TO COUNT, SMEAR AVAILABLE FOR REVIEW 0 - 6 %   Lymphs, CSF TOO FEW TO COUNT, SMEAR AVAILABLE FOR REVIEW 40 - 80 %   Monocyte-Macrophage-Spinal Fluid TOO FEW TO COUNT, SMEAR AVAILABLE FOR REVIEW 15 - 45 %   Eosinophils, CSF TOO FEW TO COUNT, SMEAR AVAILABLE FOR REVIEW 0 - 1 %   Other Cells, CSF TOO FEW  TO COUNT, SMEAR AVAILABLE FOR REVIEW     Comment: Performed at [facility], [address], [place], [place] [address]  CSF cell count with differential collection tube #: 4     Status: None   Collection Time: 07/28/20  8:19 PM  Result Value Ref Range   Tube # 4    Color, CSF COLORLESS COLORLESS   Appearance, CSF CLEAR CLEAR   Supernatant NOT INDICATED    RBC Count, CSF 0 0 /cu mm   WBC, CSF 0 0 - 5 /cu mm   Segmented Neutrophils-CSF TOO FEW TO COUNT, SMEAR AVAILABLE FOR REVIEW 0 - 6 %   Lymphs, CSF TOO FEW TO COUNT, SMEAR AVAILABLE FOR REVIEW 40 - 80 %   Monocyte-Macrophage-Spinal Fluid TOO FEW TO COUNT, SMEAR AVAILABLE FOR REVIEW 15 - 45 %   Eosinophils, CSF TOO FEW TO COUNT, SMEAR AVAILABLE FOR REVIEW 0 - 1 %   Other Cells, CSF TOO FEW TO COUNT, SMEAR AVAILABLE FOR REVIEW     Comment: Performed at [facility], [address], [place], [place] [address]  Cryptococcal antigen, CSF     Status: None   Collection Time: 07/28/20  9:28 PM  Result Value Ref Range   Crypto Ag NEGATIVE NEGATIVE   Cryptococcal Ag Titer NOT INDICATED NOT INDICATED    Comment: Performed at [facility], [address] [place], [place] [address]  Sedimentation rate     Status: Abnormal   Collection Time: 07/28/20  9:28 PM  Result Value Ref Range   Sed Rate 98 (H) 0 - 16 mm/hr    Comment: Performed at [facility], [address] [place], [place] [address]  C-reactive protein     Status: Abnormal   Collection Time: 07/28/20  9:28 PM  Result Value Ref Range   CRP 3.6 (H) <1.0 mg/dL    Comment: Performed at [facility], [address] [place], [place]  [address]  TSH     Status: None   Collection Time: 07/28/20  9:28 PM  Result Value Ref Range   TSH 1.886 0.350 - 4.500 uIU/mL    Comment: Performed by a 3rd Generation assay with a functional sensitivity of <=0.01 uIU/mL. Performed at [facility], [address], [place], [place] [address]   Cryptococcal antigen     Status: None   Collection Time: 07/28/20  9:28 PM  Result Value Ref Range   Crypto Ag NEGATIVE NEGATIVE   Cryptococcal Ag Titer NOT INDICATED NOT INDICATED    Comment: Performed at [facility], [address], [place], [place] [address]  Lactic acid, plasma     Status: Abnormal   Collection Time: 07/28/20  9:28 PM  Result Value Ref Range   Lactic Acid, Venous 2.4 (HH) 0.5 - 1.9 mmol/L    Comment: CRITICAL VALUE NOTED.  VALUE IS CONSISTENT WITH PREVIOUSLY REPORTED AND CALLED VALUE. Performed at [facility], [address] [place], [place] [address]   Lactic acid, plasma     Status: Abnormal   Collection Time: 07/29/20  4:19 AM  Result Value Ref Range   Lactic Acid, Venous 3.2 (HH) 0.5 - 1.9 mmol/L    Comment: CRITICAL VALUE NOTED.  VALUE IS CONSISTENT WITH PREVIOUSLY REPORTED AND CALLED VALUE. Performed at [facility], [address], [place], [place] [address]   CBC     Status: Abnormal   Collection Time: 07/29/20  4:19 AM  Result Value Ref Range   WBC 4.7 4.0 - 10.5 K/uL   RBC 2.75 (L) 4.22 - 5.81 MIL/uL   Hemoglobin 9.6 (L) 13.0 - 17.0 g/dL   HCT 70.0 (L) 39 - 52 %   MCV 102.9 (H) 80.0 - 100.0 fL   MCH 34.9 (H) 26.0 - 34.0 pg   MCHC 33.9 30.0 - 36.0 g/dL   RDW 17.4 94.4 - 96.7 %   Platelets 155 150 - 400 K/uL   nRBC 0.0 0.0 - 0.2 %    Comment: Performed at [facility], [address], [place], [place] [address]  Comprehensive metabolic panel     Status: Abnormal   Collection Time: 07/29/20  4:19 AM  Result Value Ref Range   Sodium 132 (L) 135 - 145 mmol/L   Potassium 3.3 (L) 3.5 - 5.1 mmol/L   Chloride 101 98 - 111 mmol/L   CO2 22 22 - 32 mmol/L   Glucose, Bld 99 70 - 99 mg/dL    Comment: Glucose reference range applies only to samples taken after fasting for at least 8 hours.   BUN <5 (L) 6 - 20 mg/dL   Creatinine, Ser 8.46 0.61 - 1.24  mg/dL   Calcium 7.8 (L) 8.9 - 10.3 mg/dL   Total Protein 6.7 6.5 - 8.1 g/dL   Albumin 1.9 (L) 3.5 - 5.0 g/dL   AST 78 (H) 15 - 41 U/L   ALT 38 0 - 44 U/L   Alkaline Phosphatase 139 (H) 38 - 126 U/L   Total Bilirubin 0.9 0.3 - 1.2 mg/dL   GFR calc non Af Amer >60 >60 mL/min   GFR calc Af Amer >60 >60 mL/min   Anion gap 9 5 - 15    Comment: Performed at [facility], [address] [place], [place] [address]  Lactic acid, plasma     Status: Abnormal   Collection Time: 07/29/20 12:16 PM  Result Value  Ref Range   Lactic Acid, Venous 2.5 (HH) 0.5 - 1.9 mmol/L    Comment: CRITICAL VALUE NOTED.  VALUE IS CONSISTENT WITH PREVIOUSLY REPORTED AND CALLED VALUE. Performed at [facility], [address], [place], [place] [address]     MICRO:  IMAGING: CT Head Wo Contrast  Result Date: 07/28/2020 CLINICAL DATA:  Lower extremity pain EXAM: CT HEAD WITHOUT CONTRAST TECHNIQUE: Contiguous axial images were obtained from the base of the skull through the vertex without intravenous contrast. COMPARISON:  None. FINDINGS: Brain: No evidence of acute infarction, hemorrhage, hydrocephalus, extra-axial collection or mass lesion/mass effect. Vascular: No hyperdense vessel or unexpected calcification. Skull: Normal. Negative for fracture or focal lesion. Sinuses/Orbits: The visualized paranasal sinuses are essentially clear. The mastoid air cells are unopacified. Other: None. IMPRESSION: Normal head CT. Electronically Signed   By: Charline Bills M.D.   On: 07/28/2020 18:46   MR BRAIN WO CONTRAST  Result Date: 07/28/2020 CLINICAL DATA:  Motor neuron disease.  Lower extremity pain. EXAM: MRI HEAD WITHOUT CONTRAST TECHNIQUE: Multiplanar, multiecho pulse sequences of the brain and surrounding structures were obtained without intravenous contrast. COMPARISON:  None. FINDINGS: BRAIN: No acute infarct, acute hemorrhage or extra-axial collection. Normal white matter signal.  Normal volume of CSF spaces. No chronic microhemorrhage. Normal midline structures. VASCULAR: Major flow voids are preserved. SKULL AND UPPER CERVICAL SPINE: Normal calvarium and skull base. Visualized upper cervical spine and soft tissues are normal. SINUSES/ORBITS: No paranasal sinus fluid levels or advanced mucosal thickening. No mastoid or middle ear effusion. Normal orbits. IMPRESSION: Normal brain MRI. Electronically Signed   By: Deatra Robinson M.D.   On: 07/28/2020 23:03   MR CERVICAL SPINE W WO CONTRAST  Result Date: 07/29/2020 CLINICAL DATA:  Lower extremity pain bilaterally.  HIV/AIDS EXAM: MRI CERVICAL, THORACIC AND LUMBAR SPINE WITHOUT AND WITH CONTRAST TECHNIQUE: Multiplanar and multiecho pulse sequences of the cervical spine, to include the craniocervical junction and cervicothoracic junction, and thoracic and lumbar spine, were obtained without and with intravenous contrast. CONTRAST:  7mL GADAVIST GADOBUTROL 1 MMOL/ML IV SOLN COMPARISON:  None. FINDINGS: MRI CERVICAL SPINE FINDINGS Alignment: Normal Vertebrae: No fracture, evidence of discitis, or bone lesion. Cord: Normal Posterior Fossa, vertebral arteries, paraspinal tissues: Negative. Disc levels: There is no disc herniation. No spinal canal or neural foraminal stenosis. No abnormal contrast enhancement. MRI THORACIC SPINE FINDINGS Alignment:  Physiologic. Vertebrae: No fracture, evidence of discitis, or bone lesion. Cord:  Normal signal and morphology. Paraspinal and other soft tissues: Negative. Disc levels: No disc herniation, spinal canal stenosis or neural foraminal stenosis. No abnormal contrast enhancement. MRI LUMBAR SPINE FINDINGS Segmentation:  Standard Alignment:  Normal Vertebrae:  No fracture, evidence of discitis, or bone lesion. Conus medullaris and cauda equina: Conus extends to the L1 level. Conus and cauda equina appear normal. Paraspinal and other soft tissues: Negative Disc levels: All lumbar levels are normal. There is no  disc herniation, spinal canal stenosis or neural foraminal stenosis. No abnormal contrast enhancement. IMPRESSION: Normal MRI of the cervical, thoracic and lumbar spine. Electronically Signed   By: Deatra Robinson M.D.   On: 07/29/2020 00:41   MR THORACIC SPINE W WO CONTRAST  Result Date: 07/29/2020 CLINICAL DATA:  Lower extremity pain bilaterally.  HIV/AIDS EXAM: MRI CERVICAL, THORACIC AND LUMBAR SPINE WITHOUT AND WITH CONTRAST TECHNIQUE: Multiplanar and multiecho pulse sequences of the cervical spine, to include the craniocervical junction and cervicothoracic junction, and thoracic and lumbar spine, were obtained without and with intravenous contrast. CONTRAST:  7mL GADAVIST GADOBUTROL 1 MMOL/ML IV SOLN COMPARISON:  None. FINDINGS: MRI CERVICAL SPINE FINDINGS Alignment: Normal Vertebrae: No fracture, evidence of discitis, or bone lesion. Cord: Normal Posterior Fossa, vertebral arteries, paraspinal tissues: Negative. Disc levels: There is no disc herniation. No spinal canal or neural foraminal stenosis. No abnormal contrast enhancement. MRI THORACIC SPINE FINDINGS Alignment:  Physiologic. Vertebrae: No fracture, evidence of discitis, or bone lesion. Cord:  Normal signal and morphology. Paraspinal and other soft tissues: Negative. Disc levels: No disc herniation, spinal canal stenosis or neural foraminal stenosis. No abnormal contrast enhancement. MRI LUMBAR SPINE FINDINGS Segmentation:  Standard Alignment:  Normal Vertebrae:  No fracture, evidence of discitis, or bone lesion. Conus medullaris and cauda equina: Conus extends to the L1 level. Conus and cauda equina appear normal. Paraspinal and other soft tissues: Negative Disc levels: All lumbar levels are normal. There is no disc herniation, spinal canal stenosis or neural foraminal stenosis. No abnormal contrast enhancement. IMPRESSION: Normal MRI of the cervical, thoracic and lumbar spine. Electronically Signed   By: Deatra Robinson M.D.   On: 07/29/2020 00:41    MR Lumbar Spine W Wo Contrast  Result Date: 07/29/2020 CLINICAL DATA:  Lower extremity pain bilaterally.  HIV/AIDS EXAM: MRI CERVICAL, THORACIC AND LUMBAR SPINE WITHOUT AND WITH CONTRAST TECHNIQUE: Multiplanar and multiecho pulse sequences of the cervical spine, to include the craniocervical junction and cervicothoracic junction, and thoracic and lumbar spine, were obtained without and with intravenous contrast. CONTRAST:  7mL GADAVIST GADOBUTROL 1 MMOL/ML IV SOLN COMPARISON:  None. FINDINGS: MRI CERVICAL SPINE FINDINGS Alignment: Normal Vertebrae: No fracture, evidence of discitis, or bone lesion. Cord: Normal Posterior Fossa, vertebral arteries, paraspinal tissues: Negative. Disc levels: There is no disc herniation. No spinal canal or neural foraminal stenosis. No abnormal contrast enhancement. MRI THORACIC SPINE FINDINGS Alignment:  Physiologic. Vertebrae: No fracture, evidence of discitis, or bone lesion. Cord:  Normal signal and morphology. Paraspinal and other soft tissues: Negative. Disc levels: No disc herniation, spinal canal stenosis or neural foraminal stenosis. No abnormal contrast enhancement. MRI LUMBAR SPINE FINDINGS Segmentation:  Standard Alignment:  Normal Vertebrae:  No fracture, evidence of discitis, or bone lesion. Conus medullaris and cauda equina: Conus extends to the L1 level. Conus and cauda equina appear normal. Paraspinal and other soft tissues: Negative Disc levels: All lumbar levels are normal. There is no disc herniation, spinal canal stenosis or neural foraminal stenosis. No abnormal contrast enhancement. IMPRESSION: Normal MRI of the cervical, thoracic and lumbar spine. Electronically Signed   By: Deatra Robinson M.D.   On: 07/29/2020 00:41   DG Chest Port 1 View  Result Date: 07/28/2020 CLINICAL DATA:  Cough, weakness EXAM: PORTABLE CHEST 1 VIEW COMPARISON:  11/29/2017 FINDINGS: Lungs are clear.  No pleural effusion or pneumothorax. The heart is normal in size. IMPRESSION: No  evidence of acute cardiopulmonary disease. Electronically Signed   By: Charline Bills M.D.   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 Recommend to evaluate by neuro for PLEX? Steroids?  Does not need antibiotics, please discontinue -will check VDRL from CSF  In terms of hiv disease = continue with biktarvy daily. Will need to check cd 4 count and viral load  oi proph = continue with bactrim ds daily  Hx of syphilis = appears he responded to treatment from  February but unclear if he has had repeat infection.  will discuss need for treatment.

## 2020-07-29 NOTE — Progress Notes (Signed)
NIF-34 FVC 2.8L

## 2020-07-29 NOTE — Progress Notes (Signed)
Best of 3 attempts: NIF -40 (+) FVC 2.5 L

## 2020-07-29 NOTE — Progress Notes (Signed)
Best of 3 attempts NIF= -40 (+) FVC=2.6 L

## 2020-07-29 NOTE — Progress Notes (Signed)
NIF-40+ FVC 2.4L with fair effort

## 2020-07-30 DIAGNOSIS — R29898 Other symptoms and signs involving the musculoskeletal system: Secondary | ICD-10-CM

## 2020-07-30 DIAGNOSIS — B20 Human immunodeficiency virus [HIV] disease: Secondary | ICD-10-CM

## 2020-07-30 DIAGNOSIS — Z8619 Personal history of other infectious and parasitic diseases: Secondary | ICD-10-CM

## 2020-07-30 LAB — CBC WITH DIFFERENTIAL/PLATELET
Abs Immature Granulocytes: 0.02 10*3/uL (ref 0.00–0.07)
Basophils Absolute: 0 10*3/uL (ref 0.0–0.1)
Basophils Relative: 1 %
Eosinophils Absolute: 0.1 10*3/uL (ref 0.0–0.5)
Eosinophils Relative: 3 %
HCT: 22.3 % — ABNORMAL LOW (ref 39.0–52.0)
Hemoglobin: 7.6 g/dL — ABNORMAL LOW (ref 13.0–17.0)
Immature Granulocytes: 1 %
Lymphocytes Relative: 49 %
Lymphs Abs: 1.6 10*3/uL (ref 0.7–4.0)
MCH: 36.2 pg — ABNORMAL HIGH (ref 26.0–34.0)
MCHC: 34.1 g/dL (ref 30.0–36.0)
MCV: 106.2 fL — ABNORMAL HIGH (ref 80.0–100.0)
Monocytes Absolute: 0.3 10*3/uL (ref 0.1–1.0)
Monocytes Relative: 10 %
Neutro Abs: 1.2 10*3/uL — ABNORMAL LOW (ref 1.7–7.7)
Neutrophils Relative %: 36 %
Platelets: 128 10*3/uL — ABNORMAL LOW (ref 150–400)
RBC: 2.1 MIL/uL — ABNORMAL LOW (ref 4.22–5.81)
RDW: 15.2 % (ref 11.5–15.5)
WBC: 3.3 10*3/uL — ABNORMAL LOW (ref 4.0–10.5)
nRBC: 0 % (ref 0.0–0.2)

## 2020-07-30 LAB — COMPREHENSIVE METABOLIC PANEL
ALT: 38 U/L (ref 0–44)
AST: 62 U/L — ABNORMAL HIGH (ref 15–41)
Albumin: 1.9 g/dL — ABNORMAL LOW (ref 3.5–5.0)
Alkaline Phosphatase: 114 U/L (ref 38–126)
Anion gap: 7 (ref 5–15)
BUN: <5 mg/dL — ABNORMAL LOW (ref 6–20)
CO2: 19 mmol/L — ABNORMAL LOW (ref 22–32)
Calcium: 7.6 mg/dL — ABNORMAL LOW (ref 8.9–10.3)
Chloride: 109 mmol/L (ref 98–111)
Creatinine, Ser: 0.51 mg/dL — ABNORMAL LOW (ref 0.61–1.24)
GFR calc Af Amer: >60 mL/min (ref >60)
GFR calc non Af Amer: >60 mL/min (ref >60)
Glucose, Bld: 84 mg/dL (ref 70–99)
Potassium: 3.7 mmol/L (ref 3.5–5.1)
Sodium: 135 mmol/L (ref 135–145)
Total Bilirubin: 0.5 mg/dL (ref 0.3–1.2)
Total Protein: 6.1 g/dL — ABNORMAL LOW (ref 6.5–8.1)

## 2020-07-30 LAB — IGG CSF INDEX
Albumin CSF-mCnc: 10 mg/dL (ref 10–45)
Albumin: 2.8 g/dL — ABNORMAL LOW (ref 4.0–5.0)
CSF IgG Index: 1.3 — ABNORMAL HIGH (ref 0.0–0.7)
IgG (Immunoglobin G), Serum: 3055 mg/dL — ABNORMAL HIGH (ref 603–1613)
IgG, CSF: 14.3 mg/dL — ABNORMAL HIGH (ref 0.0–10.3)
IgG/Alb Ratio, CSF: 1.43 — ABNORMAL HIGH (ref 0.00–0.25)

## 2020-07-30 LAB — HIV-1 RNA QUANT-NO REFLEX-BLD
HIV 1 RNA Quant: 23300 copies/mL
LOG10 HIV-1 RNA: 4.367 log10copy/mL

## 2020-07-30 LAB — T-HELPER CELLS (CD4) COUNT (NOT AT ARMC)
CD4 % Helper T Cell: 6 % — ABNORMAL LOW (ref 33–65)
CD4 T Cell Abs: 90 /uL — ABNORMAL LOW (ref 400–1790)

## 2020-07-30 LAB — EPSTEIN BARR VRS(EBV DNA BY PCR)
EBV DNA QN by PCR: UNDETERMINED copies/mL
log10 EBV DNA Qn PCR: UNDETERMINED

## 2020-07-30 LAB — LACTIC ACID, PLASMA
Lactic Acid, Venous: 4.2 mmol/L (ref 0.5–1.9)
Lactic Acid, Venous: 2.3 mmol/L (ref 0.5–1.9)

## 2020-07-30 LAB — MAGNESIUM: Magnesium: 1.6 mg/dL — ABNORMAL LOW (ref 1.7–2.4)

## 2020-07-30 LAB — PHOSPHORUS: Phosphorus: 3.5 mg/dL (ref 2.5–4.6)

## 2020-07-30 LAB — VDRL, CSF: VDRL Quant, CSF: NONREACTIVE

## 2020-07-30 LAB — T.PALLIDUM AB, TOTAL: T Pallidum Abs: REACTIVE — AB

## 2020-07-30 LAB — HEMOGLOBIN AND HEMATOCRIT, BLOOD
HCT: 29.9 % — ABNORMAL LOW (ref 39.0–52.0)
Hemoglobin: 10.2 g/dL — ABNORMAL LOW (ref 13.0–17.0)

## 2020-07-30 MED ORDER — MAGNESIUM SULFATE 4 GM/100ML IV SOLN
4.0000 g | Freq: Once | INTRAVENOUS | Status: AC
  Administered 2020-07-30: 4 g via INTRAVENOUS
  Filled 2020-07-30: qty 100

## 2020-07-30 MED ORDER — IMMUNE GLOBULIN (HUMAN) 10 GM/100ML IV SOLN: 400.0000 mg/kg | INTRAVENOUS | Status: DC

## 2020-07-30 MED ORDER — ENSURE ENLIVE PO LIQD
237.0000 mL | Freq: Two times a day (BID) | ORAL | Status: DC
  Administered 2020-07-31 – 2020-08-02 (×4): 237 mL via ORAL
  Filled 2020-07-30: qty 237

## 2020-07-30 MED ORDER — IMMUNE GLOBULIN (HUMAN) 10 GM/100ML IV SOLN
400.0000 mg/kg | INTRAVENOUS | Status: DC
  Filled 2020-07-30: qty 300

## 2020-07-30 MED ORDER — IMMUNE GLOBULIN (HUMAN) 10 GM/100ML IV SOLN
25.0000 g | INTRAVENOUS | Status: AC
  Administered 2020-07-31 – 2020-08-03 (×5): 25 g via INTRAVENOUS
  Filled 2020-07-30 (×6): qty 200
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
## 2020-07-30 NOTE — ED Notes (Signed)
[facility] called. 

## 2020-07-30 NOTE — Progress Notes (Signed)
Received consult to start IVIG. Call made to RN to verify if pt ready. IVIG not yet available; answered RN questions. RN states will work with pharmacy to get dose to floor; RN agrees to consult when IVIG ready or for any new questions.

## 2020-07-30 NOTE — ED Notes (Signed)
Rn attempted to call report, receiving RN unable to take report. Will try to call back.

## 2020-07-30 NOTE — ED Notes (Signed)
Dr Margo Aye aware of Lactic 2.3. Continue fluids

## 2020-07-30 NOTE — Progress Notes (Signed)
[facility]    Date of Admission:  07/28/2020   Total days of antibiotics 1  ID: Travis Mcgee is a 39 y.o. male with  hiv disease, bilateral leg weakness Principal Problem:   Bilateral leg weakness Active Problems:   AIDS (acquired immune deficiency syndrome) (HCC)   History of syphilis   Lactic acidosis    Subjective: No fever, nor shortness of breath but still sensitive to touch bilateral legs and weakness is unchanged  Medications:  . bictegravir-emtricitabine-tenofovir AF  1 tablet Oral Daily  . enoxaparin (LOVENOX) injection  40 mg Subcutaneous Q24H  . feeding supplement (ENSURE ENLIVE)  237 mL Oral BID BM  . sulfamethoxazole-trimethoprim  1 tablet Oral Daily    Objective: Vital signs in last 24 hours: Pulse Rate:  [74-95] 80 (10/04 1527) Resp:  [13-24] 20 (10/04 1527) BP: (104-129)/(69-96) 118/72 (10/04 1527) SpO2:  [96 %-100 %] 99 % (10/04 1527) Physical Exam  Constitutional: He is oriented to person, place, and time. He appears well-developed and well-nourished. No distress.  HENT:  Mouth/Throat: Oropharynx is clear and moist. No oropharyngeal exudate.  Cardiovascular: Normal rate, regular rhythm and normal heart sounds. Exam reveals no gallop and no friction rub.  No murmur heard.  Pulmonary/Chest: Effort normal and breath sounds normal. No respiratory distress. He has no wheezes.  Abdominal: Soft. Bowel sounds are normal. He exhibits no distension. There is no tenderness.  Lymphadenopathy:  He has no cervical adenopathy.  Neurological: He is alert and oriented to person, place, and time. Proximal weakness to lower extremities> distal muscle groups. + sensitive to touch.  Skin: Skin is warm and dry. No rash noted. No erythema.  Psychiatric: He has a normal mood and affect. His behavior is normal.     Lab Results Recent Labs    07/29/20 0419 07/29/20 0419 07/30/20 0623 07/30/20 1055  WBC 4.7  --  3.3*  --   HGB 9.6*   < >  7.6* 10.2*  HCT 28.3*   < > 22.3* 29.9*  NA 132*  --  135  --   K 3.3*  --  3.7  --   CL 101  --  109  --   CO2 22  --  19*  --   BUN <5*  --  <5*  --   CREATININE 0.63  --  0.51*  --    < > = values in this interval not displayed.   Liver Panel Recent Labs    07/29/20 0419 07/30/20 0623  PROT 6.7 6.1*  ALBUMIN 1.9* 1.9*  AST 78* 62*  ALT 38 38  ALKPHOS 139* 114  BILITOT 0.9 0.5   Sedimentation Rate Recent Labs    07/28/20 2128  ESRSEDRATE 98*   C-Reactive Protein Recent Labs    07/28/20 2128  CRP 3.6*    Microbiology: reviewed Studies/Results: CT Head Wo Contrast  Result Date: 07/28/2020 CLINICAL DATA:  Lower extremity pain EXAM: CT HEAD WITHOUT CONTRAST TECHNIQUE: Contiguous axial images were obtained from the base of the skull through the vertex without intravenous contrast. COMPARISON:  None. FINDINGS: Brain: No evidence of acute infarction, hemorrhage, hydrocephalus, extra-axial collection or mass lesion/mass effect. Vascular: No hyperdense vessel or unexpected calcification. Skull: Normal. Negative for fracture or focal lesion. Sinuses/Orbits: The visualized paranasal sinuses are essentially clear. The mastoid air cells are unopacified. Other: None. IMPRESSION: Normal head CT. Electronically Signed   By: Charline Bills M.D.   On: 07/28/2020 18:46   MR  BRAIN WO CONTRAST  Result Date: 07/28/2020 CLINICAL DATA:  Motor neuron disease.  Lower extremity pain. EXAM: MRI HEAD WITHOUT CONTRAST TECHNIQUE: Multiplanar, multiecho pulse sequences of the brain and surrounding structures were obtained without intravenous contrast. COMPARISON:  None. FINDINGS: BRAIN: No acute infarct, acute hemorrhage or extra-axial collection. Normal white matter signal. Normal volume of CSF spaces. No chronic microhemorrhage. Normal midline structures. VASCULAR: Major flow voids are preserved. SKULL AND UPPER CERVICAL SPINE: Normal calvarium and skull base. Visualized upper cervical spine and  soft tissues are normal. SINUSES/ORBITS: No paranasal sinus fluid levels or advanced mucosal thickening. No mastoid or middle ear effusion. Normal orbits. IMPRESSION: Normal brain MRI. Electronically Signed   By: Deatra Robinson M.D.   On: 07/28/2020 23:03   MR CERVICAL SPINE W WO CONTRAST  Result Date: 07/29/2020 CLINICAL DATA:  Lower extremity pain bilaterally.  HIV/AIDS EXAM: MRI CERVICAL, THORACIC AND LUMBAR SPINE WITHOUT AND WITH CONTRAST TECHNIQUE: Multiplanar and multiecho pulse sequences of the cervical spine, to include the craniocervical junction and cervicothoracic junction, and thoracic and lumbar spine, were obtained without and with intravenous contrast. CONTRAST:  9mL GADAVIST GADOBUTROL 1 MMOL/ML IV SOLN COMPARISON:  None. FINDINGS: MRI CERVICAL SPINE FINDINGS Alignment: Normal Vertebrae: No fracture, evidence of discitis, or bone lesion. Cord: Normal Posterior Fossa, vertebral arteries, paraspinal tissues: Negative. Disc levels: There is no disc herniation. No spinal canal or neural foraminal stenosis. No abnormal contrast enhancement. MRI THORACIC SPINE FINDINGS Alignment:  Physiologic. Vertebrae: No fracture, evidence of discitis, or bone lesion. Cord:  Normal signal and morphology. Paraspinal and other soft tissues: Negative. Disc levels: No disc herniation, spinal canal stenosis or neural foraminal stenosis. No abnormal contrast enhancement. MRI LUMBAR SPINE FINDINGS Segmentation:  Standard Alignment:  Normal Vertebrae:  No fracture, evidence of discitis, or bone lesion. Conus medullaris and cauda equina: Conus extends to the L1 level. Conus and cauda equina appear normal. Paraspinal and other soft tissues: Negative Disc levels: All lumbar levels are normal. There is no disc herniation, spinal canal stenosis or neural foraminal stenosis. No abnormal contrast enhancement. IMPRESSION: Normal MRI of the cervical, thoracic and lumbar spine. Electronically Signed   By: Deatra Robinson M.D.   On:  07/29/2020 00:41   MR THORACIC SPINE W WO CONTRAST  Result Date: 07/29/2020 CLINICAL DATA:  Lower extremity pain bilaterally.  HIV/AIDS EXAM: MRI CERVICAL, THORACIC AND LUMBAR SPINE WITHOUT AND WITH CONTRAST TECHNIQUE: Multiplanar and multiecho pulse sequences of the cervical spine, to include the craniocervical junction and cervicothoracic junction, and thoracic and lumbar spine, were obtained without and with intravenous contrast. CONTRAST:  17mL GADAVIST GADOBUTROL 1 MMOL/ML IV SOLN COMPARISON:  None. FINDINGS: MRI CERVICAL SPINE FINDINGS Alignment: Normal Vertebrae: No fracture, evidence of discitis, or bone lesion. Cord: Normal Posterior Fossa, vertebral arteries, paraspinal tissues: Negative. Disc levels: There is no disc herniation. No spinal canal or neural foraminal stenosis. No abnormal contrast enhancement. MRI THORACIC SPINE FINDINGS Alignment:  Physiologic. Vertebrae: No fracture, evidence of discitis, or bone lesion. Cord:  Normal signal and morphology. Paraspinal and other soft tissues: Negative. Disc levels: No disc herniation, spinal canal stenosis or neural foraminal stenosis. No abnormal contrast enhancement. MRI LUMBAR SPINE FINDINGS Segmentation:  Standard Alignment:  Normal Vertebrae:  No fracture, evidence of discitis, or bone lesion. Conus medullaris and cauda equina: Conus extends to the L1 level. Conus and cauda equina appear normal. Paraspinal and other soft tissues: Negative Disc levels: All lumbar levels are normal. There is no disc herniation, spinal canal stenosis or  neural foraminal stenosis. No abnormal contrast enhancement. IMPRESSION: Normal MRI of the cervical, thoracic and lumbar spine. Electronically Signed   By: Deatra Robinson M.D.   On: 07/29/2020 00:41   MR Lumbar Spine W Wo Contrast  Result Date: 07/29/2020 CLINICAL DATA:  Lower extremity pain bilaterally.  HIV/AIDS EXAM: MRI CERVICAL, THORACIC AND LUMBAR SPINE WITHOUT AND WITH CONTRAST TECHNIQUE: Multiplanar and  multiecho pulse sequences of the cervical spine, to include the craniocervical junction and cervicothoracic junction, and thoracic and lumbar spine, were obtained without and with intravenous contrast. CONTRAST:  26mL GADAVIST GADOBUTROL 1 MMOL/ML IV SOLN COMPARISON:  None. FINDINGS: MRI CERVICAL SPINE FINDINGS Alignment: Normal Vertebrae: No fracture, evidence of discitis, or bone lesion. Cord: Normal Posterior Fossa, vertebral arteries, paraspinal tissues: Negative. Disc levels: There is no disc herniation. No spinal canal or neural foraminal stenosis. No abnormal contrast enhancement. MRI THORACIC SPINE FINDINGS Alignment:  Physiologic. Vertebrae: No fracture, evidence of discitis, or bone lesion. Cord:  Normal signal and morphology. Paraspinal and other soft tissues: Negative. Disc levels: No disc herniation, spinal canal stenosis or neural foraminal stenosis. No abnormal contrast enhancement. MRI LUMBAR SPINE FINDINGS Segmentation:  Standard Alignment:  Normal Vertebrae:  No fracture, evidence of discitis, or bone lesion. Conus medullaris and cauda equina: Conus extends to the L1 level. Conus and cauda equina appear normal. Paraspinal and other soft tissues: Negative Disc levels: All lumbar levels are normal. There is no disc herniation, spinal canal stenosis or neural foraminal stenosis. No abnormal contrast enhancement. IMPRESSION: Normal MRI of the cervical, thoracic and lumbar spine. Electronically Signed   By: Deatra Robinson M.D.   On: 07/29/2020 00:41     Assessment/Plan: hiv disease= cd 4 count appears at baseline of 90 but VL 23,000 -c/w his report of not being on antivirals for a few weeks. Continue on biktarvy plus bactrim for oi proph  Subacute lower extremity weakness and parasthesias = concern for Guillan barre or other AIDP. Neurology consult team to see while at [facility] since unclear when he will transfer to [facility].  Hx of syphilis = recent titer of 1:16 reveals response from prior treatment in  feb/march. Consider giving additional dose PCN after further discussion of sexual history  [facility] Cell: [phone] Pager: [phone]  07/30/2020, 5:10 PM

## 2020-07-30 NOTE — ED Notes (Signed)
Neuro consult @ bedside
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
## 2020-07-30 NOTE — Progress Notes (Addendum)
PROGRESS NOTE  Travis Mcgee FYB:[id number] DOB: 1981-06-03 DOA: 07/28/2020 PCP: Patient, No Pcp Per  HPI/Recap of past 24 hours: HPI: Travis Mcgee is a 39 y.o. male with medical history significant of HIV/AIDS had been on HAART till he ran out 2 weeks prior to his presentation, syphilis treated with PCN earlier this year.  Presents to [facility] ED with c/o pain of bilateral lower extremity weakness and pain.  He reports that over the past 7 to 10 days he has had worsening bilateral lower extremity pain, paresthesias and weakness. Has now progressed to the point that he is falling. He fell 1 day prior and then again on the day of his presentation.  Denies any injuries or striking his head from the fall.  Denies any GI symptoms.  Traveled about 1 hour away from [place] the past 10 days.  Hereports that a few hours prior he started noticing a change in his voice that it became more hoarse. He reports a cough at baseline that is unchanged.  No known tick bites.   ED Course: Lactate 2.7, 4.5 on repeat.  WBC nl, P 108 but no other SIRS.  CT head neg. Tele neuro consulted, see their note.  ID consulted.  LP performed, MRI brain, cervical, thoracic, and lumbar spine were unrevealing.  Plan is to transfer to [facility], neuro center.  07/30/20: Seen and examined at his bedside in the ED.  Reports persistent bilateral lower extremity pain and weakness.  Physical exam revealed mild improvement in his weakness.  With some effort is able to lift his legs off the bed, 1 at a time.  She still has significant pain.  No swelling noted.  Upper extremity sensory and strength intact.  O2 saturation 100% on room air.  Respiration rate 22 in the room.  Denies any chest pain or dyspnea.   Assessment/Plan: Principal Problem:   Bilateral leg weakness Active Problems:   AIDS (acquired immune deficiency syndrome) (HCC)   History of syphilis   Lactic acidosis  Bilateral lower extremity pain, paresthesia,  weakness Unclear etiology Work-up thus far unrevealing LP performed, MRI brain, cervical, thoracic, and lumbar spine unrevealing. Unclear if this is Guillain-Barr syndrome, defer to neurology to start IVIG if indicated.   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 Continue NIF and VC, currently not hypoxic, ABG done on 07/28/2020: 7.521/32.6/84.3 on room air.  O2 saturation 100% on room air. Continue neurochecks and close monitoring.  Fall precautions. Neurology consulted, please contact neurology once the patient arrives at [facility]. IV antibiotics stopped as recommended by ID.  Lactic acidosis, unclear etiology Presented with lactic acidosis, 4.5. Now uptrending 4.2 from 2.5 Continue IV fluids Repeat lactic acid level, cycle and follow results. Infectious disease following.  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 Continue to closely monitor, trend NIF and VC  History of treated syphylis, February 2021 Reactive RPR with titer 1:16 on 07/28/20 Unclear if he has recurrent syphilis ID following  Isolated elevated AST Levels are  downtrending Continue to monitor  Resolved hypovolemic hyponatremia Hypovolemic on exam Presented with serum sodium of 130 Serum sodium uptrending, 135 from 132 Continue lactated Ringer 125 cc/h started in the ED.  Resolved post repletion: Hypokalemia Serum potassium  3.3> 3.7. Replete as indicated.  Severe protein calorie malnutrition BMI 20 Albumin 1.9 Continue oral supplement Encourage oral intake as tolerated.  HIV Ran out of his HAART medication 2 weeks prior to presentation. Continue Biktarvy as recommended by infectious disease. Infectious disease following.       Code Status: Full code.  Family Communication: None at bedside.  Disposition Plan: Home possibly with home health services PT OT when hemodynamically stable.   Consultants:  Neurology  Infectious disease  Procedures:  Lumbar puncture on 07/28/2020.  Antimicrobials:  Received 1 day of IV vancomycin and Rocephin.  Discontinued on 08/15/2020.  DVT prophylaxis: Subcu Lovenox daily  Status is: Observation   Dispo:  Patient From: Home  Planned Disposition: Home with Health Care Svc  Expected discharge date: 08/01/20  Medically stable for discharge: No ongoing management of bilateral lower extremity weakness with ongoing work-up.    Objective: Vitals:   07/30/20 0745 07/30/20 0800 07/30/20 0900 07/30/20 1030  BP: 116/78 105/72 109/72 109/69  Pulse: 74 75 74 90  Resp: 18 20 (!) 24 (!) 24  Temp:      TempSrc:      SpO2: 100% 100% 98% 100%  Weight:      Height:        Intake/Output Summary (Last 24 hours) at 07/30/2020 1110 Last data filed at 07/29/2020 1253 Gross per 24 hour  Intake 416.21 ml  Output --  Net 416.21 ml   Filed Weights   07/28/20 1352  Weight: 65.8 kg    Exam:   General: 40 y.o. year-old male pleasant well-developed well-nourished in no acute distress.  Alert and oriented x3.    Cardiovascular: Regular rate and rhythm no rubs or gallops.    Respiratory: Clear to auscultation with no wheezes no rales.  Good inspiratory effort.    Abdomen: Soft nontender normal bowel sounds present.    Musculoskeletal: No lower extremity edema bilaterally.  Tenderness with minimal touch.  3 out of 5 strength in lower extremities  bilaterally, improved from prior exam.    Psychiatry: Mood is appropriate for condition and setting.   Data Reviewed: CBC: Recent Labs  Lab 07/28/20 1303 07/29/20 0419 07/30/20 0623  WBC 6.9 4.7 3.3*  NEUTROABS 4.6  --  1.2*  HGB 11.5* 9.6* 7.6*  HCT 33.4* 28.3* 22.3*  MCV 102.5* 102.9* 106.2*  PLT 195 155 128*   Basic Metabolic Panel: Recent Labs  Lab 07/28/20 1303 07/28/20 1314 07/29/20 0419 07/30/20 0623  NA 130*  --  132* 135  K 3.8  --  3.3* 3.7  CL 95*  --  101 109  CO2 23  --  22 19*  GLUCOSE 73  --  99 84  BUN 7  --  <5* <5*  CREATININE 0.64  --  0.63 0.51*  CALCIUM 7.9*  --  7.8* 7.6*  MG  --  1.8  --  1.6*  PHOS  --   --   --  3.5   GFR: Estimated Creatinine Clearance: 114.2 mL/min (A) (by C-G formula based on SCr of 0.51 mg/dL (L)). Liver Function Tests: Recent Labs  Lab 07/28/20 1303 07/29/20 0419 07/30/20 0623  AST 98* 78* 62*  ALT 41 38 38  ALKPHOS 190* 139* 114  BILITOT  2.4* 0.9 0.5  PROT 8.2* 6.7 6.1*  ALBUMIN 2.4* 1.9* 1.9*   No results for input(s): LIPASE, AMYLASE in the last 168 hours. No results for input(s): AMMONIA in the last 168 hours. Coagulation Profile: No results for input(s): INR, PROTIME in the last 168 hours. Cardiac Enzymes: Recent Labs  Lab 07/28/20 1303  CKTOTAL 122   BNP (last 3 results) No results for input(s): PROBNP in the last 8760 hours. HbA1C: No results for input(s): HGBA1C in the last 72 hours. CBG: No results for input(s): GLUCAP in the last 168 hours. Lipid Profile: No results for input(s): CHOL, HDL, LDLCALC, TRIG, CHOLHDL, LDLDIRECT in the last 72 hours. Thyroid Function Tests: Recent Labs    07/28/20 2128  TSH 1.886   Anemia Panel: No results for input(s): VITAMINB12, FOLATE, FERRITIN, TIBC, IRON, RETICCTPCT in the last 72 hours. Urine analysis:    Component Value Date/Time   COLORURINE AMBER (A) 07/28/2020 1303   APPEARANCEUR CLEAR 07/28/2020 1303   LABSPEC 1.012 07/28/2020 1303    PHURINE 6.0 07/28/2020 1303   GLUCOSEU NEGATIVE 07/28/2020 1303   HGBUR NEGATIVE 07/28/2020 1303   BILIRUBINUR SMALL (A) 07/28/2020 1303   KETONESUR NEGATIVE 07/28/2020 1303   PROTEINUR NEGATIVE 07/28/2020 1303   NITRITE NEGATIVE 07/28/2020 1303   LEUKOCYTESUR TRACE (A) 07/28/2020 1303   Sepsis Labs: @LABRCNTIP (procalcitonin:4,lacticidven:4)  ) Recent Results (from the past 240 hour(s))  Respiratory Panel by RT PCR (Flu A&B, Covid) - Nasopharyngeal Swab     Status: None   Collection Time: 07/28/20  1:05 PM   Specimen: Nasopharyngeal Swab  Result Value Ref Range Status   SARS Coronavirus 2 by RT PCR NEGATIVE NEGATIVE Corrected   Influenza A by PCR NEGATIVE NEGATIVE Final   Influenza B by PCR NEGATIVE NEGATIVE Final    Comment: (NOTE) The Xpert Xpress SARS-CoV-2/FLU/RSV assay is intended as an aid in  the diagnosis of influenza from Nasopharyngeal swab specimens and  should not be used as a sole basis for treatment. Nasal washings and  aspirates are unacceptable for Xpert Xpress SARS-CoV-2/FLU/RSV  testing.  Fact Sheet for Patients: 09/27/20  Fact Sheet for Healthcare Providers: https://www.moore.com/  This test is not yet approved or cleared by the https://www.young.biz/ FDA and  has been authorized for detection and/or diagnosis of SARS-CoV-2 by  FDA under an Emergency Use Authorization (EUA). This EUA will remain  in effect (meaning this test can be used) for the duration of the  Covid-19 declaration under Section 564(b)(1) of the Act, 21  U.S.C. section 360bbb-3(b)(1), unless the authorization is  terminated or revoked. Performed at [facility], [address], [place], [place]   Culture, blood (routine x 2)     Status: None (Preliminary result)   Collection Time: 07/28/20  3:24 PM   Specimen: BLOOD RIGHT HAND  Result Value Ref Range Status   Specimen Description   Final    BLOOD RIGHT  HAND Performed at [facility], [address] [place], [place]    Special Requests   Final    BOTTLES DRAWN AEROBIC AND ANAEROBIC Blood Culture adequate volume Performed at [facility], [address], [place], [place]    Culture   Final    NO GROWTH < 24 HOURS Performed at [facility], [address] [place], [place]    Report Status PENDING  Incomplete  CSF culture     Status: None (Preliminary result)   Collection  Time: 07/28/20  8:19 PM   Specimen: CSF; Cerebrospinal Fluid  Result Value Ref Range Status   Specimen Description   Final    CSF Performed at [facility], [address] [place], [place] [address]    Special Requests   Final    Immunocompromised Performed at [facility], [address] [place], [place] [address]    Gram Stain   Final    WBC PRESENT, PREDOMINANTLY MONONUCLEAR NO ORGANISMS SEEN CYTOSPIN SMEAR    Culture   Final    NO GROWTH 2 DAYS Performed at [facility], [address] [place], [place] [address]    Report Status PENDING  Incomplete      Studies: No results found.  Scheduled Meds:  bictegravir-emtricitabine-tenofovir AF  1 tablet Oral Daily   enoxaparin (LOVENOX) injection  40 mg Subcutaneous Q24H   sulfamethoxazole-trimethoprim  1 tablet Oral Daily    Continuous Infusions:  lactated ringers Stopped (07/30/20 0956)   magnesium sulfate bolus IVPB       LOS: 2 days     Darlin Droparole N Travis Isola, MD [facility] Pager [phone]  If 7PM-7AM, please contact night-coverage [email or web address] Password [email or web address] 07/30/2020, 11:10 AM

## 2020-07-31 DIAGNOSIS — R29898 Other symptoms and signs involving the musculoskeletal system: Secondary | ICD-10-CM | POA: Diagnosis not present

## 2020-07-31 DIAGNOSIS — B20 Human immunodeficiency virus [HIV] disease: Secondary | ICD-10-CM | POA: Diagnosis not present

## 2020-07-31 LAB — LACTATE DEHYDROGENASE: LDH: 140 U/L (ref 98–192)

## 2020-07-31 LAB — CBC
HCT: 30.2 % — ABNORMAL LOW (ref 39.0–52.0)
Hemoglobin: 10 g/dL — ABNORMAL LOW (ref 13.0–17.0)
MCH: 34.5 pg — ABNORMAL HIGH (ref 26.0–34.0)
MCHC: 33.1 g/dL (ref 30.0–36.0)
MCV: 104.1 fL — ABNORMAL HIGH (ref 80.0–100.0)
Platelets: 202 10*3/uL (ref 150–400)
RBC: 2.9 MIL/uL — ABNORMAL LOW (ref 4.22–5.81)
RDW: 15.1 % (ref 11.5–15.5)
WBC: 3.9 10*3/uL — ABNORMAL LOW (ref 4.0–10.5)
nRBC: 0 % (ref 0.0–0.2)

## 2020-07-31 LAB — HEPATIC FUNCTION PANEL
ALT: 47 U/L — ABNORMAL HIGH (ref 0–44)
AST: 59 U/L — ABNORMAL HIGH (ref 15–41)
Albumin: 1.9 g/dL — ABNORMAL LOW (ref 3.5–5.0)
Alkaline Phosphatase: 125 U/L (ref 38–126)
Bilirubin, Direct: 0.1 mg/dL (ref 0.0–0.2)
Indirect Bilirubin: 0.3 mg/dL (ref 0.3–0.9)
Total Bilirubin: 0.4 mg/dL (ref 0.3–1.2)
Total Protein: 7.4 g/dL (ref 6.5–8.1)

## 2020-07-31 LAB — MAGNESIUM: Magnesium: 1.6 mg/dL — ABNORMAL LOW (ref 1.7–2.4)

## 2020-07-31 LAB — QUANTIFERON-TB GOLD PLUS (RQFGPL)
QuantiFERON Mitogen Value: 2.95 IU/mL
QuantiFERON Nil Value: 0.07 IU/mL
QuantiFERON TB1 Ag Value: 0.07 IU/mL
QuantiFERON TB2 Ag Value: 0.07 IU/mL

## 2020-07-31 LAB — IGA: IgA: 888 mg/dL — ABNORMAL HIGH (ref 90–386)

## 2020-07-31 LAB — VITAMIN B12: Vitamin B-12: 439 pg/mL (ref 180–914)

## 2020-07-31 LAB — QUANTIFERON-TB GOLD PLUS: QuantiFERON-TB Gold Plus: NEGATIVE

## 2020-07-31 LAB — TSH: TSH: 3.347 u[IU]/mL (ref 0.350–4.500)

## 2020-07-31 LAB — FOLATE: Folate: 5.6 ng/mL — ABNORMAL LOW (ref >5.9)

## 2020-07-31 LAB — SAVE SMEAR(SSMR), FOR PROVIDER SLIDE REVIEW

## 2020-07-31 LAB — FERRITIN: Ferritin: 631 ng/mL — ABNORMAL HIGH (ref 24–336)

## 2020-07-31 LAB — GLUCOSE, CAPILLARY: Glucose-Capillary: 105 mg/dL — ABNORMAL HIGH (ref 70–99)

## 2020-07-31 MED ORDER — TRAMADOL HCL 50 MG PO TABS
50.0000 mg | ORAL_TABLET | Freq: Four times a day (QID) | ORAL | Status: AC | PRN
  Administered 2020-07-31 – 2020-08-02 (×6): 50 mg via ORAL
  Filled 2020-07-31 (×6): qty 1

## 2020-07-31 MED ORDER — PREGABALIN 75 MG PO CAPS
75.0000 mg | ORAL_CAPSULE | Freq: Two times a day (BID) | ORAL | Status: DC
  Administered 2020-07-31 – 2020-08-01 (×2): 75 mg via ORAL
  Filled 2020-07-31 (×2): qty 1

## 2020-07-31 NOTE — Plan of Care (Signed)

## 2020-07-31 NOTE — Therapy (Signed)
Bedside procedure NIF: -40, VC: 3.6

## 2020-07-31 NOTE — Progress Notes (Signed)
Ambulated patient in hallway approximately 300 ft with RW.  Patient tolerated well and wants to continue ambulation to keep muscles functional.  Educated patient on safety multiple times due to high risk for fall.  Patient more concerned about showering and what ongoing treatments will be.  I explained that MD would evaluate and formulate plan of care.  Patient aware that IVF were discontinued and will continue getting IVIG every 24 hours per order.  Patient wanting information on [place].  Patient explained condition to me and progression without any input from this RN.  Patient is alert and oriented but non-compliant with safety.  Patient is not on bed alarm (refuses) and does not call when he needs to get out of bed.  Will continue to reiterate need for safety due to issues with legs.  Patient states that he will tell us when he feels another issue with his legs. Pt resting with call bell within reach.  Will continue to monitor.

## 2020-07-31 NOTE — Progress Notes (Signed)
First dose of IVIG started per order. Pt educated on risks/benefits; pt verbalizes understanding and has no further questions at this time. Discussed dose titration and vital sign checks with RN; RN has no questions at this time and may consult [facility] for any related needs.

## 2020-07-31 NOTE — Progress Notes (Signed)
RT NOTES: NIF: -30,  VC: 2.65L. Patient with good effort.

## 2020-07-31 NOTE — Progress Notes (Addendum)
[facility] PROGRESS NOTE    Justine NullJeffery Latour  WUJ:[id number] DOB: Jan 27, 1981 DOA: 07/28/2020 PCP: Patient, No Pcp Per      Brief Narrative:  Mr. Denman GeorgeGoff is a 39 y.o. 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  Over 7-10 days prior to admission, patient first noticed paresthesias, allodynia and weakness in bilateral lower extremities, which progressed to the point that the patient was falling.    In the ER, CT head normal, started on Rocephic and vancomycin and LP performed.        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 MRI C/T/L spine unremarkable. LP showed acellular CSF, culture negative.  Crypto antigen negative.  VDRL negative. Quant gold negative.    NIFs -30 to -40 normal  -Consult Neurology -Continue IVIG -Follow EBV -Serial NIF   Cytopenia ruled out Macrocytic anemia Admitted with normal WBC, platelets, new macrocytic anemia. Last CBC showed Hgb 7.6, but repeat H/H higher, wonder if this was spurious.  CBC shows mild macrocytic anemia, minimal leukopenia, normal platelets.  Macrocytic anemia can certainly be just from [place].  Worsening anemia from dilution versus lysis -Check LDH, haptoglobin, LFTs -Check B12, folate, TSH, smear  HIV -Follow HIV quant -Continue Biktarvy -Continue Bactrim PPx  Lactic acidosis Not due to sepsis  Hyponatremia Asymptomatic -Trend Na  Hypokalemia -Repeat K          Disposition: Status is: Inpatient  Remains inpatient appropriate because:IV treatments appropriate due to intensity of illness or inability to take PO   Dispo:  Patient From: Home  Planned Disposition: Home  Expected discharge date: 08/05/20  Medically stable for discharge: No               MDM: The below labs and imaging reports were reviewed and summarized above.  Medication management as  above.   DVT prophylaxis: enoxaparin (LOVENOX) injection 40 mg Start: 07/30/20 2200 SCDs Start: 07/28/20 2142  Code Status: Full code Family Communication:     Consultants:   Infectious disease  Neurology  Procedures:   IVIG started 10/4           Subjective: Patient feels well, he still has paresthesias up to the bellybutton, allodynia on both legs, worse in the lower extremities, and weakness bilaterally.  Otherwise he has no fever, headache, upper extremity symptoms, dyspnea, cough, malaise.  Objective: Vitals:   07/31/20 0334 07/31/20 0528 07/31/20 0901 07/31/20 1433  BP: 108/73 105/69    Pulse: 81 84    Resp: (!) 23 20    Temp: 98.3 F (36.8 C) 98.5 F (36.9 C) 98 F (36.7 C) 98.6 F (37 C)  TempSrc: Oral Oral Oral Oral  SpO2: 97% 97%    Weight:      Height:        Intake/Output Summary (Last 24 hours) at 07/31/2020 1830 Last data filed at 07/31/2020 1531 Gross per 24 hour  Intake 960 ml  Output 1250 ml  Net -290 ml   Filed Weights   07/28/20 1352  Weight: 65.8 kg    Examination: General appearance:  adult male, alert and in no obvious distress.  Lying in bed, interactive. HEENT: Anicteric, conjunctiva pink, lids and lashes normal. No nasal deformity, discharge, epistaxis.  Lips moist, dentition in normal repair, oropharynx moist, small patch of whitish plaque in the posterior pharynx, hearing normal.   Skin: Warm and dry.  No jaundice.  No suspicious rashes or lesions. Cardiac: RRR, nl S1-S2, no murmurs appreciated.  Capillary refill is brisk.  JVP normal.  No LE edema.  Radial pulses 2+ and symmetric. Respiratory: Normal respiratory rate and rhythm.  CTAB without rales or wheezes. Abdomen: Abdomen soft.  No TTP or guarding. No ascites, distension, hepatosplenomegaly.   MSK: No deformities or effusions. Neuro: Awake and alert.  EOMI, moves upper extremities with normal strength and coordination.  Lower extremity strength bilaterally 4/5,  symmetric.  He has allodynia in the feet, lack of temperature sensation up to bellybutton. Speech fluent.    Psych: Sensorium intact and responding to questions, attention normal. Affect normal.  Judgment and insight appear normal.    Data Reviewed: I have personally reviewed following labs and imaging studies:  CBC: Recent Labs  Lab 07/28/20 1303 07/29/20 0419 07/30/20 0623 07/30/20 1055 07/31/20 1454  WBC 6.9 4.7 3.3*  --  3.9*  NEUTROABS 4.6  --  1.2*  --   --   HGB 11.5* 9.6* 7.6* 10.2* 10.0*  HCT 33.4* 28.3* 22.3* 29.9* 30.2*  MCV 102.5* 102.9* 106.2*  --  104.1*  PLT 195 155 128*  --  202   Basic Metabolic Panel: Recent Labs  Lab 07/28/20 1303 07/28/20 1314 07/29/20 0419 07/30/20 0623 07/31/20 1454  NA 130*  --  132* 135  --   K 3.8  --  3.3* 3.7  --   CL 95*  --  101 109  --   CO2 23  --  22 19*  --   GLUCOSE 73  --  99 84  --   BUN 7  --  <5* <5*  --   CREATININE 0.64  --  0.63 0.51*  --   CALCIUM 7.9*  --  7.8* 7.6*  --   MG  --  1.8  --  1.6* 1.6*  PHOS  --   --   --  3.5  --    GFR: Estimated Creatinine Clearance: 114.2 mL/min (A) (by C-G formula based on SCr of 0.51 mg/dL (L)). Liver Function Tests: Recent Labs  Lab 07/28/20 1303 07/28/20 2128 07/29/20 0419 07/30/20 0623 07/31/20 1454  AST 98*  --  78* 62* 59*  ALT 41  --  38 38 47*  ALKPHOS 190*  --  139* 114 125  BILITOT 2.4*  --  0.9 0.5 0.4  PROT 8.2*  --  6.7 6.1* 7.4  ALBUMIN 2.4* 2.8* 1.9* 1.9* 1.9*   No results for input(s): LIPASE, AMYLASE in the last 168 hours. No results for input(s): AMMONIA in the last 168 hours. Coagulation Profile: No results for input(s): INR, PROTIME in the last 168 hours. Cardiac Enzymes: Recent Labs  Lab 07/28/20 1303  CKTOTAL 122   BNP (last 3 results) No results for input(s): PROBNP in the last 8760 hours. HbA1C: No results for input(s): HGBA1C in the last 72 hours. CBG: Recent Labs  Lab 07/31/20 1106  GLUCAP 105*   Lipid Profile: No  results for input(s): CHOL, HDL, LDLCALC, TRIG, CHOLHDL, LDLDIRECT in the last 72 hours. Thyroid Function Tests: Recent Labs    07/31/20 1454  TSH 3.347   Anemia Panel: Recent Labs    07/31/20 1454  VITAMINB12 439  FOLATE 5.6*  FERRITIN 631*   Urine analysis:    Component Value Date/Time   COLORURINE AMBER (A) 07/28/2020 1303   APPEARANCEUR CLEAR 07/28/2020 1303   LABSPEC 1.012 07/28/2020 1303   PHURINE 6.0 07/28/2020 1303   GLUCOSEU NEGATIVE  07/28/2020 1303   HGBUR NEGATIVE 07/28/2020 1303   BILIRUBINUR SMALL (A) 07/28/2020 1303   KETONESUR NEGATIVE 07/28/2020 1303   PROTEINUR NEGATIVE 07/28/2020 1303   NITRITE NEGATIVE 07/28/2020 1303   LEUKOCYTESUR TRACE (A) 07/28/2020 1303   Sepsis Labs: @LABRCNTIP (procalcitonin:4,lacticacidven:4)  ) Recent Results (from the past 240 hour(s))  Respiratory Panel by RT PCR (Flu A&B, Covid) - Nasopharyngeal Swab     Status: None   Collection Time: 07/28/20  1:05 PM   Specimen: Nasopharyngeal Swab  Result Value Ref Range Status   SARS Coronavirus 2 by RT PCR NEGATIVE NEGATIVE Corrected   Influenza A by PCR NEGATIVE NEGATIVE Final   Influenza B by PCR NEGATIVE NEGATIVE Final    Comment: (NOTE) The Xpert Xpress SARS-CoV-2/FLU/RSV assay is intended as an aid in  the diagnosis of influenza from Nasopharyngeal swab specimens and  should not be used as a sole basis for treatment. Nasal washings and  aspirates are unacceptable for Xpert Xpress SARS-CoV-2/FLU/RSV  testing.  Fact Sheet for Patients: 09/27/20  Fact Sheet for Healthcare Providers: https://www.moore.com/  This test is not yet approved or cleared by the https://www.young.biz/ FDA and  has been authorized for detection and/or diagnosis of SARS-CoV-2 by  FDA under an Emergency Use Authorization (EUA). This EUA will remain  in effect (meaning this test can be used) for the duration of the  Covid-19 declaration under Section  564(b)(1) of the Act, 21  U.S.C. section 360bbb-3(b)(1), unless the authorization is  terminated or revoked. Performed at [facility], [address], [place], [place]   Culture, blood (routine x 2)     Status: None (Preliminary result)   Collection Time: 07/28/20  3:24 PM   Specimen: BLOOD RIGHT HAND  Result Value Ref Range Status   Specimen Description   Final    BLOOD RIGHT HAND Performed at [facility], [address] [place], [place]    Special Requests   Final    BOTTLES DRAWN AEROBIC AND ANAEROBIC Blood Culture adequate volume Performed at [facility], [address] [place], [place]    Culture   Final    NO GROWTH 3 DAYS Performed at [facility], [address], [place], [place]    Report Status PENDING  Incomplete  CSF culture     Status: None (Preliminary result)   Collection Time: 07/28/20  8:19 PM   Specimen: CSF; Cerebrospinal Fluid  Result Value Ref Range Status   Specimen Description   Final    CSF Performed at [facility], [address] [place], [place]    Special Requests   Final    Immunocompromised Performed at [facility], [address] [place], [place]    Gram Stain   Final    WBC PRESENT, PREDOMINANTLY MONONUCLEAR NO ORGANISMS SEEN CYTOSPIN SMEAR    Culture   Final    NO GROWTH 3 DAYS Performed at [facility], [address], [place], [place]    Report Status PENDING  Incomplete         Radiology Studies: No results found.      Scheduled Meds: . bictegravir-emtricitabine-tenofovir AF  1 tablet Oral Daily  . enoxaparin (LOVENOX) injection  40 mg Subcutaneous Q24H  . feeding supplement (ENSURE ENLIVE)  237 mL Oral BID BM  . sulfamethoxazole-trimethoprim  1 tablet Oral Daily   Continuous Infusions: . Immune Globulin  10% (PRIVIGEN) 25 g Stopped  (07/31/20 0427)     LOS: 3 days    Time spent: 25 minutes    Alberteen Sam, MD [facility] 07/31/2020, 6:30 PM     Please page though AMION or Epic secure chat:  For [employer], Higher education careers adviser

## 2020-07-31 NOTE — Progress Notes (Signed)
[facility]   Reason for visit: Follow up on possible Guillan Barre  Interval History: weakness and sensitivity of lower extremities.  Many questions regarding the diagnosis and prognosis.   CD4 90 VL pending.   Physical Exam: Constitutional:  Vitals:   07/31/20 0528 07/31/20 0901  BP: 105/69   Pulse: 84   Resp: 20   Temp: 98.5 F (36.9 C) 98 F (36.7 C)  SpO2: 97%    patient appears in NAD Respiratory: Normal respiratory effort; CTA B Cardiovascular: RRR MS: sensitive to touch  Review of Systems: Constitutional: negative for fevers and chills Gastrointestinal: negative for diarrhea  Lab Results  Component Value Date   WBC 3.3 (L) 07/30/2020   HGB 10.2 (L) 07/30/2020   HCT 29.9 (L) 07/30/2020   MCV 106.2 (H) 07/30/2020   PLT 128 (L) 07/30/2020    Lab Results  Component Value Date   CREATININE 0.51 (L) 07/30/2020   BUN <5 (L) 07/30/2020   NA 135 07/30/2020   K 3.7 07/30/2020   CL 109 07/30/2020   CO2 19 (L) 07/30/2020    Lab Results  Component Value Date   ALT 38 07/30/2020   AST 62 (H) 07/30/2020   ALKPHOS 114 07/30/2020     Microbiology: Recent Results (from the past 240 hour(s))  Respiratory Panel by RT PCR (Flu A&B, Covid) - Nasopharyngeal Swab     Status: None   Collection Time: 07/28/20  1:05 PM   Specimen: Nasopharyngeal Swab  Result Value Ref Range Status   SARS Coronavirus 2 by RT PCR NEGATIVE NEGATIVE Corrected   Influenza A by PCR NEGATIVE NEGATIVE Final   Influenza B by PCR NEGATIVE NEGATIVE Final    Comment: (NOTE) The Xpert Xpress SARS-CoV-2/FLU/RSV assay is intended as an aid in  the diagnosis of influenza from Nasopharyngeal swab specimens and  should not be used as a sole basis for treatment. Nasal washings and  aspirates are unacceptable for Xpert Xpress SARS-CoV-2/FLU/RSV  testing.  Fact Sheet for Patients: https://www.moore.com/  Fact Sheet for Healthcare  Providers: https://www.young.biz/  This test is not yet approved or cleared by the [place] FDA and  has been authorized for detection and/or diagnosis of SARS-CoV-2 by  FDA under an Emergency Use Authorization (EUA). This EUA will remain  in effect (meaning this test can be used) for the duration of the  Covid-19 declaration under Section 564(b)(1) of the Act, 21  U.S.C. section 360bbb-3(b)(1), unless the authorization is  terminated or revoked. Performed at [facility], [address] [place], [place] [address]   Culture, blood (routine x 2)     Status: None (Preliminary result)   Collection Time: 07/28/20  3:24 PM   Specimen: BLOOD RIGHT HAND  Result Value Ref Range Status   Specimen Description   Final    BLOOD RIGHT HAND Performed at [facility], [address], [place], [place] [address]    Special Requests   Final    BOTTLES DRAWN AEROBIC AND ANAEROBIC Blood Culture adequate volume Performed at [facility], [address], [place], [place] [address]    Culture   Final    NO GROWTH 3 DAYS Performed at [facility], [address], [place], [place] [address]    Report Status PENDING  Incomplete  CSF culture     Status: None (Preliminary result)   Collection Time: 07/28/20  8:19 PM   Specimen: CSF; Cerebrospinal Fluid  Result Value Ref Range Status   Specimen Description   Final    CSF Performed at [facility], [address], [place], [place] [address]    Special Requests   Final    Immunocompromised Performed at [facility], [address], [place], [place] [address]    Gram Stain   Final    WBC PRESENT, PREDOMINANTLY MONONUCLEAR NO ORGANISMS SEEN CYTOSPIN SMEAR    Culture   Final    NO GROWTH 3 DAYS Performed at [facility], [address], [place], [place] [address]    Report Status PENDING  Incomplete     Impression/Plan:  1. Bilateral leg weakness - ? Guillan-Barre, other etiology.  Followed by neurology.  Received IVIg today.    2.HIV - poorly controlled.  Have restarted the Biktarvy.    3.  OI prophylaxis - on Bactrim for PJP prophylaxis.

## 2020-08-01 ENCOUNTER — Telehealth: Payer: Self-pay

## 2020-08-01 DIAGNOSIS — G610 Guillain-Barre syndrome: Secondary | ICD-10-CM

## 2020-08-01 DIAGNOSIS — Z8619 Personal history of other infectious and parasitic diseases: Secondary | ICD-10-CM | POA: Diagnosis not present

## 2020-08-01 DIAGNOSIS — B20 Human immunodeficiency virus [HIV] disease: Secondary | ICD-10-CM | POA: Diagnosis not present

## 2020-08-01 DIAGNOSIS — E872 Acidosis: Secondary | ICD-10-CM | POA: Diagnosis not present

## 2020-08-01 DIAGNOSIS — R29898 Other symptoms and signs involving the musculoskeletal system: Secondary | ICD-10-CM | POA: Diagnosis not present

## 2020-08-01 LAB — CBC
HCT: 29.7 % — ABNORMAL LOW (ref 39.0–52.0)
Hemoglobin: 9.6 g/dL — ABNORMAL LOW (ref 13.0–17.0)
MCH: 34.5 pg — ABNORMAL HIGH (ref 26.0–34.0)
MCHC: 32.3 g/dL (ref 30.0–36.0)
MCV: 106.8 fL — ABNORMAL HIGH (ref 80.0–100.0)
Platelets: 213 10*3/uL (ref 150–400)
RBC: 2.78 MIL/uL — ABNORMAL LOW (ref 4.22–5.81)
RDW: 15.8 % — ABNORMAL HIGH (ref 11.5–15.5)
WBC: 4 10*3/uL (ref 4.0–10.5)
nRBC: 0 % (ref 0.0–0.2)

## 2020-08-01 LAB — BASIC METABOLIC PANEL
Anion gap: 6 (ref 5–15)
BUN: <5 mg/dL — ABNORMAL LOW (ref 6–20)
CO2: 26 mmol/L (ref 22–32)
Calcium: 8.6 mg/dL — ABNORMAL LOW (ref 8.9–10.3)
Chloride: 101 mmol/L (ref 98–111)
Creatinine, Ser: 0.7 mg/dL (ref 0.61–1.24)
GFR calc non Af Amer: >60 mL/min (ref >60)
Glucose, Bld: 81 mg/dL (ref 70–99)
Potassium: 4.5 mmol/L (ref 3.5–5.1)
Sodium: 133 mmol/L — ABNORMAL LOW (ref 135–145)

## 2020-08-01 LAB — HIV-1 RNA QUANT-NO REFLEX-BLD
HIV 1 RNA Quant: 3310 copies/mL
LOG10 HIV-1 RNA: 3.52 log10copy/mL

## 2020-08-01 LAB — MAGNESIUM: Magnesium: 1.7 mg/dL (ref 1.7–2.4)

## 2020-08-01 LAB — CSF CULTURE W GRAM STAIN: Culture: NO GROWTH

## 2020-08-01 LAB — ROCKY MTN SPOTTED FVR ABS PNL(IGG+IGM)
RMSF IgG: NEGATIVE
RMSF IgM: 0.81 index (ref 0.00–0.89)

## 2020-08-01 LAB — OLIGOCLONAL BANDS, CSF + SERM

## 2020-08-01 LAB — HAPTOGLOBIN: Haptoglobin: 183 mg/dL (ref 17–317)

## 2020-08-01 MED ORDER — MAGNESIUM SULFATE 2 GM/50ML IV SOLN
2.0000 g | Freq: Once | INTRAVENOUS | Status: AC
  Administered 2020-08-01: 2 g via INTRAVENOUS
  Filled 2020-08-01: qty 50

## 2020-08-01 MED ORDER — GABAPENTIN 600 MG PO TABS
300.0000 mg | ORAL_TABLET | Freq: Three times a day (TID) | ORAL | Status: DC
  Administered 2020-08-01 – 2020-08-02 (×3): 300 mg via ORAL
  Filled 2020-08-01 (×3): qty 1

## 2020-08-01 MED FILL — Immune Globulin (Human) IV Soln 5 GM/50ML: INTRAVENOUS | Qty: 50 | Status: AC

## 2020-08-01 MED FILL — Immune Globulin (Human) IV Soln 20 GM/200ML: INTRAVENOUS | Qty: 200 | Status: AC

## 2020-08-01 NOTE — Progress Notes (Signed)
Pt requesting bed alarm off he is here from a fall from home. Pt educated on safety of bed alarm and still requested for it to be turned off. I told the pt that the door had to be open if no bed alarm was on and that we didnt want him to get up on his own. Pt agreed. Will continue to monitor  Everlean Cherry, RN

## 2020-08-01 NOTE — Progress Notes (Signed)
PROGRESS NOTE    Travis Mcgee  OVF:[id number] DOB: 31-Oct-1980 DOA: 07/28/2020 PCP: Patient, No Pcp Per    Brief Narrative:  Patient admitted to the hospital with working diagnosis of [name] syndrome.   39 year old male with significant past medical history for HIV/AIDS and latent syphilis who presented with bilateral extremity pain.  Positive pain for the last 7 to 10 days, associated with paresthesias and weakness.  Progressive symptoms with multiple falls.  On his initial physical examination blood pressure 118/86, heart rate 110, respiratory rate 36, oxygen saturation 100%.  His lungs are clear to auscultation, heart S1-S2, present rhythmic, soft abdomen, lower extremity edema.  His strength was preserved upper extremities, he had a slow gait.  Patient underwent further work-up with CT head of brain MRI which were negative.  MRI of the cervical thoracic lumbar spine was unremarkable.  Lumbar puncture showed negative culture, negative cryptococcus antigen and VDRL negative.  He had elevated CSF IgG/albumin ratio consistent with intrathecal antibiotic production.  Neurology was consulted with recommendations to administer IVIG for 5 days.   Assessment & Plan:   Principal Problem:   Guillain Barr syndrome (HCC) Active Problems:   AIDS (acquired immune deficiency syndrome) (HCC)   History of syphilis   Lactic acidosis   1. Guillain Barre syndrome. Patient continue to have weakness at the lower extremities, now with pain, paresthesias and burning sensation.   Continue with IV IG per protocol, continue neuro checks and PT/OT evaluation. Add gabapentin for neuropathic pain.  Follow with further recommendations from neurology   2. HIV/ AIDS. Continue with antiretroviral therapy. Prophylaxis with bactrim. Consult nutrition for recommendations.   3. Chronic disease anemia. Hgb stable at 10,0 with Hct at 30,2/   4. Hyponatremia, hypomagenesemia and hypokalemia. Renal function  stable with serum cr at 0,70, K at 4,5 and Na at 133, ca 8,6 and Mg at 1,7.   Continue Mg correction with Mag sulfate. Continue close follow up on electrolytes.    Patient continue to be at high risk for worsening neuropathy   Status is: Inpatient  Remains inpatient appropriate because:IV treatments appropriate due to intensity of illness or inability to take PO   Dispo:  Patient From: Home  Planned Disposition: Home  Expected discharge date: 08/05/20  Medically stable for discharge: No    DVT prophylaxis: Enoxaparin   Code Status:   Full   Family Communication:  No family at the bedside       Consultants:   Neurology   ID     Subjective: Patient continue to be very weak and deconditioned, complains of pain at the lower extremities, burning in nature and severe in intensity. Positive paresthesias.   Objective: Vitals:   08/01/20 0156 08/01/20 0217 08/01/20 0423 08/01/20 0824  BP: 113/68 105/67 110/70 120/83  Pulse:    79  Resp: 18 15 16  (!) 21  Temp: 98.7 F (37.1 C) 98 F (36.7 C) 97.7 F (36.5 C) 97.9 F (36.6 C)  TempSrc: Oral Oral Oral Oral  SpO2: 100% 100% 97% 96%  Weight:      Height:        Intake/Output Summary (Last 24 hours) at 08/01/2020 1522 Last data filed at 08/01/2020 0825 Gross per 24 hour  Intake 500 ml  Output 1130 ml  Net -630 ml   Filed Weights   07/28/20 1352  Weight: 65.8 kg    Examination:   General: Not in pain or dyspnea., deconditioned  Neurology: Awake and alert, decreased strength  lower extremities, 3-4/5 bilateral.  E ENT: no pallor, no icterus, oral mucosa moist Cardiovascular: No JVD. S1-S2 present, rhythmic, no gallops, rubs, or murmurs. No lower extremity edema. Pulmonary: positive breath sounds bilaterally, adequate air movement, no wheezing, rhonchi or rales. Gastrointestinal. Abdomen soft and non tender Skin. No rashes Musculoskeletal: no joint deformities/ tender to palpation lower extremities     Data  Reviewed: I have personally reviewed following labs and imaging studies  CBC: Recent Labs  Lab 07/28/20 1303 07/28/20 1303 07/29/20 0419 07/30/20 0623 07/30/20 1055 07/31/20 1454 08/01/20 0440  WBC 6.9  --  4.7 3.3*  --  3.9* 4.0  NEUTROABS 4.6  --   --  1.2*  --   --   --   HGB 11.5*   < > 9.6* 7.6* 10.2* 10.0* 9.6*  HCT 33.4*   < > 28.3* 22.3* 29.9* 30.2* 29.7*  MCV 102.5*  --  102.9* 106.2*  --  104.1* 106.8*  PLT 195  --  155 128*  --  202 213   < > = values in this interval not displayed.   Basic Metabolic Panel: Recent Labs  Lab 07/28/20 1303 07/28/20 1314 07/29/20 0419 07/30/20 0623 07/31/20 1454 08/01/20 0440 08/01/20 1230  NA 130*  --  132* 135  --  133*  --   K 3.8  --  3.3* 3.7  --  4.5  --   CL 95*  --  101 109  --  101  --   CO2 23  --  22 19*  --  26  --   GLUCOSE 73  --  99 84  --  81  --   BUN 7  --  <5* <5*  --  <5*  --   CREATININE 0.64  --  0.63 0.51*  --  0.70  --   CALCIUM 7.9*  --  7.8* 7.6*  --  8.6*  --   MG  --  1.8  --  1.6* 1.6*  --  1.7  PHOS  --   --   --  3.5  --   --   --    GFR: Estimated Creatinine Clearance: 114.2 mL/min (by C-G formula based on SCr of 0.7 mg/dL). Liver Function Tests: Recent Labs  Lab 07/28/20 1303 07/28/20 2128 07/29/20 0419 07/30/20 0623 07/31/20 1454  AST 98*  --  78* 62* 59*  ALT 41  --  38 38 47*  ALKPHOS 190*  --  139* 114 125  BILITOT 2.4*  --  0.9 0.5 0.4  PROT 8.2*  --  6.7 6.1* 7.4  ALBUMIN 2.4* 2.8* 1.9* 1.9* 1.9*   No results for input(s): LIPASE, AMYLASE in the last 168 hours. No results for input(s): AMMONIA in the last 168 hours. Coagulation Profile: No results for input(s): INR, PROTIME in the last 168 hours. Cardiac Enzymes: Recent Labs  Lab 07/28/20 1303  CKTOTAL 122   BNP (last 3 results) No results for input(s): PROBNP in the last 8760 hours. HbA1C: No results for input(s): HGBA1C in the last 72 hours. CBG: Recent Labs  Lab 07/31/20 1106  GLUCAP 105*   Lipid  Profile: No results for input(s): CHOL, HDL, LDLCALC, TRIG, CHOLHDL, LDLDIRECT in the last 72 hours. Thyroid Function Tests: Recent Labs    07/31/20 1454  TSH 3.347   Anemia Panel: Recent Labs    07/31/20 1454  VITAMINB12 439  FOLATE 5.6*  FERRITIN 631*      Radiology Studies: I have  reviewed all of the imaging during this hospital visit personally     Scheduled Meds: . bictegravir-emtricitabine-tenofovir AF  1 tablet Oral Daily  . enoxaparin (LOVENOX) injection  40 mg Subcutaneous Q24H  . feeding supplement (ENSURE ENLIVE)  237 mL Oral BID BM  . pregabalin  75 mg Oral BID  . sulfamethoxazole-trimethoprim  1 tablet Oral Daily   Continuous Infusions: . Immune Globulin 10% (PRIVIGEN) 25 g Stopped (08/01/20 0208)     LOS: 4 days        Coni Homesley Annett Gula, MD

## 2020-08-01 NOTE — Progress Notes (Signed)
[facility]   Reason for visit: Follow up on HIV, leg weakness  Interval History: afebrile, WBC wnl.  On Biktarvy and tolerating.  CD4 of 90 and viral load 23,000.  No changes in his legs.    Physical Exam: Constitutional:  Vitals:   08/01/20 0423 08/01/20 0824  BP: 110/70 120/83  Pulse:  79  Resp: 16 (!) 21  Temp: 97.7 F (36.5 C) 97.9 F (36.6 C)  SpO2: 97% 96%   patient appears in NAD Respiratory: Normal respiratory effort; CTA B Cardiovascular: RRR GI: soft, nt, nd MS: legs with weakness, sensitivity to touch  Review of Systems: Constitutional: negative for fevers and chills Gastrointestinal: negative for nausea and diarrhea  Lab Results  Component Value Date   WBC 4.0 08/01/2020   HGB 9.6 (L) 08/01/2020   HCT 29.7 (L) 08/01/2020   MCV 106.8 (H) 08/01/2020   PLT 213 08/01/2020    Lab Results  Component Value Date   CREATININE 0.70 08/01/2020   BUN <5 (L) 08/01/2020   NA 133 (L) 08/01/2020   K 4.5 08/01/2020   CL 101 08/01/2020   CO2 26 08/01/2020    Lab Results  Component Value Date   ALT 47 (H) 07/31/2020   AST 59 (H) 07/31/2020   ALKPHOS 125 07/31/2020     Microbiology: Recent Results (from the past 240 hour(s))  Respiratory Panel by RT PCR (Flu A&B, Covid) - Nasopharyngeal Swab     Status: None   Collection Time: 07/28/20  1:05 PM   Specimen: Nasopharyngeal Swab  Result Value Ref Range Status   SARS Coronavirus 2 by RT PCR NEGATIVE NEGATIVE Corrected   Influenza A by PCR NEGATIVE NEGATIVE Final   Influenza B by PCR NEGATIVE NEGATIVE Final    Comment: (NOTE) The Xpert Xpress SARS-CoV-2/FLU/RSV assay is intended as an aid in  the diagnosis of influenza from Nasopharyngeal swab specimens and  should not be used as a sole basis for treatment. Nasal washings and  aspirates are unacceptable for Xpert Xpress SARS-CoV-2/FLU/RSV  testing.  Fact Sheet for Patients: https://www.moore.com/  Fact Sheet  for Healthcare Providers: https://www.young.biz/  This test is not yet approved or cleared by the [place] FDA and  has been authorized for detection and/or diagnosis of SARS-CoV-2 by  FDA under an Emergency Use Authorization (EUA). This EUA will remain  in effect (meaning this test can be used) for the duration of the  Covid-19 declaration under Section 564(b)(1) of the Act, 21  U.S.C. section 360bbb-3(b)(1), unless the authorization is  terminated or revoked. Performed at [facility], [address], [place], [place] [address]   Culture, blood (routine x 2)     Status: None (Preliminary result)   Collection Time: 07/28/20  3:24 PM   Specimen: BLOOD RIGHT HAND  Result Value Ref Range Status   Specimen Description   Final    BLOOD RIGHT HAND Performed at [facility], [address], [place], [place] [address]    Special Requests   Final    BOTTLES DRAWN AEROBIC AND ANAEROBIC Blood Culture adequate volume Performed at [facility], [address] [place], [place] [address]    Culture   Final    NO GROWTH 4 DAYS Performed at [facility], [address], [place], [place] [address]    Report Status PENDING  Incomplete  CSF culture     Status: None   Collection Time: 07/28/20  8:19 PM   Specimen: CSF; Cerebrospinal Fluid  Result Value Ref Range Status   Specimen Description   Final    CSF Performed at [facility], [address], [place], [place] [address]    Special Requests   Final    Immunocompromised Performed at [facility], [address], [place], [place] [address]    Gram Stain   Final    WBC PRESENT, PREDOMINANTLY MONONUCLEAR NO ORGANISMS SEEN CYTOSPIN SMEAR    Culture   Final    NO GROWTH Performed at [facility], [address], [place], [place] [address]    Report Status 08/01/2020 FINAL  Final  Fungus Culture With Stain      Status: None (Preliminary result)   Collection Time: 07/28/20  8:19 PM   Specimen: Lumbar Puncture; Cerebrospinal Fluid  Result Value Ref Range Status   Fungus Stain Final report  Final    Comment: (NOTE) Performed At: [facility] [address] [place], [place] [id number] Jolene Schimke MD [id number]    Fungus (Mycology) Culture PENDING  Incomplete   Fungal Source CSF  Final    Comment: Performed at [facility], [address], [place], [place] [address]  Fungus Culture Result     Status: None   Collection Time: 07/28/20  8:19 PM  Result Value Ref Range Status   Result 1 Comment  Final    Comment: (NOTE) KOH/Calcofluor preparation:  no fungus observed. Performed At: [facility] [address] [place], [place] [id number] Jolene Schimke MD [id number]     Impression/Plan:  1. HIV - CD4 and viral load c/w poor compliance.  Now have restarted Biktarvy here.    2.  Possible Guillain Barre - Received IVIg yesterday and monitoring for improvement.  Followed by neurology.   CSF with negative VDRL.    3.  Neuropathy - ? If he would benefit from Neurontin for the leg pain.  I will defer to neurology.  I will continue to follow intermittently

## 2020-08-01 NOTE — Therapy (Signed)
Bedside procedure NIF: -40, VC: 2.73

## 2020-08-01 NOTE — Telephone Encounter (Signed)
Attempted to contact patient to inform him we'd be cancelling his labs and follow up due to hospitalization. Unable to leave voice mail at this time. Patient will need to call and reschedule labs and follow up appointment after discharge.  Crissie Figures

## 2020-08-02 DIAGNOSIS — G610 Guillain-Barre syndrome: Principal | ICD-10-CM

## 2020-08-02 DIAGNOSIS — E872 Acidosis: Secondary | ICD-10-CM | POA: Diagnosis not present

## 2020-08-02 DIAGNOSIS — B20 Human immunodeficiency virus [HIV] disease: Secondary | ICD-10-CM | POA: Diagnosis not present

## 2020-08-02 DIAGNOSIS — Z8619 Personal history of other infectious and parasitic diseases: Secondary | ICD-10-CM | POA: Diagnosis not present

## 2020-08-02 LAB — LYME, WESTERN BLOT, SERUM (REFLEXED)
IgG P18 Ab.: ABSENT
IgG P23 Ab.: ABSENT
IgG P28 Ab.: ABSENT
IgG P30 Ab.: ABSENT
IgG P39 Ab.: ABSENT
IgG P45 Ab.: ABSENT
IgG P58 Ab.: ABSENT
IgG P66 Ab.: ABSENT
IgG P93 Ab.: ABSENT
IgM P23 Ab.: ABSENT
IgM P39 Ab.: ABSENT
IgM P41 Ab.: ABSENT
Lyme IgG Wb: NEGATIVE
Lyme IgM Wb: NEGATIVE

## 2020-08-02 LAB — CULTURE, BLOOD (ROUTINE X 2)
Culture: NO GROWTH
Special Requests: ADEQUATE

## 2020-08-02 LAB — BASIC METABOLIC PANEL
Anion gap: 5 (ref 5–15)
BUN: 5 mg/dL — ABNORMAL LOW (ref 6–20)
CO2: 23 mmol/L (ref 22–32)
Calcium: 8.5 mg/dL — ABNORMAL LOW (ref 8.9–10.3)
Chloride: 104 mmol/L (ref 98–111)
Creatinine, Ser: 0.64 mg/dL (ref 0.61–1.24)
GFR calc non Af Amer: >60 mL/min (ref >60)
Glucose, Bld: 102 mg/dL — ABNORMAL HIGH (ref 70–99)
Potassium: 4.7 mmol/L (ref 3.5–5.1)
Sodium: 132 mmol/L — ABNORMAL LOW (ref 135–145)

## 2020-08-02 LAB — HSV 1/2 AB IGG/IGM CSF
HSV 1/2 Ab Screen IgG, CSF: 0.69 IV (ref <=0.89)
HSV 1/2 Ab, IgM, CSF: 0.12 IV (ref <=0.89)

## 2020-08-02 LAB — EPSTEIN BARR VRS(EBV DNA BY PCR)
EBV DNA QN by PCR: NEGATIVE copies/mL
log10 EBV DNA Qn PCR: UNDETERMINED log10 copy/mL

## 2020-08-02 LAB — CMV DNA BY PCR, QUALITATIVE: CMV DNA, Qual PCR: NEGATIVE

## 2020-08-02 LAB — MAGNESIUM: Magnesium: 1.9 mg/dL (ref 1.7–2.4)

## 2020-08-02 LAB — B. BURGDORFI ANTIBODIES: B burgdorferi Ab IgG+IgM: 1.43 {ISR} — ABNORMAL HIGH (ref 0.00–0.90)

## 2020-08-02 MED ORDER — ADULT MULTIVITAMIN W/MINERALS CH
1.0000 | ORAL_TABLET | Freq: Every day | ORAL | Status: DC
  Administered 2020-08-02 – 2020-08-18 (×17): 1 via ORAL
  Filled 2020-08-02 (×17): qty 1

## 2020-08-02 MED ORDER — GABAPENTIN 300 MG PO CAPS
600.0000 mg | ORAL_CAPSULE | Freq: Three times a day (TID) | ORAL | Status: DC
  Administered 2020-08-02 – 2020-08-05 (×9): 600 mg via ORAL
  Filled 2020-08-02 (×9): qty 2

## 2020-08-02 MED ORDER — ENSURE ENLIVE PO LIQD
237.0000 mL | Freq: Three times a day (TID) | ORAL | Status: DC
  Administered 2020-08-04 – 2020-08-07 (×8): 237 mL via ORAL

## 2020-08-02 MED FILL — Immune Globulin (Human) IV Soln 20 GM/200ML: INTRAVENOUS | Qty: 250 | Status: AC

## 2020-08-02 NOTE — Progress Notes (Signed)
PROGRESS NOTE    Travis Mcgee  GYI:[id number] DOB: 09-04-1981 DOA: 07/28/2020 PCP: Patient, No Pcp Per    Brief Narrative:  Patient admitted to the hospital with working diagnosis of [name] syndrome.   39 year old male with significant past medical history for HIV/AIDS and latent syphilis who presented with bilateral extremity pain.  Positive pain for the last 7 to 10 days, associated with paresthesias and weakness.  Progressive symptoms with multiple falls.  On his initial physical examination blood pressure 118/86, heart rate 110, respiratory rate 36, oxygen saturation 100%.  His lungs are clear to auscultation, heart S1-S2, present rhythmic, soft abdomen, lower extremity edema.  His strength was preserved upper extremities, he had a slow gait.  Patient underwent further work-up with CT head of brain MRI which were negative.  MRI of the cervical thoracic lumbar spine was unremarkable.  Lumbar puncture showed negative culture, negative cryptococcus antigen and VDRL negative.  He had elevated CSF IgG/albumin ratio consistent with intrathecal antibiotic production.  Neurology was consulted with recommendations to administer IVIG for 5 days.    Assessment & Plan:   Principal Problem:   Guillain Barr syndrome (HCC) Active Problems:   AIDS (acquired immune deficiency syndrome) (HCC)   History of syphilis   Lactic acidosis   1. Guillain Barre syndrome. Reports persistent weakness at the lower extremities, and paresthesias and burning sensation. On IV IG #3/5  On neuro checks, PT/OT evaluation. Increase gabapentin to 600 mg tid and follow response. Pending final recommendations for disposition, CIR, DNF or home health services. Patient prefers inpatient rehab.   2. HIV/ AIDS. On antiretroviral therapy. Continue with prophylactic bactrim. Continue with  Nutritional supplements.   3. Chronic disease anemia. Stable cell count.    4. Hyponatremia, hypomagenesemia and  hypokalemia. Mg up to 1,9 with K at 4,7 and serum bicarbonate at 23. Stable renal function with cr at 0,64. NA continue to be low at 132.   Follow up on renal function in am.     Patient continue to be at high risk for worsening neurologic deficit.   Status is: Inpatient  Remains inpatient appropriate because:IV treatments appropriate due to intensity of illness or inability to take PO   Dispo:  Patient From: Home  Planned Disposition: Home  Expected discharge date: 08/05/20  Medically stable for discharge: No        DVT prophylaxis: Enoxaparin (declined)   Code Status:   full  Family Communication:  No family at the bedside      Nutrition Status: Nutrition Problem: Increased nutrient needs Etiology: acute illness Signs/Symptoms: estimated needs Interventions: Ensure Enlive (each supplement provides 350kcal and 20 grams of protein), MVI     Skin Documentation:     Consultants:   Neurology     Subjective: Patient continue to have lower extremity weakness and difficulty ambulating, not yet back to baseline, no nausea or vomiting, continue to have painful paresthesias at the lower extremities.   Objective: Vitals:   08/02/20 0110 08/02/20 0650 08/02/20 0820 08/02/20 1326  BP: 106/70 94/64 113/74 97/66  Pulse:   93 (!) 103  Resp: 20 20 20 20   Temp: 98.1 F (36.7 C) 98.1 F (36.7 C) 97.7 F (36.5 C) 98.9 F (37.2 C)  TempSrc: Oral  Oral Oral  SpO2: 97% 98% 96% 100%  Weight:      Height:        Intake/Output Summary (Last 24 hours) at 08/02/2020 1359 Last data filed at 08/02/2020 1018 Gross per 24  hour  Intake 250 ml  Output 2300 ml  Net -2050 ml   Filed Weights   07/28/20 1352  Weight: 65.8 kg    Examination:   General: Not in pain or dyspnea, deconditioned  Neurology: Awake and alert, non focal  E ENT: no pallor, no icterus, oral mucosa moist Cardiovascular: No JVD. S1-S2 present, rhythmic, no gallops, rubs, or murmurs. No lower  extremity edema. Pulmonary: positive breath sounds bilaterally, adequate air movement, no wheezing, rhonchi or rales. Gastrointestinal. Abdomen soft and non tender Skin. No rashes Musculoskeletal: no joint deformities     Data Reviewed: I have personally reviewed following labs and imaging studies  CBC: Recent Labs  Lab 07/28/20 1303 07/28/20 1303 07/29/20 0419 07/30/20 0623 07/30/20 1055 07/31/20 1454 08/01/20 0440  WBC 6.9  --  4.7 3.3*  --  3.9* 4.0  NEUTROABS 4.6  --   --  1.2*  --   --   --   HGB 11.5*   < > 9.6* 7.6* 10.2* 10.0* 9.6*  HCT 33.4*   < > 28.3* 22.3* 29.9* 30.2* 29.7*  MCV 102.5*  --  102.9* 106.2*  --  104.1* 106.8*  PLT 195  --  155 128*  --  202 213   < > = values in this interval not displayed.   Basic Metabolic Panel: Recent Labs  Lab 07/28/20 1303 07/28/20 1314 07/29/20 0419 07/30/20 0623 07/31/20 1454 08/01/20 0440 08/01/20 1230 08/02/20 0207  NA 130*  --  132* 135  --  133*  --  132*  K 3.8  --  3.3* 3.7  --  4.5  --  4.7  CL 95*  --  101 109  --  101  --  104  CO2 23  --  22 19*  --  26  --  23  GLUCOSE 73  --  99 84  --  81  --  102*  BUN 7  --  <5* <5*  --  <5*  --  5*  CREATININE 0.64  --  0.63 0.51*  --  0.70  --  0.64  CALCIUM 7.9*  --  7.8* 7.6*  --  8.6*  --  8.5*  MG  --  1.8  --  1.6* 1.6*  --  1.7 1.9  PHOS  --   --   --  3.5  --   --   --   --    GFR: Estimated Creatinine Clearance: 114.2 mL/min (by C-G formula based on SCr of 0.64 mg/dL). Liver Function Tests: Recent Labs  Lab 07/28/20 1303 07/28/20 2128 07/29/20 0419 07/30/20 0623 07/31/20 1454  AST 98*  --  78* 62* 59*  ALT 41  --  38 38 47*  ALKPHOS 190*  --  139* 114 125  BILITOT 2.4*  --  0.9 0.5 0.4  PROT 8.2*  --  6.7 6.1* 7.4  ALBUMIN 2.4* 2.8* 1.9* 1.9* 1.9*   No results for input(s): LIPASE, AMYLASE in the last 168 hours. No results for input(s): AMMONIA in the last 168 hours. Coagulation Profile: No results for input(s): INR, PROTIME in the last  168 hours. Cardiac Enzymes: Recent Labs  Lab 07/28/20 1303  CKTOTAL 122   BNP (last 3 results) No results for input(s): PROBNP in the last 8760 hours. HbA1C: No results for input(s): HGBA1C in the last 72 hours. CBG: Recent Labs  Lab 07/31/20 1106  GLUCAP 105*   Lipid Profile: No results for input(s): CHOL, HDL, LDLCALC,  TRIG, CHOLHDL, LDLDIRECT in the last 72 hours. Thyroid Function Tests: Recent Labs    07/31/20 1454  TSH 3.347   Anemia Panel: Recent Labs    07/31/20 1454  VITAMINB12 439  FOLATE 5.6*  FERRITIN 631*      Radiology Studies: I have reviewed all of the imaging during this hospital visit personally     Scheduled Meds: . bictegravir-emtricitabine-tenofovir AF  1 tablet Oral Daily  . enoxaparin (LOVENOX) injection  40 mg Subcutaneous Q24H  . feeding supplement (ENSURE ENLIVE)  237 mL Oral TID BM  . gabapentin  300 mg Oral TID  . multivitamin with minerals  1 tablet Oral Daily  . sulfamethoxazole-trimethoprim  1 tablet Oral Daily   Continuous Infusions: . Immune Globulin 10% (PRIVIGEN) 25 g Stopped (08/02/20 0100)     LOS: 5 days        Talissa Apple Annett Gula, MD

## 2020-08-02 NOTE — Progress Notes (Signed)
NIF -40 FVC 2.7

## 2020-08-02 NOTE — Progress Notes (Signed)
Initial Nutrition Assessment  DOCUMENTATION CODES:   Not applicable  INTERVENTION:    Ensure Enlive po TID, each supplement provides 350 kcal and 20 grams of protein  MVI daily   NUTRITION DIAGNOSIS:   Increased nutrient needs related to acute illness as evidenced by estimated needs.  GOAL:   Patient will meet greater than or equal to 90% of their needs  MONITOR:   PO intake, Supplement acceptance, Weight trends, Labs, I & O's  REASON FOR ASSESSMENT:   Consult Assessment of nutrition requirement/status  ASSESSMENT:   Patient with PMH significant for HIV/AIDS on HAART and latent syphilis. 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   On IVIG for 5 days.   Unable to reach pt. Meal completions charted as 100% for his last two meals. Increase Ensure Enlive to maximize kcal and protein. Will attempt to obtain nutrition history if possible.   Weight show to be stable over the last year. Suspect pt could be malnourished, will need to perform NFPE to make official diagnosis.   Medications: reviewed  Labs: Na 132 (L)   Diet Order:   Diet Order            Diet regular Room service appropriate? Yes; Fluid consistency: Thin  Diet effective now                 EDUCATION NEEDS:   Not appropriate for education at this time  Skin:  Skin Assessment: Reviewed RN Assessment  Last BM:  10/5  Height:   Ht Readings from Last 1 Encounters:  07/28/20 5\' 11"  (1.803 m)    Weight:   Wt Readings from Last 1 Encounters:  07/28/20 65.8 kg    BMI:  Body mass index is 20.22 kg/m.  Estimated Nutritional Needs:   Kcal:  2250-2450 kcal  Protein:  115-130 grams  Fluid:  >/= 2 L/day   09/27/20 RD, LDN Clinical Nutrition Pager listed in [facility]

## 2020-08-02 NOTE — Progress Notes (Signed)
08/02/20 1450  PT Visit Information  Last PT Received On 08/02/20  Assistance Needed +1  History of Present Illness Travis Mcgee is a 39 y.o. male with medical history significant of HIV/AIDS had been on HAART.Pt presents to ED with c/o pain and weakness in BLE.  Thought to be secondary to Guillain barre and was started on IVIG.   Precautions  Precautions Fall  Precaution Comments Has had multiple falls.   Restrictions  Weight Bearing Restrictions No  Home Living  Family/patient expects to be discharged to: Private residence  Living Arrangements Alone  Available Help at Discharge Other (Comment) (no one)  Type of Home Other(Comment) (States he lives in a shed. No running water, and a hot plate)  Home Access Level entry  Home Layout One level  Home Equipment None  Additional Comments Uses a bucket of water to bathe. Does not have a formal bathroom. Goes outside to use the bathroom.   Prior Function  Level of Independence Independent  Comments Works as a Doctor, general practice No difficulties  Pain Assessment  Pain Assessment Faces  Faces Pain Scale 4  Pain Location BLE   Pain Descriptors / Indicators Pins and needles;Tingling  Pain Intervention(s) Limited activity within patient's tolerance;Monitored during session;Repositioned  Cognition  Arousal/Alertness Awake/alert  Behavior During Therapy WFL for tasks assessed/performed  Overall Cognitive Status No family/caregiver present to determine baseline cognitive functioning  General Comments Pt with decreased safety awareness and decreased awareness of deficits.   Upper Extremity Assessment  Upper Extremity Assessment Defer to OT evaluation  Lower Extremity Assessment  Lower Extremity Assessment Generalized weakness  Cervical / Trunk Assessment  Cervical / Trunk Assessment Normal  Bed Mobility  Overal bed mobility Independent  Transfers  Overall transfer level Needs assistance  Equipment used None   Transfers Sit to/from Stand  Sit to Stand Min guard  General transfer comment Min guard for safety.   Ambulation/Gait  Ambulation/Gait assistance Min guard  Gait Distance (Feet) 100 Feet  Assistive device None  Gait Pattern/deviations Step-through pattern;Decreased stride length  General Gait Details Unsteady and reaching out for rail. Refused to use RW this session. Educated about monitoring fatigue levels.   Gait velocity Decreased  Balance  Overall balance assessment Needs assistance  Sitting-balance support No upper extremity supported  Sitting balance-Leahy Scale Good  Standing balance support No upper extremity supported;During functional activity  Standing balance-Leahy Scale Fair  Standing balance comment can maintain static standing without UE support   PT - End of Session  Equipment Utilized During Treatment Gait belt  Activity Tolerance Patient limited by pain  Patient left in bed;with call bell/phone within reach  Nurse Communication Mobility status  PT Assessment  PT Recommendation/Assessment Patient needs continued PT services  PT Visit Diagnosis Unsteadiness on feet (R26.81);Muscle weakness (generalized) (M62.81)  PT Problem List Decreased strength;Decreased balance;Decreased mobility;Decreased safety awareness;Decreased activity tolerance  Barriers to Discharge Decreased caregiver support;Inaccessible home environment  PT Plan  PT Frequency (ACUTE ONLY) Min 2X/week  PT Treatment/Interventions (ACUTE ONLY) Gait training;DME instruction;Functional mobility training;Therapeutic activities;Therapeutic exercise;Balance training;Patient/family education  AM-PAC PT "6 Clicks" Mobility Outcome Measure (Version 2)  Help needed turning from your back to your side while in a flat bed without using bedrails? 4  Help needed moving from lying on your back to sitting on the side of a flat bed without using bedrails? 4  Help needed moving to and from a bed to a chair (including a  wheelchair)? 3  Help  needed standing up from a chair using your arms (e.g., wheelchair or bedside chair)? 3  Help needed to walk in hospital room? 3  Help needed climbing 3-5 steps with a railing?  2  6 Click Score 19  Consider Recommendation of Discharge To: Home with [facility]  PT Recommendation  Follow Up Recommendations SNF  PT equipment None recommended by PT  Individuals Consulted  Consulted and Agree with Results and Recommendations Patient  Acute Rehab PT Goals  Patient Stated Goal get stronger in my legs..  PT Goal Formulation With patient  Time For Goal Achievement 08/16/20  Potential to Achieve Goals Fair  PT Time Calculation  PT Start Time (ACUTE ONLY) 0943  PT Stop Time (ACUTE ONLY) 1001  PT Time Calculation (min) (ACUTE ONLY) 18 min  PT General Charges  $$ ACUTE PT VISIT 1 Visit  PT Evaluation  $PT Eval Moderate Complexity 1 Mod   Pt admitted secondary to problem above with deficits above. Pt reporting increased pain and tingling in BLE. Also presenting with functional weakness. Requiring min guard A throughout secondary to unsteadiness. Pt with decreased safety awareness and decreased awareness of deficits. Currently lives alone in a "shed". Does not have a bathroom and normally uses a bucket of water for ADL tasks. Pt reports he plans to go to rehab before going back home. Will continue to follow acutely to maximize functional mobility independence and safety.   Farley Ly, PT, DPT  Acute Rehabilitation Services  Pager: [phone] Office: [phone]

## 2020-08-02 NOTE — Evaluation (Signed)
Occupational Therapy Evaluation Patient Details Name: Travis Mcgee MRN: [id number] DOB: 26-Dec-1980 Today's Date: 08/02/2020    History of Present Illness Tyric Rodeheaver is a 39 y.o. male with medical history significant of HIV/AIDS had been on HAART.Pt presents to ED with c/o pain and weakness in BLE.  He reports that over the past 7 to 10 days he has had worsening bilateral lower extremity pain, paresthesias and weakness.  He states that it is now progressed to the point that he is falling.   Clinical Impression   Patient admitted with the above diagnosis.  Lower extremity weakness, balance deficits and decreased safety impact full independence.  Currently he needs supervision for all self care and mobility due to the barriers listed above.  Patient was independent prior level with all care and mobility.  Discharge plan is unknown at this point; the patient believes he is going to CIR.  OT will follow in the acute setting for continued ADL and mobility goals, and to re-assess discharge planning as needed.  ? HH vs. No OT vs. More skilled rehab.      Follow Up Recommendations  Home health OT    Equipment Recommendations       Recommendations for Other Services       Precautions / Restrictions Precautions Precautions: Fall Precaution Comments: Has had multiple falls.  Restrictions Weight Bearing Restrictions: No      Mobility Bed Mobility Overal bed mobility: Independent                Transfers Overall transfer level: Needs assistance   Transfers: Sit to/from Stand Sit to Stand: Supervision         General transfer comment: moving in room with supervision and reaching for objects in his environment.    Balance Overall balance assessment: Needs assistance Sitting-balance support: No upper extremity supported Sitting balance-Leahy Scale: Good     Standing balance support: Single extremity supported Standing balance-Leahy Scale: Fair                              ADL either performed or assessed with clinical judgement   ADL Overall ADL's : Needs assistance/impaired Eating/Feeding: Independent;Sitting   Grooming: Supervision/safety;Standing   Upper Body Bathing: Supervision/ safety;Standing   Lower Body Bathing: Supervison/ safety;Sit to/from stand       Lower Body Dressing: Supervision/safety;Sit to/from stand   Toilet Transfer: Supervision/safety           Functional mobility during ADLs: Supervision/safety       Vision Baseline Vision/History: No visual deficits Patient Visual Report: No change from baseline       Perception     Praxis      Pertinent Vitals/Pain Pain Assessment: Faces Faces Pain Scale: Hurts little more Pain Location: Both legs. Pain Descriptors / Indicators: Tingling;Pins and needles Pain Intervention(s): Monitored during session     Hand Dominance Right   Extremity/Trunk Assessment Upper Extremity Assessment Upper Extremity Assessment: Overall WFL for tasks assessed   Lower Extremity Assessment Lower Extremity Assessment: Defer to PT evaluation   Cervical / Trunk Assessment Cervical / Trunk Assessment: Normal   Communication Communication Communication: No difficulties   Cognition Arousal/Alertness: Awake/alert Behavior During Therapy: WFL for tasks assessed/performed Overall Cognitive Status: Within Functional Limits for tasks assessed  General Comments: decreased safety   General Comments       Exercises     Shoulder Instructions      Home Living Family/patient expects to be discharged to:: Private residence Living Arrangements: Alone   Type of Home: Other(Comment) (States he lives in a shed. No running water, and a hot plate for cooking.) Home Access: Level entry     Home Layout: One level               Home Equipment: None   Additional Comments: Uses a bucket of water to bathe. Does not have a formal  bathroom. Goes outside to use the bathroom.       Prior Functioning/Environment Level of Independence: Independent        Comments: Works as a Architect: Decreased strength;Impaired balance (sitting and/or standing);Decreased activity tolerance;Pain;Impaired sensation      OT Treatment/Interventions: Self-care/ADL training;Therapeutic exercise;Therapeutic activities;Balance training    OT Goals(Current goals can be found in the care plan section) Acute Rehab OT Goals Patient Stated Goal: get stronger in my legs.. OT Goal Formulation: With patient Time For Goal Achievement: 08/16/20 Potential to Achieve Goals: Good ADL Goals Pt Will Perform Lower Body Bathing: Independently Pt Will Perform Lower Body Dressing: Independently Pt Will Transfer to Toilet: Independently Pt/caregiver will Perform Home Exercise Program: With theraband;With written HEP provided  OT Frequency: Min 1X/week   Barriers to D/C: Decreased caregiver support          Co-evaluation              AM-PAC OT "6 Clicks" Daily Activity     Outcome Measure Help from another person eating meals?: None Help from another person taking care of personal grooming?: None Help from another person toileting, which includes using toliet, bedpan, or urinal?: A Little Help from another person bathing (including washing, rinsing, drying)?: A Little Help from another person to put on and taking off regular upper body clothing?: A Little Help from another person to put on and taking off regular lower body clothing?: A Little 6 Click Score: 20   End of Session    Activity Tolerance: Patient tolerated treatment well Patient left: in chair;with call bell/phone within reach;with chair alarm set  OT Visit Diagnosis: Unsteadiness on feet (R26.81);Muscle weakness (generalized) (M62.81);Pain Pain - part of body:  (both feet)                Time: 1245-1301 OT Time Calculation (min): 16  min Charges:  OT General Charges $OT Visit: 1 Visit OT Evaluation $OT Eval Moderate Complexity: 1 Mod  08/02/2020  Rich, OTR/L  Acute Rehabilitation Services  Office:  [phone]   Suzanna Obey 08/02/2020, 1:16 PM

## 2020-08-02 NOTE — Progress Notes (Signed)
CCMD called to notify that Pt HR was 169. When I checked on the Pt he was up walking around his room. Pt asymptomatic and returned to his bed. After sitting in bed for 15 minutes HR returned to 90s. CCMD saved strip. Will continue to monitor.  Willa Frater, RN

## 2020-08-02 NOTE — Progress Notes (Signed)
    [facility]   Reason for visit: Follow up on HIV, Guillain Barre  Interval History: no acute events  Physical Exam: Constitutional:  Vitals:   08/02/20 0650 08/02/20 0820  BP: 94/64 113/74  Pulse:  93  Resp: 20 20  Temp: 98.1 F (36.7 C) 97.7 F (36.5 C)  SpO2: 98% 96%   patient appears in NAD  Impression: G-B, HIV  Plan: 1.  No changes, continue Biktarvy and Bactrim for OI prophylaxis.  He has follow up with Darletta Moll, NP on 10/28 at 10:15.   Treatment for GB per neurology.  I will sign off, call with any questions.

## 2020-08-03 DIAGNOSIS — B20 Human immunodeficiency virus [HIV] disease: Secondary | ICD-10-CM | POA: Diagnosis not present

## 2020-08-03 DIAGNOSIS — G610 Guillain-Barre syndrome: Secondary | ICD-10-CM | POA: Diagnosis not present

## 2020-08-03 DIAGNOSIS — R29898 Other symptoms and signs involving the musculoskeletal system: Secondary | ICD-10-CM | POA: Diagnosis not present

## 2020-08-03 DIAGNOSIS — Z8619 Personal history of other infectious and parasitic diseases: Secondary | ICD-10-CM | POA: Diagnosis not present

## 2020-08-03 DIAGNOSIS — E872 Acidosis: Secondary | ICD-10-CM | POA: Diagnosis not present

## 2020-08-03 LAB — BASIC METABOLIC PANEL
Anion gap: 7 (ref 5–15)
BUN: 7 mg/dL (ref 6–20)
CO2: 23 mmol/L (ref 22–32)
Calcium: 8.8 mg/dL — ABNORMAL LOW (ref 8.9–10.3)
Chloride: 103 mmol/L (ref 98–111)
Creatinine, Ser: 0.73 mg/dL (ref 0.61–1.24)
GFR calc non Af Amer: >60 mL/min (ref >60)
Glucose, Bld: 112 mg/dL — ABNORMAL HIGH (ref 70–99)
Potassium: 4.5 mmol/L (ref 3.5–5.1)
Sodium: 133 mmol/L — ABNORMAL LOW (ref 135–145)

## 2020-08-03 MED ORDER — TRAMADOL HCL 50 MG PO TABS
50.0000 mg | ORAL_TABLET | Freq: Four times a day (QID) | ORAL | Status: DC | PRN
  Administered 2020-08-03 – 2020-08-16 (×20): 50 mg via ORAL
  Filled 2020-08-03 (×21): qty 1

## 2020-08-03 MED ORDER — PENICILLIN G POTASSIUM 5000000 UNITS IJ SOLR
2.0000 10*6.[IU] | INTRAVENOUS | Status: DC
  Filled 2020-08-03 (×6): qty 2

## 2020-08-03 MED ORDER — PENICILLIN G POTASSIUM 20000000 UNITS IJ SOLR
6.0000 10*6.[IU] | Freq: Two times a day (BID) | INTRAVENOUS | Status: AC
  Administered 2020-08-03 – 2020-08-13 (×20): 6 10*6.[IU] via INTRAVENOUS
  Filled 2020-08-03 (×25): qty 6

## 2020-08-03 MED FILL — Immune Globulin (Human) IV Soln 5 GM/50ML: INTRAVENOUS | Qty: 50 | Status: AC

## 2020-08-03 MED FILL — Immune Globulin (Human) IV Soln 20 GM/200ML: INTRAVENOUS | Qty: 200 | Status: AC

## 2020-08-03 NOTE — Progress Notes (Signed)
    [facility]   Reason for visit: Follow up on ascending weakness of lower extremities  Interval History: little improvement on IVIg.  No fever.   Physical Exam: Constitutional:  Vitals:   08/03/20 0418 08/03/20 0820  BP: 106/70 107/74  Pulse: 88 (!) 110  Resp: 18 18  Temp: 98 F (36.7 C) 98.3 F (36.8 C)  SpO2: 96% 98%   patient appears in NAD  Impression: little improvement with treatment.  Discussed with the neurology team and still concern for other etiologies including neurosyphilis.  I believe this is still a low probability with no WBCs in the CSF, resolving RPR titer in the blood and negative VDRL but I do agree with neurology that it is still possible.   Plan: 1.  I will start IV penicillin.

## 2020-08-03 NOTE — Progress Notes (Signed)
Pt ambulated 470' in hall with minimal assistance.  Tolerated very well.

## 2020-08-03 NOTE — Progress Notes (Signed)
NIF -40 FVC 2.2

## 2020-08-03 NOTE — Progress Notes (Addendum)
Subjective: No improvement in strength. He has been able to walk around with PT and nursing but usually requires something to lean on as he feels he might fall. Tingling and sensitivity have increased up to the level of the umbilicus and sensitivity is worse today in the upper thighs and abdomen than in the lower extremities. He denies recent GI symptoms or cough. He feels like his voice has been hoarse for several days.   Objective: Current vital signs: BP 106/70 (BP Location: Left Arm)   Pulse 88   Temp 98 F (36.7 C) (Oral)   Resp 18   Ht 5\' 11"  (1.803 m)   Wt 65.8 kg   SpO2 96%   BMI 20.22 kg/m  Vital signs in last 24 hours: Temp:  [97.7 F (36.5 C)-98.9 F (37.2 C)] 98 F (36.7 C) (10/08 0418) Pulse Rate:  [84-110] 88 (10/08 0418) Resp:  [15-21] 18 (10/08 0418) BP: (95-117)/(60-81) 106/70 (10/08 0418) SpO2:  [96 %-100 %] 96 % (10/08 0418)  Intake/Output from previous day: 10/07 0701 - 10/08 0700 In: 730 [P.O.:480; I.V.:250] Out: 1950 [Urine:1950] Intake/Output this shift: No intake/output data recorded. Nutritional status:  Diet Order            Diet regular Room service appropriate? Yes; Fluid consistency: Thin  Diet effective now                 Neurologic Exam: Mental Status: A&Ox4, speech fluent, appropriate thought content, normal affect Cranial Nerves: III,IV, VI: no ptsosis, EOM intact, no ptosis V, VII: no facial droop, smile symmetric VIII: hearing intact to conversation  IX, X: voice hoarse  XI: symmetric shoulder shrug Motor: BUE: strength 5/5 and symmetric BLE: strength 2/5, partially secondary to pain with hip flexion; knee flexion/extension 4/5, plantar/dorsiflexion 5/5  Sensory: Decreased temperature discrimination lateral lower legs, no discrimination feet bilaterally; light touch sensation intact Increased sensitivity to temperature and allodynia thighs and abdomen bilaterally  Deep Tendon Reflexes: 0 patellar bilaterally  0 achilles  reflex 2+ brachioradialis reflex bilaterally  Cerebellar: Positive romberg Gait: Slow, requires hand to steady   Lab Results: Results for orders placed or performed during the hospital encounter of 07/28/20 (from the past 48 hour(s))  Magnesium     Status: None   Collection Time: 08/01/20 12:30 PM  Result Value Ref Range   Magnesium 1.7 1.7 - 2.4 mg/dL    Comment: Performed at [facility], [address] [place], [place]  Basic metabolic panel     Status: Abnormal   Collection Time: 08/02/20  2:07 AM  Result Value Ref Range   Sodium 132 (L) 135 - 145 mmol/L   Potassium 4.7 3.5 - 5.1 mmol/L   Chloride 104 98 - 111 mmol/L   CO2 23 22 - 32 mmol/L   Glucose, Bld 102 (H) 70 - 99 mg/dL    Comment: Glucose reference range applies only to samples taken after fasting for at least 8 hours.   BUN 5 (L) 6 - 20 mg/dL   Creatinine, Ser 10/02/20 0.61 - 1.24 mg/dL   Calcium 8.5 (L) 8.9 - 10.3 mg/dL   GFR calc non Af Amer >60 >60 mL/min   Anion gap 5 5 - 15    Comment: Performed at [facility], [address], [place], [place]  Magnesium     Status: None   Collection Time: 08/02/20  2:07 AM  Result Value Ref Range   Magnesium 1.9 1.7 - 2.4  mg/dL    Comment: Performed at [facility], [address] [place], [place] [address]  Basic metabolic panel     Status: Abnormal   Collection Time: 08/03/20 12:49 AM  Result Value Ref Range   Sodium 133 (L) 135 - 145 mmol/L   Potassium 4.5 3.5 - 5.1 mmol/L   Chloride 103 98 - 111 mmol/L   CO2 23 22 - 32 mmol/L   Glucose, Bld 112 (H) 70 - 99 mg/dL    Comment: Glucose reference range applies only to samples taken after fasting for at least 8 hours.   BUN 7 6 - 20 mg/dL   Creatinine, Ser 6.570.73 0.61 - 1.24 mg/dL   Calcium 8.8 (L) 8.9 - 10.3 mg/dL   GFR calc non Af Amer >60 >60 mL/min   Anion gap 7 5 - 15    Comment: Performed at [facility], [address] [place], [place] [address]    Recent Results  (from the past 240 hour(s))  Respiratory Panel by RT PCR (Flu A&B, Covid) - Nasopharyngeal Swab     Status: None   Collection Time: 07/28/20  1:05 PM   Specimen: Nasopharyngeal Swab  Result Value Ref Range Status   SARS Coronavirus 2 by RT PCR NEGATIVE NEGATIVE Corrected   Influenza A by PCR NEGATIVE NEGATIVE Final   Influenza B by PCR NEGATIVE NEGATIVE Final    Comment: (NOTE) The Xpert Xpress SARS-CoV-2/FLU/RSV assay is intended as an aid in  the diagnosis of influenza from Nasopharyngeal swab specimens and  should not be used as a sole basis for treatment. Nasal washings and  aspirates are unacceptable for Xpert Xpress SARS-CoV-2/FLU/RSV  testing.  Fact Sheet for Patients: https://www.moore.com/https://www.fda.gov/media/142436/download  Fact Sheet for Healthcare Providers: https://www.young.biz/https://www.fda.gov/media/142435/download  This test is not yet approved or cleared by the [place] FDA and  has been authorized for detection and/or diagnosis of SARS-CoV-2 by  FDA under an Emergency Use Authorization (EUA). This EUA will remain  in effect (meaning this test can be used) for the duration of the  Covid-19 declaration under Section 564(b)(1) of the Act, 21  U.S.C. section 360bbb-3(b)(1), unless the authorization is  terminated or revoked. Performed at [facility], [address] [place], [place] [address]   Culture, blood (routine x 2)     Status: None   Collection Time: 07/28/20  3:24 PM   Specimen: BLOOD RIGHT HAND  Result Value Ref Range Status   Specimen Description   Final    BLOOD RIGHT HAND Performed at [facility], [address] [place], [place] [address]    Special Requests   Final    BOTTLES DRAWN AEROBIC AND ANAEROBIC Blood Culture adequate volume Performed at [facility], [address] [place], [place] [address]    Culture   Final    NO GROWTH 5 DAYS Performed at [facility], [address] [place], [place]  [address]    Report Status 08/02/2020 FINAL  Final  CSF culture     Status: None   Collection Time: 07/28/20  8:19 PM   Specimen: CSF; Cerebrospinal Fluid  Result Value Ref Range Status   Specimen Description   Final    CSF Performed at [facility], [address] [place], [place] [address]    Special Requests   Final    Immunocompromised Performed at [facility], [address] [place], [place] [address]  Gram Stain   Final    WBC PRESENT, PREDOMINANTLY MONONUCLEAR NO ORGANISMS SEEN CYTOSPIN SMEAR    Culture   Final    NO GROWTH Performed at [facility], [address] [place], [place] [address]    Report Status 08/01/2020 FINAL  Final  Fungus Culture With Stain     Status: None (Preliminary result)   Collection Time: 07/28/20  8:19 PM   Specimen: Lumbar Puncture; Cerebrospinal Fluid  Result Value Ref Range Status   Fungus Stain Final report  Final    Comment: (NOTE) Performed At: [facility] [address] [place], [place] [id number] Jolene Schimke MD KW:[id number]    Fungus (Mycology) Culture PENDING  Incomplete   Fungal Source CSF  Final    Comment: Performed at [facility], [address] [place], [place] [address]  Fungus Culture Result     Status: None   Collection Time: 07/28/20  8:19 PM  Result Value Ref Range Status   Result 1 Comment  Final    Comment: (NOTE) KOH/Calcofluor preparation:  no fungus observed. Performed At: [facility] [address] [place], [place] [id number] Jolene Schimke MD [id number]   HSV 1/2 Ab IgG/IgM CSF     Status: None   Collection Time: 07/28/20  9:28 PM   Specimen: CSF; Cerebrospinal Fluid  Result Value Ref Range Status   HSV 1/2 Ab, IgM, CSF 0.12 <=0.89 IV Final    Comment: (NOTE) INTERPRETIVE INFORMATION: Herpes Simplex Virus                          Type 1 and/or 2 Antibodies,                          IgM by ELISA, CSF  0.89 IV or  Less .......... Negative: No significant                             level of detectable HSV IgM                             antibody.  0.90 - 1.09 IV ........... Equivocal: Questionable                             presence of IgM antibodies.                             Repeat testing in 10-14 days                             may be helpful.  1.10 IV or Greater ....... Positive: IgM antibody to HSV                             detected, which may indicate a                             current or recent infection.                             However, low levels of  IgM                             antibodies may occasionally                             persist for more than 12                             months post-infection. The detection of antibodies to herpes simplex virus in CSF may indicate central nervou s system infection. However, consideration must be given to possible contamination by blood or transfer of serum antibodies across the blood-brain barrier. Fourfold or greater rise in CSF antibodies to herpes on specimens at least 4 weeks apart are found in 74-94 % of patients with herpes encephalitis. Specificity of the test based on a single CSF testing is not established. Presently PCR is the primary means of establishing a diagnosis of herpes encephalitis. This test was developed and its performance characteristics determined by [facility]. It has not been cleared or approved by the [place] Food and Drug Administration. This test was performed in a CLIA certified laboratory and is intended for clinical purposes.    HSV 1/2 Ab Screen IgG, CSF 0.69 <=0.89 IV Final    Comment: (NOTE) INTERPRETIVE INFORMATION: Herpes Simplex Virus Type 1 and/or 2                    Antibodies, IgG CSF  0.89 IV or Less .......... Negative: No significant                             level of detectable HSV IgG                             antibody.  0.90 - 1.09 IV ........... Equivocal:  Questionable                             presence of IgG antibodies.                             Repeat testing in 10-14 days                             may be helpful.  1.10 IV or Greater ....... Positive: IgG antibody to HSV                             detected, which may indicate                             a current or past HSV                             infection. The detection of antibodies to herpes simplex virus in CSF may indicate central nervous system infection. However, consideration must be given to possible contamination by blood or transfer of serum antibodies across the blood-brain barrier. Fourfold or greater rise in CSF antibodies to herpes on specimens  at l east 4 weeks apart are found in 74-94 % of patients with herpes encephalitis. Specificity of the test based on a single CSF testing is not established. Presently PCR is the primary means of establishing a diagnosis of herpes encephalitis. This test was developed and its performance characteristics determined by [facility]. It has not been cleared or approved by the [place] Food and Drug Administration. This test was performed in a CLIA certified laboratory and is intended for clinical purposes. Performed At: [facility] [address] [place], [place] [id number] Elinor Parkinson I MD MV:[id number]     Lipid Panel No results for input(s): CHOL, TRIG, HDL, CHOLHDL, VLDL, LDLCALC in the last 72 hours.  Studies/Results: No results found.  Inpatient Medications: I have reviewed the patient's current medications. . bictegravir-emtricitabine-tenofovir AF  1 tablet Oral Daily  . enoxaparin (LOVENOX) injection  40 mg Subcutaneous Q24H  . feeding supplement (ENSURE ENLIVE)  237 mL Oral TID BM  . gabapentin  600 mg Oral TID  . multivitamin with minerals  1 tablet Oral Daily  . sulfamethoxazole-trimethoprim  1 tablet Oral Daily     Assessment and Recommendations: 39 year old male with ascending  weakness, pain and sensory loss  - Initial exam and history compatible with Guillain-Barre syndrome. LP showed elevated CSF IgG/albumin ratio. There was no albuminocytologic dissociation, somewhat atypical, but a result which can be seen in 10-18% of cases.  - Sensory symptoms worsening despite four days of IVIG therapy. If no improvement tomorrow after day 5/5, he may need plasma exchange.  - With symptoms unimproved/worsening, alternate etiologies such as recurrence of syphilis with central or peripheral nervous system manifestations should be considered. Also a consideration would be an acute polyradiculoneuritis secondary to his known HIV infection. Discussed with ID; they agree that while improved titers of HIV and syphilis as well as LP findings make this less likely, neuropathy and weakness could still be secondary to neurosyphilis. ID recommended on 10/04 additional PCN treatment considering recent sexual activity. May also consider a trial of treatment for neurosyphilis. - MRI without signs of HIV myelopathy. HIV quant here 3000 and he has been restarted on Biktarvy. Symptoms unlikely to be related to be HIV radiculitis but this has not been completely ruled out. Regardless, HIV is being treated as he is back on his Biktarvy.  - Recommend thiamine level    LOS: 6 days   Versie Starks, DO 08/03/2020, 8:08 AM Pager: [phone]  Electronically signed: Dr. Caryl Pina

## 2020-08-03 NOTE — Progress Notes (Signed)
PROGRESS NOTE    Travis Mcgee  FXT:[id number] DOB: June 19, 1981 DOA: 07/28/2020 PCP: Patient, No Pcp Per    Brief Narrative:  Patient admitted to the hospital with working diagnosis of [name] syndrome.  39 year old male with significant past medical history for HIV/AIDSand latent syphiliswho presented with bilateral extremity pain. Positive pain for the last 7 to 10 days, associated with paresthesias and weakness. Progressive symptoms with multiple falls. On his initial physical examination blood pressure 118/86, heart rate 110, respiratory rate 36, oxygen saturation 100%. His lungs are clear to auscultation, heart S1-S2, present rhythmic, soft abdomen, lower extremity edema. His strength was preserved upper extremities, he had a slow gait.  Patient underwent further work-up with CT head of brain MRI which were negative. MRI of the cervical thoracic lumbar spine was unremarkable. Lumbar puncture showed negative culture, negative cryptococcus antigen and VDRL negative. He had elevated CSF IgG/albumin ratio consistent with intrathecal antibiotic production.  Neurology was consulted with recommendations to administer IVIG for 5 days.  Patient continue to have persistent lower extremity weakness and painful paresthesias.    Assessment & Plan:   Principal Problem:   Guillain Barr syndrome (HCC) Active Problems:   AIDS (acquired immune deficiency syndrome) (HCC)   History of syphilis   Lactic acidosis   1. Guillain Barre syndrome.Continue to have weakness at the lower extremities, and painful paresthesias and burning sensation. On IV IG #4/5  Continue with neuro checks, PT/OT evaluation.  Continue with gabapentin to 600 mg tid and added tramadol for pain control.  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   2. HIV/ AIDS. Continue with antiretroviral therapy. PO prophylactic bactrim.  Nutritional supplements.   3. Chronic disease  anemia. Stable cell count.    4. Hyponatremia, hypomagenesemia and hypokalemia. Improved electrolytes with Na at 133, K at 4,5 and bicarbonate at 23. Mg 1.9 Patient is tolerating po well. Continue to monitor renal function while getting IV IG infusions.    Status is: Inpatient  Remains inpatient appropriate because:IV treatments appropriate due to intensity of illness or inability to take PO   Dispo:  Patient From: Home  Planned Disposition: Home  Expected discharge date: 08/05/20  Medically stable for discharge: No    DVT prophylaxis: Enoxaparin   Code Status:   full  Family Communication:  No family at the bedside      Nutrition Status: Nutrition Problem: Increased nutrient needs Etiology: acute illness Signs/Symptoms: estimated needs Interventions: Ensure Enlive (each supplement provides 350kcal and 20 grams of protein), MVI     Consultants:   Neurology     Subjective: Patient continue to complain of persistent weakness at the lower extremities and persistent painful paresthesias, not much improvement with medical regimen.   Objective: Vitals:   08/03/20 0109 08/03/20 0152 08/03/20 0418 08/03/20 0820  BP: 107/60 95/62 106/70 107/74  Pulse: 88 90 88 (!) 110  Resp: 20 18 18 18   Temp: 97.9 F (36.6 C) 97.7 F (36.5 C) 98 F (36.7 C) 98.3 F (36.8 C)  TempSrc: Oral Oral Oral Oral  SpO2: 96% 96% 96% 98%  Weight:      Height:        Intake/Output Summary (Last 24 hours) at 08/03/2020 1059 Last data filed at 08/03/2020 0544 Gross per 24 hour  Intake 730 ml  Output 1250 ml  Net -520 ml   Filed Weights   07/28/20 1352  Weight: 65.8 kg    Examination:   General: Not in pain  or dyspnea deconditioned  Neurology: Awake and alert, non focal  E ENT: no pallor, no icterus, oral mucosa moist Cardiovascular: No JVD. S1-S2 present, rhythmic, no gallops, rubs, or murmurs. No lower extremity edema. Pulmonary: positive breath sounds bilaterally, adequate air  movement, no wheezing, rhonchi or rales. Gastrointestinal. Abdomen soft and non tender Skin. No rashes Musculoskeletal: no joint deformities     Data Reviewed: I have personally reviewed following labs and imaging studies  CBC: Recent Labs  Lab 07/28/20 1303 07/28/20 1303 07/29/20 0419 07/30/20 0623 07/30/20 1055 07/31/20 1454 08/01/20 0440  WBC 6.9  --  4.7 3.3*  --  3.9* 4.0  NEUTROABS 4.6  --   --  1.2*  --   --   --   HGB 11.5*   < > 9.6* 7.6* 10.2* 10.0* 9.6*  HCT 33.4*   < > 28.3* 22.3* 29.9* 30.2* 29.7*  MCV 102.5*  --  102.9* 106.2*  --  104.1* 106.8*  PLT 195  --  155 128*  --  202 213   < > = values in this interval not displayed.   Basic Metabolic Panel: Recent Labs  Lab 07/28/20 1303 07/28/20 1314 07/29/20 0419 07/30/20 0623 07/31/20 1454 08/01/20 0440 08/01/20 1230 08/02/20 0207 08/03/20 0049  NA   < >  --  132* 135  --  133*  --  132* 133*  K   < >  --  3.3* 3.7  --  4.5  --  4.7 4.5  CL   < >  --  101 109  --  101  --  104 103  CO2   < >  --  22 19*  --  26  --  23 23  GLUCOSE   < >  --  99 84  --  81  --  102* 112*  BUN   < >  --  <5* <5*  --  <5*  --  5* 7  CREATININE   < >  --  0.63 0.51*  --  0.70  --  0.64 0.73  CALCIUM   < >  --  7.8* 7.6*  --  8.6*  --  8.5* 8.8*  MG  --  1.8  --  1.6* 1.6*  --  1.7 1.9  --   PHOS  --   --   --  3.5  --   --   --   --   --    < > = values in this interval not displayed.   GFR: Estimated Creatinine Clearance: 114.2 mL/min (by C-G formula based on SCr of 0.73 mg/dL). Liver Function Tests: Recent Labs  Lab 07/28/20 1303 07/28/20 2128 07/29/20 0419 07/30/20 0623 07/31/20 1454  AST 98*  --  78* 62* 59*  ALT 41  --  38 38 47*  ALKPHOS 190*  --  139* 114 125  BILITOT 2.4*  --  0.9 0.5 0.4  PROT 8.2*  --  6.7 6.1* 7.4  ALBUMIN 2.4* 2.8* 1.9* 1.9* 1.9*   No results for input(s): LIPASE, AMYLASE in the last 168 hours. No results for input(s): AMMONIA in the last 168 hours. Coagulation Profile: No  results for input(s): INR, PROTIME in the last 168 hours. Cardiac Enzymes: Recent Labs  Lab 07/28/20 1303  CKTOTAL 122   BNP (last 3 results) No results for input(s): PROBNP in the last 8760 hours. HbA1C: No results for input(s): HGBA1C in the last 72 hours. CBG: Recent Labs  Lab  07/31/20 1106  GLUCAP 105*   Lipid Profile: No results for input(s): CHOL, HDL, LDLCALC, TRIG, CHOLHDL, LDLDIRECT in the last 72 hours. Thyroid Function Tests: Recent Labs    07/31/20 1454  TSH 3.347   Anemia Panel: Recent Labs    07/31/20 1454  VITAMINB12 439  FOLATE 5.6*  FERRITIN 631*      Radiology Studies: I have reviewed all of the imaging during this hospital visit personally     Scheduled Meds: . bictegravir-emtricitabine-tenofovir AF  1 tablet Oral Daily  . enoxaparin (LOVENOX) injection  40 mg Subcutaneous Q24H  . feeding supplement (ENSURE ENLIVE)  237 mL Oral TID BM  . gabapentin  600 mg Oral TID  . multivitamin with minerals  1 tablet Oral Daily  . sulfamethoxazole-trimethoprim  1 tablet Oral Daily   Continuous Infusions: . Immune Globulin 10% (PRIVIGEN) 25 g Stopped (08/03/20 0126)     LOS: 6 days        Leandrea Ackley Annett Gula, MD

## 2020-08-04 DIAGNOSIS — Z8619 Personal history of other infectious and parasitic diseases: Secondary | ICD-10-CM | POA: Diagnosis not present

## 2020-08-04 DIAGNOSIS — B20 Human immunodeficiency virus [HIV] disease: Secondary | ICD-10-CM | POA: Diagnosis not present

## 2020-08-04 DIAGNOSIS — G610 Guillain-Barre syndrome: Secondary | ICD-10-CM | POA: Diagnosis not present

## 2020-08-04 MED ORDER — CYCLOBENZAPRINE HCL 10 MG PO TABS
5.0000 mg | ORAL_TABLET | Freq: Three times a day (TID) | ORAL | Status: DC
  Administered 2020-08-04 – 2020-08-05 (×4): 5 mg via ORAL
  Filled 2020-08-04 (×4): qty 1

## 2020-08-04 NOTE — Progress Notes (Signed)
Patient ambulated in hallway with minimal assist with walker and nursing staff approximately 240 feet. Back inroom.patient with complaints of pain in inner part of leg but gait slow but steady.  Will monitor patient. Lashan Macias, [name] An RN

## 2020-08-04 NOTE — Progress Notes (Signed)
[facility]   Reason for visit: Follow up on lower extremity ascending weakness and pain  Interval History: no improvement in his pain/sensitivity but able to walk a bit more with a walker.  No associated rash or diarrhea.  No new positive findings. Completed 5/5 days of IVIg.    day 2 penicillin Physical Exam: Constitutional:  Vitals:   08/04/20 0842 08/04/20 0843  BP:  131/78  Pulse:    Resp: 18   Temp: 97.9 F (36.6 C) 97.9 F (36.6 C)  SpO2: 97%    patient appears in NAD Respiratory: Normal respiratory effort; CTA B Cardiovascular: RRR GI: soft, nt, nd  Review of Systems: Constitutional: negative for fevers and chills Gastrointestinal: negative for nausea and diarrhea Integument/breast: negative for rash  Lab Results  Component Value Date   WBC 4.0 08/01/2020   HGB 9.6 (L) 08/01/2020   HCT 29.7 (L) 08/01/2020   MCV 106.8 (H) 08/01/2020   PLT 213 08/01/2020    Lab Results  Component Value Date   CREATININE 0.73 08/03/2020   BUN 7 08/03/2020   NA 133 (L) 08/03/2020   K 4.5 08/03/2020   CL 103 08/03/2020   CO2 23 08/03/2020    Lab Results  Component Value Date   ALT 47 (H) 07/31/2020   AST 59 (H) 07/31/2020   ALKPHOS 125 07/31/2020     Microbiology: Recent Results (from the past 240 hour(s))  Respiratory Panel by RT PCR (Flu A&B, Covid) - Nasopharyngeal Swab     Status: None   Collection Time: 07/28/20  1:05 PM   Specimen: Nasopharyngeal Swab  Result Value Ref Range Status   SARS Coronavirus 2 by RT PCR NEGATIVE NEGATIVE Corrected   Influenza A by PCR NEGATIVE NEGATIVE Final   Influenza B by PCR NEGATIVE NEGATIVE Final    Comment: (NOTE) The Xpert Xpress SARS-CoV-2/FLU/RSV assay is intended as an aid in  the diagnosis of influenza from Nasopharyngeal swab specimens and  should not be used as a sole basis for treatment. Nasal washings and  aspirates are unacceptable for Xpert Xpress SARS-CoV-2/FLU/RSV  testing.  Fact  Sheet for Patients: https://www.moore.com/  Fact Sheet for Healthcare Providers: https://www.young.biz/  This test is not yet approved or cleared by the [place] FDA and  has been authorized for detection and/or diagnosis of SARS-CoV-2 by  FDA under an Emergency Use Authorization (EUA). This EUA will remain  in effect (meaning this test can be used) for the duration of the  Covid-19 declaration under Section 564(b)(1) of the Act, 21  U.S.C. section 360bbb-3(b)(1), unless the authorization is  terminated or revoked. Performed at [facility], [address] [place], [place] [address]   Culture, blood (routine x 2)     Status: None   Collection Time: 07/28/20  3:24 PM   Specimen: BLOOD RIGHT HAND  Result Value Ref Range Status   Specimen Description   Final    BLOOD RIGHT HAND Performed at [facility], [address], [place], [place] [address]    Special Requests   Final    BOTTLES DRAWN AEROBIC AND ANAEROBIC Blood Culture adequate volume Performed at [facility], [address] [place], [place] [address]    Culture   Final    NO GROWTH 5 DAYS Performed at [facility], [address] [place], [place] [address]    Report Status 08/02/2020 FINAL  Final  CSF culture  Status: None   Collection Time: 07/28/20  8:19 PM   Specimen: CSF; Cerebrospinal Fluid  Result Value Ref Range Status   Specimen Description   Final    CSF Performed at [facility], [address] [place], [place] [address]    Special Requests   Final    Immunocompromised Performed at [facility], [address] [place], [place] [address]    Gram Stain   Final    WBC PRESENT, PREDOMINANTLY MONONUCLEAR NO ORGANISMS SEEN CYTOSPIN SMEAR    Culture   Final    NO GROWTH Performed at [facility], [address] [place], [place] [address]    Report  Status 08/01/2020 FINAL  Final  Fungus Culture With Stain     Status: None (Preliminary result)   Collection Time: 07/28/20  8:19 PM   Specimen: Lumbar Puncture; Cerebrospinal Fluid  Result Value Ref Range Status   Fungus Stain Final report  Final    Comment: (NOTE) Performed At: [facility] [address] [place], [place] [id number] Jolene SchimkeNagendra Sanjai MD UU:[id number]:[phone]    Fungus (Mycology) Culture PENDING  Incomplete   Fungal Source CSF  Final    Comment: Performed at [facility], [address] [place], [place] [address]  Fungus Culture Result     Status: None   Collection Time: 07/28/20  8:19 PM  Result Value Ref Range Status   Result 1 Comment  Final    Comment: (NOTE) KOH/Calcofluor preparation:  no fungus observed. Performed At: [facility] [address] [place], [place] [id number] Jolene SchimkeNagendra Sanjai MD PP:[id number]:[phone]   HSV 1/2 Ab IgG/IgM CSF     Status: None   Collection Time: 07/28/20  9:28 PM   Specimen: CSF; Cerebrospinal Fluid  Result Value Ref Range Status   HSV 1/2 Ab, IgM, CSF 0.12 <=0.89 IV Final    Comment: (NOTE) INTERPRETIVE INFORMATION: Herpes Simplex Virus                          Type 1 and/or 2 Antibodies,                          IgM by ELISA, CSF  0.89 IV or Less .......... Negative: No significant                             level of detectable HSV IgM                             antibody.  0.90 - 1.09 IV ........... Equivocal: Questionable                             presence of IgM antibodies.                             Repeat testing in 10-14 days                             may be helpful.  1.10 IV or Greater ....... Positive: IgM antibody to HSV  detected, which may indicate a                             current or recent infection.                             However, low levels of IgM                             antibodies may occasionally                             persist for more  than 12                             months post-infection. The detection of antibodies to herpes simplex virus in CSF may indicate central nervou s system infection. However, consideration must be given to possible contamination by blood or transfer of serum antibodies across the blood-brain barrier. Fourfold or greater rise in CSF antibodies to herpes on specimens at least 4 weeks apart are found in 74-94 % of patients with herpes encephalitis. Specificity of the test based on a single CSF testing is not established. Presently PCR is the primary means of establishing a diagnosis of herpes encephalitis. This test was developed and its performance characteristics determined by [facility]. It has not been cleared or approved by the [place] Food and Drug Administration. This test was performed in a CLIA certified laboratory and is intended for clinical purposes.    HSV 1/2 Ab Screen IgG, CSF 0.69 <=0.89 IV Final    Comment: (NOTE) INTERPRETIVE INFORMATION: Herpes Simplex Virus Type 1 and/or 2                    Antibodies, IgG CSF  0.89 IV or Less .......... Negative: No significant                             level of detectable HSV IgG                             antibody.  0.90 - 1.09 IV ........... Equivocal: Questionable                             presence of IgG antibodies.                             Repeat testing in 10-14 days                             may be helpful.  1.10 IV or Greater ....... Positive: IgG antibody to HSV                             detected, which may indicate                             a current or past HSV  infection. The detection of antibodies to herpes simplex virus in CSF may indicate central nervous system infection. However, consideration must be given to possible contamination by blood or transfer of serum antibodies across the blood-brain barrier. Fourfold or greater rise in CSF antibodies to herpes on  specimens at l east 4 weeks apart are found in 74-94 % of patients with herpes encephalitis. Specificity of the test based on a single CSF testing is not established. Presently PCR is the primary means of establishing a diagnosis of herpes encephalitis. This test was developed and its performance characteristics determined by [facility]. It has not been cleared or approved by the [place] Food and Drug Administration. This test was performed in a CLIA certified laboratory and is intended for clinical purposes. Performed At: [facility] [address] [place], [place] [address] Elinor Parkinson I MD WU:[id number]     Impression/Plan:  1. Ascending weakness and parasthesias - possible G-B and completed IVIg.  Not much improvement at this time.  Further plans per neurology regarding ? Plasma exchange.  2.  Possible neurosyphilis - no WBCs in the CSF, negative VDRL, peripheral titer much improved from previous.  I have put him on penicillin, though low likelihood.   Will continue penicillin for 10 days.   3.  HIV - viral load noted and now on Biktarvy.  Tolerating this well.

## 2020-08-04 NOTE — Progress Notes (Signed)
PROGRESS NOTE    Travis Mcgee  BZJ:[id number] DOB: 1981-08-19 DOA: 07/28/2020 PCP: Patient, No Pcp Per    Brief Narrative:  Patient admitted to the hospital with working diagnosis of Travis Mcgee syndrome.  39 year old male with significant past medical history for HIV/AIDSand latent syphiliswho presented with bilateral extremity pain. Positive pain for the last 7 to 10 days, associated with paresthesias and weakness. Progressive symptoms with multiple falls. On his initial physical examination blood pressure 118/86, heart rate 110, respiratory rate 36, oxygen saturation 100%. His lungs are clear to auscultation, heart S1-S2, present rhythmic, soft abdomen, lower extremity edema. His strength was preserved upper extremities, he had a slow gait.  Patient underwent further work-up with CT head of brain MRI which were negative. MRI of the cervical thoracic lumbar spine was unremarkable. Lumbar puncture showed negative culture, negative cryptococcus antigen and VDRL negative. He had elevated CSF IgG/albumin ratio consistent with intrathecal antibiotic production.  Neurology was consulted with recommendations to administer IVIG for 5 days.  Patient continue to have persistent lower extremity weakness and painful paresthesias.  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     Assessment & Plan:   Principal Problem:   Guillain Barr syndrome (HCC) Active Problems:   AIDS (acquired immune deficiency syndrome) (HCC)   History of syphilis   Lactic acidosis    1. 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 Sp  IV IG #5/5, his strength has improved but not yet back to baseline, continue to have painful paresthesias and difficulty ambulating. Ck 122 on 10/02/  Continue pain control with gabapentin and tramadol, will add tid cyclobenzaprine, and continue PT and OT.  Out of bed to chair tid with meals.  On penicillin per ID recommendations.  Follow CK in am.    2. HIV/ AIDS.Tolerating wellantiretroviral therapy and prophylacticbactrim. Continue withnutritional supplements.  3. Chronic disease anemia.Stable cell count.  4. Hyponatremia, hypomagenesemia and hypokalemia.Stable renal function with serum cr at 0,73, K is 4,5 and bicarbonate is 23. Patient is tolerating po well.    Status is: Inpatient  Remains inpatient appropriate because:Inpatient level of care appropriate due to severity of illness   Dispo:  Patient From: Home  Planned Disposition: Home  Expected discharge date: 08/06/20  Medically stable for discharge: No pending final Pt and ot recommendations, for possible SNF, CIR or home health.     DVT prophylaxis: Enoxaparin   Code Status:   full  Family Communication:  No family at the bedside      Nutrition Status: Nutrition Problem: Increased nutrient needs Etiology: acute illness Signs/Symptoms: estimated needs Interventions: Ensure Enlive (each supplement provides 350kcal and 20 grams of protein), MVI      Consultants:   ID  NEurology    Subjective: Patient with improved strength at the lower extremities, but continue to be weak, not at baseline. Pain at the distal thigh making mobility more limited.   Objective: Vitals:   08/04/20 0213 08/04/20 0401 08/04/20 0842 08/04/20 0843  BP: 101/79 98/69  131/78  Pulse: 86 84    Resp: 17 16 18    Temp: 98.3 F (36.8 C) 98.1 F (36.7 C) 97.9 F (36.6 C) 97.9 F (36.6 C)  TempSrc: Oral Oral Oral Oral  SpO2: 97% 95% 97%   Weight:      Height:        Intake/Output Summary (Last 24 hours) at 08/04/2020 1043 Last data filed at 08/04/2020 0800 Gross per 24 hour  Intake 1385.61 ml  Output 1900 ml  Net -514.39 ml   Filed Weights   07/28/20 1352  Weight: 65.8 kg    Examination:   General: Not in pain or dyspnea, deconditioned  Neurology: Awake and alert, lower extremities, strength is 3/5 bilaterally  E ENT: no pallor, no icterus, oral  mucosa moist Cardiovascular: No JVD. S1-S2 present, rhythmic, no gallops, rubs, or murmurs. No lower extremity edema. Pulmonary: positive breath sounds bilaterally, adequate air movement, no wheezing, rhonchi or rales. Gastrointestinal. Abdomen soft and non tender Skin. No rashes Musculoskeletal: tender to palpation on inner distal thigh bilateral.      Data Reviewed: I have personally reviewed following labs and imaging studies  CBC: Recent Labs  Lab 07/28/20 1303 07/28/20 1303 07/29/20 0419 07/30/20 0623 07/30/20 1055 07/31/20 1454 08/01/20 0440  WBC 6.9  --  4.7 3.3*  --  3.9* 4.0  NEUTROABS 4.6  --   --  1.2*  --   --   --   HGB 11.5*   < > 9.6* 7.6* 10.2* 10.0* 9.6*  HCT 33.4*   < > 28.3* 22.3* 29.9* 30.2* 29.7*  MCV 102.5*  --  102.9* 106.2*  --  104.1* 106.8*  PLT 195  --  155 128*  --  202 213   < > = values in this interval not displayed.   Basic Metabolic Panel: Recent Labs  Lab 07/28/20 1303 07/28/20 1314 07/29/20 0419 07/30/20 0623 07/31/20 1454 08/01/20 0440 08/01/20 1230 08/02/20 0207 08/03/20 0049  NA   < >  --  132* 135  --  133*  --  132* 133*  K   < >  --  3.3* 3.7  --  4.5  --  4.7 4.5  CL   < >  --  101 109  --  101  --  104 103  CO2   < >  --  22 19*  --  26  --  23 23  GLUCOSE   < >  --  99 84  --  81  --  102* 112*  BUN   < >  --  <5* <5*  --  <5*  --  5* 7  CREATININE   < >  --  0.63 0.51*  --  0.70  --  0.64 0.73  CALCIUM   < >  --  7.8* 7.6*  --  8.6*  --  8.5* 8.8*  MG  --  1.8  --  1.6* 1.6*  --  1.7 1.9  --   PHOS  --   --   --  3.5  --   --   --   --   --    < > = values in this interval not displayed.   GFR: Estimated Creatinine Clearance: 114.2 mL/min (by C-G formula based on SCr of 0.73 mg/dL). Liver Function Tests: Recent Labs  Lab 07/28/20 1303 07/28/20 2128 07/29/20 0419 07/30/20 0623 07/31/20 1454  AST 98*  --  78* 62* 59*  ALT 41  --  38 38 47*  ALKPHOS 190*  --  139* 114 125  BILITOT 2.4*  --  0.9 0.5 0.4  PROT  8.2*  --  6.7 6.1* 7.4  ALBUMIN 2.4* 2.8* 1.9* 1.9* 1.9*   No results for input(s): LIPASE, AMYLASE in the last 168 hours. No results for input(s): AMMONIA in the last 168 hours. Coagulation Profile: No results for input(s): INR, PROTIME in the last 168 hours. Cardiac Enzymes: Recent Labs  Lab 07/28/20 1303  CKTOTAL 122   BNP (last 3 results) No results for input(s): PROBNP in the last 8760 hours. HbA1C: No results for input(s): HGBA1C in the last 72 hours. CBG: Recent Labs  Lab 07/31/20 1106  GLUCAP 105*   Lipid Profile: No results for input(s): CHOL, HDL, LDLCALC, TRIG, CHOLHDL, LDLDIRECT in the last 72 hours. Thyroid Function Tests: No results for input(s): TSH, T4TOTAL, FREET4, T3FREE, THYROIDAB in the last 72 hours. Anemia Panel: No results for input(s): VITAMINB12, FOLATE, FERRITIN, TIBC, IRON, RETICCTPCT in the last 72 hours.    Radiology Studies: I have reviewed all of the imaging during this hospital visit personally     Scheduled Meds:  bictegravir-emtricitabine-tenofovir AF  1 tablet Oral Daily   enoxaparin (LOVENOX) injection  40 mg Subcutaneous Q24H   feeding supplement (ENSURE ENLIVE)  237 mL Oral TID BM   gabapentin  600 mg Oral TID   multivitamin with minerals  1 tablet Oral Daily   sulfamethoxazole-trimethoprim  1 tablet Oral Daily   Continuous Infusions:  penicillin g continuous IV infusion 6 Million Units (08/04/20 0358)     LOS: 7 days        Oshen Wlodarczyk Annett Gula, MD
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
## 2020-08-04 NOTE — Progress Notes (Signed)
NIF -40 

## 2020-08-04 NOTE — Plan of Care (Signed)
Progressing, will continue to monitor.  

## 2020-08-05 DIAGNOSIS — Z8619 Personal history of other infectious and parasitic diseases: Secondary | ICD-10-CM | POA: Diagnosis not present

## 2020-08-05 DIAGNOSIS — B20 Human immunodeficiency virus [HIV] disease: Secondary | ICD-10-CM | POA: Diagnosis not present

## 2020-08-05 LAB — CK: Total CK: 49 U/L (ref 49–397)

## 2020-08-05 LAB — BASIC METABOLIC PANEL
Anion gap: 6 (ref 5–15)
BUN: 11 mg/dL (ref 6–20)
CO2: 25 mmol/L (ref 22–32)
Calcium: 9.3 mg/dL (ref 8.9–10.3)
Chloride: 102 mmol/L (ref 98–111)
Creatinine, Ser: 0.82 mg/dL (ref 0.61–1.24)
GFR, Estimated: >60 mL/min (ref >60)
Glucose, Bld: 131 mg/dL — ABNORMAL HIGH (ref 70–99)
Potassium: 4 mmol/L (ref 3.5–5.1)
Sodium: 133 mmol/L — ABNORMAL LOW (ref 135–145)

## 2020-08-05 MED ORDER — GABAPENTIN 300 MG PO CAPS
900.0000 mg | ORAL_CAPSULE | Freq: Three times a day (TID) | ORAL | Status: DC
  Administered 2020-08-05 – 2020-08-18 (×39): 900 mg via ORAL
  Filled 2020-08-05 (×39): qty 3

## 2020-08-05 MED ORDER — METHOCARBAMOL 500 MG PO TABS
500.0000 mg | ORAL_TABLET | Freq: Three times a day (TID) | ORAL | Status: DC
  Administered 2020-08-05 – 2020-08-18 (×39): 500 mg via ORAL
  Filled 2020-08-05 (×38): qty 1

## 2020-08-05 NOTE — Progress Notes (Signed)
Patient ambulated in hallway with nursing staff. Back in room will monitor Travis Mcgee, [name] An RN

## 2020-08-05 NOTE — Progress Notes (Signed)
NIF -40 

## 2020-08-05 NOTE — Progress Notes (Signed)
PROGRESS NOTE    Travis Mcgee  NTI:[id number] DOB: 1980-11-18 DOA: 07/28/2020 PCP: Patient, No Pcp Per    Brief Narrative:  Patient admitted to the hospital with working diagnosis of [name] syndrome.  39 year old male with significant past medical history for HIV/AIDSand latent syphiliswho presented with bilateral extremity pain. Positive pain for the last 7 to 10 days, associated with paresthesias and weakness. Progressive symptoms with multiple falls. On his initial physical examination blood pressure 118/86, heart rate 110, respiratory rate 36, oxygen saturation 100%. His lungs are clear to auscultation, heart S1-S2, present rhythmic, soft abdomen, lower extremity edema. His strength was preserved upper extremities, he had a slow gait.  Patient underwent further work-up with CT head of brain MRI which were negative. MRI of the cervical thoracic lumbar spine was unremarkable. Lumbar puncture showed negative culture, negative cryptococcus antigen and VDRL negative. He had elevated CSF IgG/albumin ratio consistent with intrathecal antibiotic production.  Neurology was consulted with recommendations to administer IVIG for 5 days.  Patient continue to have persistent lower extremity weakness and painful paresthesias.Ck 122 on 07/28/20.  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     Assessment & Plan:   Principal Problem:   Guillain Barr syndrome (HCC) Active Problems:   AIDS (acquired immune deficiency syndrome) (HCC)   History of syphilis   Lactic acidosis    1. 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 Sp  IV IG #5/5, his strength has improved only mildly he continue to need help for mobility, he is not yet back to baseline, persistent painful paresthesias and difficulty ambulating.   Increase gabapentin to 900 tid, continue with tramadol, and change cyclobenzarpine for methocarbamol.  Working with PT and OT.  Continue to encourage out  of bed to chair tid with meals.  Plan to continue with penicillin per ID recommendations for 10 days.    NiF has been appropriate, will discontinue measurements.   2. HIV/ AIDS.Continue withantiretroviral therapy and prophylacticbactrim. On nutritional supplements.  3. Chronic disease anemia.Stable cell count.  4. Hyponatremia, hypomagenesemia and hypokalemia. Renal function and electrolytes are stable, cr at 0,82 and K at 4,0 with bicarbonate at 25.    Status is: Inpatient  Remains inpatient appropriate because:Unsafe d/c plan   Dispo:  Patient From: Home  Planned Disposition: Home  Expected discharge date: 08/07/20  Medically stable for discharge: No Pending final recommendations from neurology and PT/OT    DVT prophylaxis:  enoxaparin   Code Status:   full  Family Communication:  No family at the bedside      Nutrition Status: Nutrition Problem: Increased nutrient needs Etiology: acute illness Signs/Symptoms: estimated needs Interventions: Ensure Enlive (each supplement provides 350kcal and 20 grams of protein), MVI       Consultants:   Neurology   ID     Subjective: Patient continue to have painful paresthesias at the lower extremities, positive tender muscle internal thighs bilaterally, no dyspnea, no nausea or vomiting.   Objective: Vitals:   08/05/20 0032 08/05/20 0351 08/05/20 0807 08/05/20 1238  BP: 123/70 102/67 110/74 106/69  Pulse: 82 83 96 99  Resp: 16 14 18 17   Temp: 98.3 F (36.8 C) 97.7 F (36.5 C) (!) 97.4 F (36.3 C) 97.9 F (36.6 C)  TempSrc: Oral Oral Oral Oral  SpO2: 96% 93% 97% 96%  Weight:      Height:        Intake/Output Summary (Last 24 hours) at 08/05/2020 1434 Last data filed at 08/05/2020 1300 Gross  per 24 hour  Intake 1417.8 ml  Output 1200 ml  Net 217.8 ml   Filed Weights   07/28/20 1352  Weight: 65.8 kg    Examination:   General: Not in pain or dyspnea, deconditioned  Neurology: Awake and  alert, lower extremities, 3/5 bilaterally E ENT: no pallor, no icterus, oral mucosa moist Cardiovascular: No JVD. S1-S2 present, rhythmic, no gallops, rubs, or murmurs. No lower extremity edema. Pulmonary: vesicular breath sounds bilaterally, adequate air movement, no wheezing, rhonchi or rales. Gastrointestinal. Abdomen soft and non tender Skin. No rashes Musculoskeletal: no joint deformities/ tender to palpation inner thighs bilaterally.      Data Reviewed: I have personally reviewed following labs and imaging studies  CBC: Recent Labs  Lab 07/30/20 0623 07/30/20 1055 07/31/20 1454 08/01/20 0440  WBC 3.3*  --  3.9* 4.0  NEUTROABS 1.2*  --   --   --   HGB 7.6* 10.2* 10.0* 9.6*  HCT 22.3* 29.9* 30.2* 29.7*  MCV 106.2*  --  104.1* 106.8*  PLT 128*  --  202 213   Basic Metabolic Panel: Recent Labs  Lab 07/30/20 0623 07/31/20 1454 08/01/20 0440 08/01/20 1230 08/02/20 0207 08/03/20 0049 08/05/20 0127  NA 135  --  133*  --  132* 133* 133*  K 3.7  --  4.5  --  4.7 4.5 4.0  CL 109  --  101  --  104 103 102  CO2 19*  --  26  --  23 23 25   GLUCOSE 84  --  81  --  102* 112* 131*  BUN <5*  --  <5*  --  5* 7 11  CREATININE 0.51*  --  0.70  --  0.64 0.73 0.82  CALCIUM 7.6*  --  8.6*  --  8.5* 8.8* 9.3  MG 1.6* 1.6*  --  1.7 1.9  --   --   PHOS 3.5  --   --   --   --   --   --    GFR: Estimated Creatinine Clearance: 111.4 mL/min (by C-G formula based on SCr of 0.82 mg/dL). Liver Function Tests: Recent Labs  Lab 07/30/20 0623 07/31/20 1454  AST 62* 59*  ALT 38 47*  ALKPHOS 114 125  BILITOT 0.5 0.4  PROT 6.1* 7.4  ALBUMIN 1.9* 1.9*   No results for input(s): LIPASE, AMYLASE in the last 168 hours. No results for input(s): AMMONIA in the last 168 hours. Coagulation Profile: No results for input(s): INR, PROTIME in the last 168 hours. Cardiac Enzymes: Recent Labs  Lab 08/05/20 0127  CKTOTAL 49   BNP (last 3 results) No results for input(s): PROBNP in the last  8760 hours. HbA1C: No results for input(s): HGBA1C in the last 72 hours. CBG: Recent Labs  Lab 07/31/20 1106  GLUCAP 105*   Lipid Profile: No results for input(s): CHOL, HDL, LDLCALC, TRIG, CHOLHDL, LDLDIRECT in the last 72 hours. Thyroid Function Tests: No results for input(s): TSH, T4TOTAL, FREET4, T3FREE, THYROIDAB in the last 72 hours. Anemia Panel: No results for input(s): VITAMINB12, FOLATE, FERRITIN, TIBC, IRON, RETICCTPCT in the last 72 hours.    Radiology Studies: I have reviewed all of the imaging during this hospital visit personally     Scheduled Meds: . bictegravir-emtricitabine-tenofovir AF  1 tablet Oral Daily  . cyclobenzaprine  5 mg Oral TID  . enoxaparin (LOVENOX) injection  40 mg Subcutaneous Q24H  . feeding supplement (ENSURE ENLIVE)  237 mL Oral TID BM  .  gabapentin  600 mg Oral TID  . multivitamin with minerals  1 tablet Oral Daily  . sulfamethoxazole-trimethoprim  1 tablet Oral Daily   Continuous Infusions: . penicillin g continuous IV infusion 6 Million Units (08/05/20 0351)     LOS: 8 days        Travis Standre Annett Gula, MD

## 2020-08-06 ENCOUNTER — Other Ambulatory Visit: Payer: Non-veteran care

## 2020-08-06 DIAGNOSIS — B20 Human immunodeficiency virus [HIV] disease: Secondary | ICD-10-CM | POA: Diagnosis not present

## 2020-08-06 DIAGNOSIS — G610 Guillain-Barre syndrome: Secondary | ICD-10-CM | POA: Diagnosis not present

## 2020-08-06 DIAGNOSIS — Z87828 Personal history of other (healed) physical injury and trauma: Secondary | ICD-10-CM | POA: Diagnosis not present

## 2020-08-06 DIAGNOSIS — Z8619 Personal history of other infectious and parasitic diseases: Secondary | ICD-10-CM | POA: Diagnosis not present

## 2020-08-06 DIAGNOSIS — R29898 Other symptoms and signs involving the musculoskeletal system: Secondary | ICD-10-CM | POA: Diagnosis not present

## 2020-08-06 MED ORDER — THIAMINE HCL 100 MG/ML IJ SOLN
100.0000 mg | Freq: Every day | INTRAMUSCULAR | Status: DC
  Filled 2020-08-06 (×3): qty 2

## 2020-08-06 MED ORDER — THIAMINE HCL 100 MG PO TABS
100.0000 mg | ORAL_TABLET | Freq: Every day | ORAL | Status: DC
  Administered 2020-08-06 – 2020-08-18 (×13): 100 mg via ORAL
  Filled 2020-08-06 (×13): qty 1

## 2020-08-06 NOTE — Progress Notes (Signed)
Physical Therapy Treatment Patient Details Name: Travis Mcgee MRN: [id number] DOB: 23-Apr-1981 Today's Date: 08/06/2020    History of Present Illness Travis Mcgee is a 39 y.o. male with medical history significant of HIV/AIDS had been on HAART.Pt presents to ED with c/o pain and weakness in BLE.  Thought to be secondary to Guillain barre and was started on IVIG.     PT Comments    Pt supine in bed on arrival.  He is easy to rouse and eager to mobilize.  Pt required min guard for gt otherwise supervision.  He lack B heel strike during gt training and required cues to correct.  Post gt training focused on LE strengthening in standing.  Continue to recommend snf to improve balance and strength before returning home.     Follow Up Recommendations  SNF     Equipment Recommendations  None recommended by PT    Recommendations for Other Services       Precautions / Restrictions Precautions Precautions: Fall Precaution Comments: Has had multiple falls.  Restrictions Weight Bearing Restrictions: No    Mobility  Bed Mobility Overal bed mobility: Independent                Transfers Overall transfer level: Needs assistance Equipment used: None Transfers: Sit to/from Stand Sit to Stand: Supervision         General transfer comment: Supervision for safety.  Once in standing holding to IV pole for support.  Ambulation/Gait Ambulation/Gait assistance: Supervision Gait Distance (Feet): 200 Feet Assistive device: IV Pole Gait Pattern/deviations: Step-through pattern;Decreased stride length;Trunk flexed;Decreased dorsiflexion - left;Decreased weight shift to right Gait velocity: Decreased   General Gait Details: Cues for heel strike and he presents with foot flat Bilaterally.  Pt required cues for pacing and he continues to rely on holding IV pole.  May trial cane next session.   Stairs             Wheelchair Mobility    Modified Rankin (Stroke Patients Only)        Balance Overall balance assessment: Needs assistance Sitting-balance support: No upper extremity supported Sitting balance-Leahy Scale: Good     Standing balance support: During functional activity;Single extremity supported Standing balance-Leahy Scale: Fair Standing balance comment: can maintain static standing without UE support, uses IV pole as support when ambulating                            Cognition Arousal/Alertness: Awake/alert Behavior During Therapy: WFL for tasks assessed/performed Overall Cognitive Status: No family/caregiver present to determine baseline cognitive functioning                                 General Comments: Pt with decreased safety awareness and decreased awareness of deficits.       Exercises General Exercises - Lower Extremity Hip ABduction/ADduction: AROM;Both;10 reps;Standing Hip Flexion/Marching: AROM;Both;10 reps;Standing Heel Raises: AROM;Both;10 reps;Standing Mini-Sqauts: AROM;Both;10 reps;Standing    General Comments        Pertinent Vitals/Pain Pain Assessment: Faces Faces Pain Scale: Hurts a little bit Pain Location: BLE  Pain Descriptors / Indicators: Pins and needles;Tingling Pain Intervention(s): Monitored during session;Repositioned    Home Living                      Prior Function            PT Goals (  current goals can now be found in the care plan section) Acute Rehab PT Goals Patient Stated Goal: to get back to work PT Goal Formulation: With patient Potential to Achieve Goals: Fair Progress towards PT goals: Progressing toward goals    Frequency    Min 2X/week      PT Plan Current plan remains appropriate    Co-evaluation              AM-PAC PT "6 Clicks" Mobility   Outcome Measure  Help needed turning from your back to your side while in a flat bed without using bedrails?: None Help needed moving from lying on your back to sitting on the side of a  flat bed without using bedrails?: None Help needed moving to and from a bed to a chair (including a wheelchair)?: A Little Help needed standing up from a chair using your arms (e.g., wheelchair or bedside chair)?: A Little Help needed to walk in hospital room?: A Little   6 Click Score: 17    End of Session Equipment Utilized During Treatment: Gait belt Activity Tolerance: Patient limited by pain Patient left: in bed;with call bell/phone within reach Nurse Communication: Mobility status PT Visit Diagnosis: Unsteadiness on feet (R26.81);Muscle weakness (generalized) (M62.81)     Time: 1205-1229 PT Time Calculation (min) (ACUTE ONLY): 24 min  Charges:  $Gait Training: 8-22 mins $Therapeutic Exercise: 8-22 mins                     [name] , PTA Acute Rehabilitation Services Pager [phone] Office [phone]     Travis Mcgee Delay 08/06/2020, 1:14 PM

## 2020-08-06 NOTE — Progress Notes (Signed)
PROGRESS NOTE    Travis Mcgee  QQP:[id number] DOB: 1981-05-10 DOA: 07/28/2020 PCP: Patient, No Pcp Per    Brief Narrative:  Patient admitted to the hospital with working diagnosis of [name] syndrome.  39 year old male with significant past medical history for HIV/AIDSand latent syphiliswho presented with bilateral extremity pain. Positive pain for the last 7 to 10 days, associated with paresthesias and weakness. Progressive symptoms with multiple falls. On his initial physical examination blood pressure 118/86, heart rate 110, respiratory rate 36, oxygen saturation 100%. His lungs are clear to auscultation, heart S1-S2, present rhythmic, soft abdomen, lower extremity edema. His strength was preserved upper extremities, he had a slow gait.  Patient underwent further work-up with CT head of brain MRI which were negative. MRI of the cervical thoracic lumbar spine was unremarkable. Lumbar puncture showed negative culture, negative cryptococcus antigen and VDRL negative. He had elevated CSF IgG/albumin ratio consistent with intrathecal antibiotic production.  Neurology was consulted with recommendations to administer IVIG for 5 days.  Patient continue to have persistent lower extremity weakness and painful paresthesias.Ck 122 on 07/28/20.  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 He continue to have persistent painful paresthesias and difficulty ambulating. He is able to stand and walk slowly, need to hold on IV pole for safety.   Continue with gabapentin to 900 tid, tramadol, and methocarbamol. Plan to transition to SNF when workup completed to continue physical therapy.  He will complete treatment  for neurosyphilis with penicillin.   Noted elevated borrelia antibodies IgM+ IgG, patient with no recent tick exposure. He has no fevers. Will check with ID, but likely not indicating acute disease.  Continue neuro work up with B1 levels, serum ACE and heavy metal screen.   2. HIV/ AIDS.On antiretroviral therapyandprophylacticbactrim. Contineu with nutritional supplements.  3. Chronic disease anemia.Stable cell count.  4. Hyponatremia, hypomagenesemia and hypokalemia. Patient is tolerating po well.    Status is: Inpatient  Remains inpatient appropriate because:IV treatments appropriate due to intensity of illness or inability to take PO   Dispo:  Patient From: Home  Planned Disposition: Skilled Nursing Facility  Expected discharge date: 08/07/20  Medically stable for discharge: No        DVT prophylaxis: Enoxaparin   Code Status:   full  Family Communication:  No family at the bedside      Nutrition Status: Nutrition Problem: Increased nutrient needs Etiology: acute illness Signs/Symptoms: estimated needs Interventions: Ensure Enlive (each supplement provides 350kcal and 20 grams of protein), MVI      Consultants:   ID  Neurology      Antimicrobials:   Penicillin     Subjective: Patient continue to have paresthesias with mild improvement, he has been able to get out of bed and ambulate slowly. No nausea or vomiting, no chest pain,   Objective: Vitals:   08/05/20 2341 08/06/20 0519 08/06/20 0745 08/06/20 1207  BP: 111/81 (!) 111/95 106/70 113/66  Pulse: 90 84 (!) 110   Resp: 18 16 18    Temp: 97.8 F (36.6 C) 97.6 F (36.4 C) 97.6 F (36.4 C) 97.6 F (36.4 C)  TempSrc: Oral Oral Oral Oral  SpO2: 96% 98% 95% 94%  Weight:      Height:  Intake/Output Summary (Last 24 hours) at 08/06/2020 1443 Last data filed at 08/06/2020 0746 Gross per 24 hour  Intake 721.18 ml  Output 1450 ml  Net -728.82 ml   Filed Weights    07/28/20 1352  Weight: 65.8 kg    Examination:   General: Not in pain or dyspnea, deconditioned  Neurology: Awake and alert, strength 4/5 lower extremities. Tender to palpation bilateral thighs.  E ENT: no pallor, no icterus, oral mucosa moist Cardiovascular: No JVD. S1-S2 present, rhythmic, no gallops, rubs, or murmurs. No lower extremity edema. Pulmonary: vesicular breath sounds bilaterally, adequate air movement, no wheezing, rhonchi or rales. Gastrointestinal. Abdomen soft and non tender Skin. No rashes Musculoskeletal: no joint deformities     Data Reviewed: I have personally reviewed following labs and imaging studies  CBC: Recent Labs  Lab 07/31/20 1454 08/01/20 0440  WBC 3.9* 4.0  HGB 10.0* 9.6*  HCT 30.2* 29.7*  MCV 104.1* 106.8*  PLT 202 213   Basic Metabolic Panel: Recent Labs  Lab 07/31/20 1454 08/01/20 0440 08/01/20 1230 08/02/20 0207 08/03/20 0049 08/05/20 0127  NA  --  133*  --  132* 133* 133*  K  --  4.5  --  4.7 4.5 4.0  CL  --  101  --  104 103 102  CO2  --  26  --  23 23 25   GLUCOSE  --  81  --  102* 112* 131*  BUN  --  <5*  --  5* 7 11  CREATININE  --  0.70  --  0.64 0.73 0.82  CALCIUM  --  8.6*  --  8.5* 8.8* 9.3  MG 1.6*  --  1.7 1.9  --   --    GFR: Estimated Creatinine Clearance: 111.4 mL/min (by C-G formula based on SCr of 0.82 mg/dL). Liver Function Tests: Recent Labs  Lab 07/31/20 1454  AST 59*  ALT 47*  ALKPHOS 125  BILITOT 0.4  PROT 7.4  ALBUMIN 1.9*   No results for input(s): LIPASE, AMYLASE in the last 168 hours. No results for input(s): AMMONIA in the last 168 hours. Coagulation Profile: No results for input(s): INR, PROTIME in the last 168 hours. Cardiac Enzymes: Recent Labs  Lab 08/05/20 0127  CKTOTAL 49   BNP (last 3 results) No results for input(s): PROBNP in the last 8760 hours. HbA1C: No results for input(s): HGBA1C in the last 72 hours. CBG: Recent Labs  Lab 07/31/20 1106  GLUCAP 105*   Lipid  Profile: No results for input(s): CHOL, HDL, LDLCALC, TRIG, CHOLHDL, LDLDIRECT in the last 72 hours. Thyroid Function Tests: No results for input(s): TSH, T4TOTAL, FREET4, T3FREE, THYROIDAB in the last 72 hours. Anemia Panel: No results for input(s): VITAMINB12, FOLATE, FERRITIN, TIBC, IRON, RETICCTPCT in the last 72 hours.    Radiology Studies: I have reviewed all of the imaging during this hospital visit personally     Scheduled Meds: . bictegravir-emtricitabine-tenofovir AF  1 tablet Oral Daily  . enoxaparin (LOVENOX) injection  40 mg Subcutaneous Q24H  . feeding supplement (ENSURE ENLIVE)  237 mL Oral TID BM  . gabapentin  900 mg Oral TID  . methocarbamol  500 mg Oral TID  . multivitamin with minerals  1 tablet Oral Daily  . sulfamethoxazole-trimethoprim  1 tablet Oral Daily  . thiamine injection  100 mg Intravenous Daily   Or  . thiamine  100 mg Oral Daily   Continuous Infusions: . penicillin g continuous IV infusion 6 Million Units (08/06/20  1027)     LOS: 9 days        Martine Bleecker Annett Gula, MD

## 2020-08-06 NOTE — Progress Notes (Signed)
NEUROLOGY CONSULTATION PROGRESS NOTE   Date of service: August 06, 2020 Patient Name: Travis Mcgee MRN:  [id number] DOB:  November 05, 1980  Brief HPI  Travis Mcgee is a 39 y.o. male with hx of HIV p/w ascending weakness, pain and sensory loss over 2 weeks. No symptoms of cord compression, no preceding URI or GI infection. Noted to have some hoarseness but no other respiratory symptoms. Workup with MRI entire neuro axis with and without contrast did not reveal an etiology. LP with no pleocytosis, no elevated protein, olgiclonal bands are negative, IgG index was positive however. CK negative. He was started on IVIG 0.4g/Kg daily x 5 doses with worsening of symptoms despite IVIG. Tingling and sensitivity increased to the level of umblicus. 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 He was given additional Penicillin treatment considered recent sexual activity.   Interval Hx   Reports that hyperalgesia with paresthesias are the most worrisome of his symptoms. He feels that if his sensory symptoms were gone, he would be much stronger. Pain is a significant reason why he is weak. He feels that his paresthesias/hyperasthesias abruptly stop at the level of the xiphoid process and does not extend above it. His symptoms have been gradually getting worse despite IVIG and medications. Medications have been somewhat helpful at holding his pain back a little bit.  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 Does endorse exposure to a lot of dust and debris for 3 weeks before symptom onset. Denies significant amount of time outdoors where he could be exposed to ticks. Denies any abdominal pain, respiratory symptoms prior to onset of his current symptoms. He is adopted so does not know anything about his family history. He eats a good mix of meat, vegetables and dairy. Drinks  alcohol occasionally.  Vitals   Vitals:   08/05/20 1606 08/05/20 1942 08/05/20 2341 08/06/20 0519  BP: 111/76 115/70 111/81 (!) 111/95  Pulse: (!) 105 94 90 84  Resp: 16 18 18 16   Temp: 98 F (36.7 C) 97.6 F (36.4 C) 97.8 F (36.6 C) 97.6 F (36.4 C)  TempSrc: Oral Oral Oral Oral  SpO2: 96% 98% 96% 98%  Weight:      Height:         Body mass index is 20.22 kg/m.  Physical Exam   General: Laying comfortably in bed; in no acute distress. HENT: Normal oropharynx and mucosa. Normal external appearance of ears and nose. Neck: Supple, no pain or tenderness  CV: No JVD. No peripheral edema.  Pulmonary: Symmetric Chest rise. Normal respiratory effort.  Abdomen: Soft to touch, non-tender.  Ext: No cyanosis, edema, or deformity  Skin: No rash. Normal palpation of skin.   Musculoskeletal: Normal digits and nails by inspection. No clubbing.   Neurologic Examination  Mental status/Cognition: Alert, oriented to self, place, month and year, good attention. Speech/language: High pitched, fluent, comprehension intact, object naming intact, repetition intact. Cranial nerves:   CN II Pupils equal and reactive to light, no VF deficits   CN III,IV,VI EOM intact, no gaze preference or deviation, no nystagmus   CN V normal sensation in V1, V2, and V3 segments bilaterally   CN VII no asymmetry, no nasolabial fold flattening   CN VIII normal hearing to speech   CN IX & X normal palatal elevation, no uvular deviation   CN XI 5/5 head turn  and 5/5 shoulder shrug bilaterally   CN XII midline tongue protrusion   Motor:  Muscle bulk: normal, tone normal, pronator drift none tremor none Mvmt Root Nerve  Muscle Right Left Comments  SA C5/6 Ax Deltoid 5 5   EF C5/6 Mc Biceps 5 5   EE C6/7/8 Rad Triceps 5 5   WF C6/7 Med FCR 5 5   WE C7/8 PIN ECU 5 5   F Ab C8/T1 U ADM/FDI 5 5   HF L1/2/3 Fem Illopsoas 4 4   KE L2/3/4 Fem Quad 4 4   DF L4/5 D Peron Tib Ant 3 3   PF S1/2 Tibial Grc/Sol 5 5     Reflexes:  Right Left Comments  Pectoralis      Biceps (C5/6) 1 1   Brachioradialis (C5/6) 2 2    Triceps (C6/7) 1 1    Patellar (L3/4) 0 0    Achilles (S1) 0 0    Hoffman - -    Plantar mute mute   Jaw jerk    Sensation:  Light touch Allodynia in BL legs and hips   Pin prick    Temperature Decreased in BL legs.   Vibration   Proprioception    Coordination/Complex Motor:  - Finger to Nose intact BL - Heel to shin unable to do - Rapid alternating movement are normal - Gait: Deferred 2/2 significat pain, reports requiring assistance to walk.  Labs   Basic Metabolic Panel:  Lab Results  Component Value Date   NA 133 (L) 08/05/2020   K 4.0 08/05/2020   CO2 25 08/05/2020   GLUCOSE 131 (H) 08/05/2020   BUN 11 08/05/2020   CREATININE 0.82 08/05/2020   CALCIUM 9.3 08/05/2020   GFRNONAA >60 08/05/2020   GFRAA >60 07/30/2020   HbA1c: No results found for: HGBA1C LDL: No results found for: [facility] Urine Drug Screen:     Component Value Date/Time   LABOPIA NONE DETECTED 07/28/2020 1303   COCAINSCRNUR NONE DETECTED 07/28/2020 1303   LABBENZ NONE DETECTED 07/28/2020 1303   AMPHETMU NONE DETECTED 07/28/2020 1303   THCU NONE DETECTED 07/28/2020 1303   LABBARB NONE DETECTED 07/28/2020 1303    Alcohol Level No results found for: ETH No results found for: PHENYTOIN, ZONISAMIDE, LAMOTRIGINE, LEVETIRACETA No results found for: PHENYTOIN, PHENOBARB, VALPROATE, CBMZ  Imaging and Diagnostic studies  Results for orders placed during the hospital encounter of 07/28/20  MR BRAIN WO CONTRAST Normal brain MRI.  MR Cervical, thoracic and Lumbar spine with and without contrast: Normal MRI of the cervical, thoracic and lumbar spine.   Impression   Travis Mcgee is a 39 y.o. male with PMH significant for HIV, syphilis, p/w ascending weakness, pain and sensory loss. Initially concerning for GBS with absent reflexes in BL lower extremities and elevated IgG index. Symptoms have  been worsening despite IVIG x 5 days and now have been ongoing for more than 3 weeks, althou feels like the rate of worsening is more slower. HIV and syphilis titers are improved and combined with LP findings, low suspicion for acute polyradiculoneuritis secondary to HIV. He did get an additional dose of penicilling on 10/4 given recent sexual activity.  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  Another consideration is that this might be headed towards CIDP territory given worsening of his symptoms for more than  3 weeks. GBS typically hits a nadir around 3 weeks, followed by gradual improvement over months.  Acute intermittent Porphyria can also cause polyneuropathy, it has variable penetrance and can be triggered by environmental factors.  Vitamin B1 deficiency can also present with polyneuropathy. Will get levels and start replacement with 100mg  daily for now.  I may consider repeat MRIs of his spine if he continues to get worse.  I dont thinks that getting protein electrophoresis at this time or paraneoplastic panel is going to be useful as it will be confounded by IVIG. Maybe a good idea to get this ina few weeks.  Recommendations  - I ordered Vit B1 levels, serum ACE, Porphyrins fractionated, Heavy metal screen. - He is on Gabapentin 900mg  TID, Can add Duloxetine for neuropathic pain if needed. - Will consider repeat MRIs of his spine if he continues to get worse. - Recommend getting serum paraneoplastic panel, sensory neuropathy panel and serum Protein electrophoresis outpatient. These can be confounded by Antibodies present in IVIG that was recently given. ______________________________________________________________________   Thank you for the opportunity to take part in the care of this patient. If you have any further questions, please  contact the neurology consultation attending.  Signed,  [facility] Pager Number 

## 2020-08-06 NOTE — Progress Notes (Signed)
[facility]  Date of Admission:  07/28/2020      Total days of antibiotics 4 Penicillin           ASSESSMENT: Travis Mcgee is a 39 y.o. male with AIDS (VL on admission 23,000, CD4 90) here for ascending weakness/tingling of lower extremities. Based on benign LP findings and adequate drop in peripheral titer, syphilis seems to be very unlikely the cause of his current problem. ?if his HIV viremia may be responsible, although not terribly high.  Neurology following and planning heavy metal screen given work outside recently.  Considering repeat MRIs, does not feel plasma exchange will be helpful at this point. Also paraneoplastic panel results will be impacted by recent IVIG.   Regarding coordinating his future HIV care - will see if we can get our bridge counselor, Mitch to meet with the patient given his history of no-show appointments in the past and resource difficulties to eventually bridge him to [facility] case managers. Will see if our financial team can also evaluate him for RW / ADAP ability to supplement VA coverage - he has barriers getting meds from [place] due to transportation. Will plan to get him 30-days to biktarvy and bactrim prior to his discharge.     PLAN: 1. Continue current treatment with 10-day course IV penicillin, biktarvy and bactrim  2. Further plans for neurology pending further work up, appreciate recommendations  3. Will work on access to HIV meds / resources for outpatient care planning.      Principal Problem:   Guillain Barr syndrome [facility]) Active Problems:   AIDS (acquired immune deficiency syndrome) (HCC)   History of syphilis   Lactic acidosis   . bictegravir-emtricitabine-tenofovir AF  1 tablet Oral Daily  . enoxaparin (LOVENOX) injection  40 mg Subcutaneous Q24H  . feeding supplement (ENSURE ENLIVE)  237 mL Oral TID BM  . gabapentin  900 mg Oral TID  . methocarbamol  500 mg Oral TID  . multivitamin with minerals   1 tablet Oral Daily  . sulfamethoxazole-trimethoprim  1 tablet Oral Daily  . thiamine injection  100 mg Intravenous Daily   Or  . thiamine  100 mg Oral Daily    SUBJECTIVE: Walking in the room upon arrival. Reports that he has not had much significant improvement and is worried about going home without help (asking about PT and possible walking aid). He continues to have tingling numbness despite 5 doses of IVIG and PCN infusion.   Apparently he is living in someone's shed in the back yard. He used to walk to HIV appointments but doubts he will be able to at this time. 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 No respiratory symptoms.    Review of Systems: Review of Systems  Constitutional: Negative for chills and fever.  HENT: Negative for tinnitus.   Eyes: Negative for blurred vision and photophobia.  Respiratory: Negative for cough and sputum production.   Cardiovascular: Negative for chest pain.  Gastrointestinal: Negative for diarrhea, nausea and vomiting.  Genitourinary: Negative for dysuria.  Skin: Negative for rash.  Neurological: Negative for headaches.    No Known Allergies  OBJECTIVE: Vitals:   08/05/20 1942 08/05/20 2341 08/06/20 0519 08/06/20 0745  BP: 115/70 111/81 (!) 111/95 106/70  Pulse: 94 90 84 (!) 110  Resp: 18 18 16 18   Temp: 97.6 F (36.4 C) 97.8 F (36.6 C) 97.6 F (36.4 C) 97.6 F (36.4 C)  TempSrc: Oral Oral Oral Oral  SpO2: 98% 96% 98% 95%  Weight:      Height:       Body mass index is 20.22 kg/m.  Physical Exam Constitutional:      Appearance: He is not ill-appearing.  Eyes:     General: No scleral icterus.    Pupils: Pupils are equal, round, and reactive to light.  Cardiovascular:     Rate and Rhythm: Normal rate and regular rhythm.  Pulmonary:     Effort: Pulmonary effort is normal.     Breath sounds: Normal breath sounds.  Abdominal:     General: Bowel sounds are normal.     Palpations: Abdomen is soft.  Skin:     General: Skin is warm and dry.     Capillary Refill: Capillary refill takes less than 2 seconds.  Neurological:     Mental Status: He is alert and oriented to person, place, and time.     Sensory: Sensory deficit present.     Motor: Weakness present.     Gait: Gait normal.     Lab Results Lab Results  Component Value Date   WBC 4.0 08/01/2020   HGB 9.6 (L) 08/01/2020   HCT 29.7 (L) 08/01/2020   MCV 106.8 (H) 08/01/2020   PLT 213 08/01/2020    Lab Results  Component Value Date   CREATININE 0.82 08/05/2020   BUN 11 08/05/2020   NA 133 (L) 08/05/2020   K 4.0 08/05/2020   CL 102 08/05/2020   CO2 25 08/05/2020    Lab Results  Component Value Date   ALT 47 (H) 07/31/2020   AST 59 (H) 07/31/2020   ALKPHOS 125 07/31/2020   BILITOT 0.4 07/31/2020     Microbiology: Recent Results (from the past 240 hour(s))  Respiratory Panel by RT PCR (Flu A&B, Covid) - Nasopharyngeal Swab     Status: None   Collection Time: 07/28/20  1:05 PM   Specimen: Nasopharyngeal Swab  Result Value Ref Range Status   SARS Coronavirus 2 by RT PCR NEGATIVE NEGATIVE Corrected   Influenza A by PCR NEGATIVE NEGATIVE Final   Influenza B by PCR NEGATIVE NEGATIVE Final    Comment: (NOTE) The Xpert Xpress SARS-CoV-2/FLU/RSV assay is intended as an aid in  the diagnosis of influenza from Nasopharyngeal swab specimens and  should not be used as a sole basis for treatment. Nasal washings and  aspirates are unacceptable for Xpert Xpress SARS-CoV-2/FLU/RSV  testing.  Fact Sheet for Patients: https://www.moore.com/  Fact Sheet for Healthcare Providers: https://www.young.biz/  This test is not yet approved or cleared by the [place] FDA and  has been authorized for detection and/or diagnosis of SARS-CoV-2 by  FDA under an Emergency Use Authorization (EUA). This EUA will remain  in effect (meaning this test can be used) for the duration of the  Covid-19 declaration  under Section 564(b)(1) of the Act, 21  U.S.C. section 360bbb-3(b)(1), unless the authorization is  terminated or revoked. Performed at [facility], [address], [place], [place] [address]   Culture, blood (routine x 2)     Status: None   Collection Time: 07/28/20  3:24 PM   Specimen: BLOOD RIGHT HAND  Result Value Ref Range Status   Specimen Description   Final    BLOOD RIGHT HAND Performed at [facility], [address] [place], [place] [address]    Special Requests   Final    BOTTLES DRAWN AEROBIC AND ANAEROBIC  Blood Culture adequate volume Performed at [facility], [address] [place], [place] [address]    Culture   Final    NO GROWTH 5 DAYS Performed at [facility], [address] [place], [place] [address]    Report Status 08/02/2020 FINAL  Final  CSF culture     Status: None   Collection Time: 07/28/20  8:19 PM   Specimen: CSF; Cerebrospinal Fluid  Result Value Ref Range Status   Specimen Description   Final    CSF Performed at [facility], [address] [place], [place] [address]    Special Requests   Final    Immunocompromised Performed at [facility], [address] [place], [place] [address]    Gram Stain   Final    WBC PRESENT, PREDOMINANTLY MONONUCLEAR NO ORGANISMS SEEN CYTOSPIN SMEAR    Culture   Final    NO GROWTH Performed at [facility], [address] [place], [place] [address]    Report Status 08/01/2020 FINAL  Final  Fungus Culture With Stain     Status: None (Preliminary result)   Collection Time: 07/28/20  8:19 PM   Specimen: Lumbar Puncture; Cerebrospinal Fluid  Result Value Ref Range Status   Fungus Stain Final report  Final    Comment: (NOTE) Performed At: [facility] [address] [place], [place] [id number] Jolene SchimkeNagendra Sanjai MD XL:[id number]:[phone]    Fungus (Mycology) Culture PENDING  Incomplete   Fungal  Source CSF  Final    Comment: Performed at [facility], [address] [place], [place] [address]  Fungus Culture Result     Status: None   Collection Time: 07/28/20  8:19 PM  Result Value Ref Range Status   Result 1 Comment  Final    Comment: (NOTE) KOH/Calcofluor preparation:  no fungus observed. Performed At: [facility] [place] [address] [place], [place] [id number] Jolene SchimkeNagendra Sanjai MD GL:[id number]:[phone]   HSV 1/2 Ab IgG/IgM CSF     Status: None   Collection Time: 07/28/20  9:28 PM   Specimen: CSF; Cerebrospinal Fluid  Result Value Ref Range Status   HSV 1/2 Ab, IgM, CSF 0.12 <=0.89 IV Final    Comment: (NOTE) INTERPRETIVE INFORMATION: Herpes Simplex Virus                          Type 1 and/or 2 Antibodies,                          IgM by ELISA, CSF  0.89 IV or Less .......... Negative: No significant                             level of detectable HSV IgM                             antibody.  0.90 - 1.09 IV ........... Equivocal: Questionable                             presence of IgM antibodies.                             Repeat testing in 10-14 days  may be helpful.  1.10 IV or Greater ....... Positive: IgM antibody to HSV                             detected, which may indicate a                             current or recent infection.                             However, low levels of IgM                             antibodies may occasionally                             persist for more than 12                             months post-infection. The detection of antibodies to herpes simplex virus in CSF may indicate central nervou s system infection. However, consideration must be given to possible contamination by blood or transfer of serum antibodies across the blood-brain barrier. Fourfold or greater rise in CSF antibodies to herpes on specimens at least 4 weeks apart are found in 74-94 % of patients with herpes  encephalitis. Specificity of the test based on a single CSF testing is not established. Presently PCR is the primary means of establishing a diagnosis of herpes encephalitis. This test was developed and its performance characteristics determined by [facility]. It has not been cleared or approved by the [place] Food and Drug Administration. This test was performed in a CLIA certified laboratory and is intended for clinical purposes.    HSV 1/2 Ab Screen IgG, CSF 0.69 <=0.89 IV Final    Comment: (NOTE) INTERPRETIVE INFORMATION: Herpes Simplex Virus Type 1 and/or 2                    Antibodies, IgG CSF  0.89 IV or Less .......... Negative: No significant                             level of detectable HSV IgG                             antibody.  0.90 - 1.09 IV ........... Equivocal: Questionable                             presence of IgG antibodies.                             Repeat testing in 10-14 days                             may be helpful.  1.10 IV or Greater ....... Positive: IgG antibody to HSV                             detected,  which may indicate                             a current or past HSV                             infection. The detection of antibodies to herpes simplex virus in CSF may indicate central nervous system infection. However, consideration must be given to possible contamination by blood or transfer of serum antibodies across the blood-brain barrier. Fourfold or greater rise in CSF antibodies to herpes on specimens at l east 4 weeks apart are found in 74-94 % of patients with herpes encephalitis. Specificity of the test based on a single CSF testing is not established. Presently PCR is the primary means of establishing a diagnosis of herpes encephalitis. This test was developed and its performance characteristics determined by [facility]. It has not been cleared or approved by the [place] Food and Drug Administration. This test  was performed in a CLIA certified laboratory and is intended for clinical purposes. Performed At: [facility] [address] [place], [place] [id number] Elinor Parkinson I MD OX:[id number]      Rexene Alberts, MSN, NP-C [facility]  [email or web address] Pager: [phone] Office: [phone] [facility] Main Line: [phone]

## 2020-08-06 NOTE — Progress Notes (Signed)
Occupational Therapy Treatment Patient Details Name: Travis Mcgee MRN: [id number] DOB: 12-23-1980 Today's Date: 08/06/2020    History of present illness Travis Mcgee is a 39 y.o. male with medical history significant of HIV/AIDS had been on HAART.Pt presents to ED with c/o pain and weakness in BLE.  Thought to be secondary to Guillain barre and was started on IVIG.    OT comments  Patient continues to make steady progress towards goals in skilled OT session. Patient's session encompassed toilet transfers and functional mobility in order to progress with activity tolerance and endurance. Pt able to ambulate length of hallway at min guard level, however relies on IV pole and hand rails in his environment for increased stability. Discharge remains appropriate, will continue to follow acutely.    Follow Up Recommendations  Home health OT    Equipment Recommendations       Recommendations for Other Services      Precautions / Restrictions Precautions Precautions: Fall Precaution Comments: Has had multiple falls.  Restrictions Weight Bearing Restrictions: No       Mobility Bed Mobility Overal bed mobility: Independent                Transfers Overall transfer level: Needs assistance Equipment used: None (used IV pole) Transfers: Sit to/from Stand Sit to Stand: Min guard         General transfer comment: Min guard for safety, reaching for hand rails when ambulating in hallway    Balance Overall balance assessment: Needs assistance Sitting-balance support: No upper extremity supported Sitting balance-Leahy Scale: Good     Standing balance support: During functional activity;Single extremity supported Standing balance-Leahy Scale: Fair Standing balance comment: can maintain static standing without UE support, uses IV pole as support when ambulating                           ADL either performed or assessed with clinical judgement   ADL Overall ADL's  : Needs assistance/impaired                         Toilet Transfer: Supervision/safety   Toileting- Clothing Manipulation and Hygiene: Independent       Functional mobility during ADLs: Supervision/safety       Vision       Perception     Praxis      Cognition Arousal/Alertness: Awake/alert Behavior During Therapy: WFL for tasks assessed/performed Overall Cognitive Status: No family/caregiver present to determine baseline cognitive functioning                                 General Comments: Pt with decreased safety awareness and decreased awareness of deficits.         Exercises     Shoulder Instructions       General Comments      Pertinent Vitals/ Pain       Pain Assessment: Faces Faces Pain Scale: Hurts a little bit Pain Location: BLE  Pain Descriptors / Indicators: Pins and needles;Tingling Pain Intervention(s): Limited activity within patient's tolerance;Monitored during session;Repositioned  Home Living                                          Prior Functioning/Environment  Frequency  Min 1X/week        Progress Toward Goals  OT Goals(current goals can now be found in the care plan section)  Progress towards OT goals: Progressing toward goals  Acute Rehab OT Goals Patient Stated Goal: to get back to work OT Goal Formulation: With patient Time For Goal Achievement: 08/16/20 Potential to Achieve Goals: Good  Plan Discharge plan remains appropriate    Co-evaluation                 AM-PAC OT "6 Clicks" Daily Activity     Outcome Measure   Help from another person eating meals?: None Help from another person taking care of personal grooming?: None Help from another person toileting, which includes using toliet, bedpan, or urinal?: None Help from another person bathing (including washing, rinsing, drying)?: A Little Help from another person to put on and taking off  regular upper body clothing?: None Help from another person to put on and taking off regular lower body clothing?: A Little 6 Click Score: 22    End of Session Equipment Utilized During Treatment: Other (comment) (IV pole)  OT Visit Diagnosis: Unsteadiness on feet (R26.81);Muscle weakness (generalized) (M62.81);Pain   Activity Tolerance Patient tolerated treatment well   Patient Left in bed;with call bell/phone within reach (electing to sit on EOB)   Nurse Communication Mobility status        Time: 4174-0814 OT Time Calculation (min): 15 min  Charges: OT General Charges $OT Visit: 1 Visit OT Treatments $Self Care/Home Management : 8-22 mins  Travis Mcgee, COTA/L Acute Rehabilitation Services [phone]   Travis Mcgee 08/06/2020, 1:05 PM

## 2020-08-07 DIAGNOSIS — E440 Moderate protein-calorie malnutrition: Secondary | ICD-10-CM

## 2020-08-07 DIAGNOSIS — E872 Acidosis: Secondary | ICD-10-CM | POA: Diagnosis not present

## 2020-08-07 DIAGNOSIS — B20 Human immunodeficiency virus [HIV] disease: Secondary | ICD-10-CM | POA: Diagnosis not present

## 2020-08-07 DIAGNOSIS — R29898 Other symptoms and signs involving the musculoskeletal system: Secondary | ICD-10-CM | POA: Diagnosis not present

## 2020-08-07 DIAGNOSIS — Z8619 Personal history of other infectious and parasitic diseases: Secondary | ICD-10-CM | POA: Diagnosis not present

## 2020-08-07 DIAGNOSIS — Z87828 Personal history of other (healed) physical injury and trauma: Secondary | ICD-10-CM | POA: Diagnosis not present

## 2020-08-07 DIAGNOSIS — G610 Guillain-Barre syndrome: Secondary | ICD-10-CM | POA: Diagnosis not present

## 2020-08-07 LAB — ANGIOTENSIN CONVERTING ENZYME: Angiotensin-Converting Enzyme: 80 U/L (ref 14–82)

## 2020-08-07 MED ORDER — VITAMIN B-12 1000 MCG PO TABS
1000.0000 ug | ORAL_TABLET | Freq: Every day | ORAL | Status: DC
  Administered 2020-08-07 – 2020-08-18 (×12): 1000 ug via ORAL
  Filled 2020-08-07 (×12): qty 1

## 2020-08-07 MED ORDER — ENSURE ENLIVE PO LIQD
237.0000 mL | ORAL | Status: DC
  Administered 2020-08-08 – 2020-08-17 (×5): 237 mL via ORAL

## 2020-08-07 NOTE — Progress Notes (Signed)
[facility]  Date of Admission:  07/28/2020      Total days of antibiotics 5 Penicillin           ASSESSMENT: Travis Mcgee is a 39 y.o. 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 Will need to keep him on antiretrovirals to keep HIV under good control (unclear if this was the cause of his GB syndrome or alternative). Unlikely related to neurosyphilis as he had no findings c/w that on LP and peripheral titer decreased indicating appropriate response to tx - no harm to continue 10-d course of PCN however given the very small chance it could be contributing.  Lyme combination Ab (+) but reflexed WB is negative - agree with Dr. Ella Mcgee that this is not c/w lyme infection and no indication for treatment.    Referral to bridge counselor made - patient has no phone currently. Will see if Travis Mcgee can reach out while he is in [facility].   Will need SNF at discharge - Travis Mcgee team has been consulted to facilitate. He would very much like a safe place to discharge to.    PLAN: 1. Continue current treatment with 10-day course IV penicillin 2. Ongoing once daily bactrim and biktarvy 3. HIV follow up arranged for 10/28.  4. Referral to bridge counselor placed  5. Further plans for neurology pending further work up, appreciate recommendations  6. No indication for lyme treatment - confirmatory western blot is negative and likely false (+)EIA combination IgM/IgG Ab  7. SNF placement recommended - he has VA benefits so hopeful he can go to facility.   Will sign off but happy to see Travis Mcgee back in the Mc[facility] should there be a change in his condition.    Principal Problem:   Guillain Barr syndrome [facility]) Active Problems:   AIDS (acquired immune deficiency syndrome) (HCC)   History of syphilis   Lactic acidosis   . bictegravir-emtricitabine-tenofovir AF  1 tablet Oral Daily   . enoxaparin (LOVENOX) injection  40 mg Subcutaneous Q24H  . feeding supplement (ENSURE ENLIVE)  237 mL Oral TID BM  . gabapentin  900 mg Oral TID  . methocarbamol  500 mg Oral TID  . multivitamin with minerals  1 tablet Oral Daily  . sulfamethoxazole-trimethoprim  1 tablet Oral Daily  . thiamine injection  100 mg Intravenous Daily   Or  . thiamine  100 mg Oral Daily  . vitamin B-12  1,000 mcg Oral Daily    SUBJECTIVE: No changes today really. Concerned about going home to safe environment.    Review of Systems: Review of Systems  Constitutional: Negative for chills and fever.  HENT: Negative for tinnitus.   Eyes: Negative for blurred vision and photophobia.  Respiratory: Negative for cough and sputum production.   Cardiovascular: Negative for chest pain.  Gastrointestinal: Negative for diarrhea, nausea and vomiting.  Genitourinary: Negative for dysuria.  Skin: Negative for rash.  Neurological: Negative for headaches.    No Known Allergies  OBJECTIVE: Vitals:   08/06/20 2126 08/07/20 0111 08/07/20 0336 08/07/20 0758  BP: 115/74 108/71 101/69 105/85  Pulse: 98 94 92   Resp: Temp: 98 F (36.7 C) 98.2 F (36.8 C) 97.8 F (36.6 C) 98.2 F (36.8 C)  TempSrc: Oral Oral Oral Oral  SpO2: 94% 96% 94% 94%  Weight:      Height:  Body mass index is 20.22 kg/m.  Physical Exam Constitutional:      Appearance: He is not ill-appearing.  Eyes:     General: No scleral icterus.    Pupils: Pupils are equal, round, and reactive to light.  Cardiovascular:     Rate and Rhythm: Normal rate and regular rhythm.  Pulmonary:     Effort: Pulmonary effort is normal.     Breath sounds: Normal breath sounds.  Abdominal:     General: Bowel sounds are normal.     Palpations: Abdomen is soft.  Skin:    General: Skin is warm and dry.     Capillary Refill: Capillary refill takes less than 2 seconds.  Neurological:     Mental Status: He is alert and oriented to  person, place, and time.     Sensory: Sensory deficit present.     Motor: Weakness present.     Gait: Gait normal.     Lab Results Lab Results  Component Value Date   WBC 4.0 08/01/2020   HGB 9.6 (L) 08/01/2020   HCT 29.7 (L) 08/01/2020   MCV 106.8 (H) 08/01/2020   PLT 213 08/01/2020    Lab Results  Component Value Date   CREATININE 0.82 08/05/2020   BUN 11 08/05/2020   NA 133 (L) 08/05/2020   K 4.0 08/05/2020   CL 102 08/05/2020   CO2 25 08/05/2020    Lab Results  Component Value Date   ALT 47 (H) 07/31/2020   AST 59 (H) 07/31/2020   ALKPHOS 125 07/31/2020   BILITOT 0.4 07/31/2020     Microbiology: Recent Results (from the past 240 hour(s))  Respiratory Panel by RT PCR (Flu A&B, Covid) - Nasopharyngeal Swab     Status: None   Collection Time: 07/28/20  1:05 PM   Specimen: Nasopharyngeal Swab  Result Value Ref Range Status   SARS Coronavirus 2 by RT PCR NEGATIVE NEGATIVE Corrected   Influenza A by PCR NEGATIVE NEGATIVE Final   Influenza B by PCR NEGATIVE NEGATIVE Final    Comment: (NOTE) The Xpert Xpress SARS-CoV-2/FLU/RSV assay is intended as an aid in  the diagnosis of influenza from Nasopharyngeal swab specimens and  should not be used as a sole basis for treatment. Nasal washings and  aspirates are unacceptable for Xpert Xpress SARS-CoV-2/FLU/RSV  testing.  Fact Sheet for Patients: https://www.moore.com/  Fact Sheet for Healthcare Providers: https://www.young.biz/  This test is not yet approved or cleared by the [place] FDA and  has been authorized for detection and/or diagnosis of SARS-CoV-2 by  FDA under an Emergency Use Authorization (EUA). This EUA will remain  in effect (meaning this test can be used) for the duration of the  Covid-19 declaration under Section 564(b)(1) of the Act, 21  U.S.C. section 360bbb-3(b)(1), unless the authorization is  terminated or revoked. Performed at [facility], [address] [place], [place] [address]   Culture, blood (routine x 2)     Status: None   Collection Time: 07/28/20  3:24 PM   Specimen: BLOOD RIGHT HAND  Result Value Ref Range Status   Specimen Description   Final    BLOOD RIGHT HAND Performed at [facility], [address], [place], [place] [address]    Special Requests   Final    BOTTLES DRAWN AEROBIC AND ANAEROBIC Blood Culture adequate volume Performed at [facility], [address], [place], [place] [address]    Culture   Final  NO GROWTH 5 DAYS Performed at [facility], [address], [place], [place] [address]    Report Status 08/02/2020 FINAL  Final  CSF culture     Status: None   Collection Time: 07/28/20  8:19 PM   Specimen: CSF; Cerebrospinal Fluid  Result Value Ref Range Status   Specimen Description   Final    CSF Performed at [facility], [address] [place], [place] [address]    Special Requests   Final    Immunocompromised Performed at [facility], [address], [place], [place] [address]    Gram Stain   Final    WBC PRESENT, PREDOMINANTLY MONONUCLEAR NO ORGANISMS SEEN CYTOSPIN SMEAR    Culture   Final    NO GROWTH Performed at [facility], [address], [place], [place] [address]    Report Status 08/01/2020 FINAL  Final  Fungus Culture With Stain     Status: None (Preliminary result)   Collection Time: 07/28/20  8:19 PM   Specimen: Lumbar Puncture; Cerebrospinal Fluid  Result Value Ref Range Status   Fungus Stain Final report  Final    Comment: (NOTE) Performed At: [facility] [address] [place], [place] [id number] Jolene Schimke MD [id number]    Fungus (Mycology) Culture PENDING  Incomplete   Fungal Source CSF  Final    Comment: Performed at [facility], [address] [place], [place] [address]  Fungus Culture Result     Status: None    Collection Time: 07/28/20  8:19 PM  Result Value Ref Range Status   Result 1 Comment  Final    Comment: (NOTE) KOH/Calcofluor preparation:  no fungus observed. Performed At: [facility] [address] [place], [place] [id number] Jolene Schimke MD [id number]   HSV 1/2 Ab IgG/IgM CSF     Status: None   Collection Time: 07/28/20  9:28 PM   Specimen: CSF; Cerebrospinal Fluid  Result Value Ref Range Status   HSV 1/2 Ab, IgM, CSF 0.12 <=0.89 IV Final    Comment: (NOTE) INTERPRETIVE INFORMATION: Herpes Simplex Virus                          Type 1 and/or 2 Antibodies,                          IgM by ELISA, CSF  0.89 IV or Less .......... Negative: No significant                             level of detectable HSV IgM                             antibody.  0.90 - 1.09 IV ........... Equivocal: Questionable                             presence of IgM antibodies.                             Repeat testing in 10-14 days                             may be helpful.  1.10 IV or Greater ....... Positive: IgM antibody to HSV                             detected, which may indicate a                             current or recent infection.                             However, low levels of IgM                             antibodies may occasionally                             persist for more than 12                             months post-infection. The detection of antibodies to herpes simplex virus in CSF may indicate central nervou s system infection. However, consideration must be given to possible contamination by blood or transfer of serum antibodies across the blood-brain barrier. Fourfold or greater rise in CSF antibodies to herpes on specimens at least 4 weeks apart are found in 74-94 % of patients with herpes encephalitis. Specificity of the test based on a single CSF testing is not established. Presently PCR is the primary means of establishing a diagnosis of herpes  encephalitis. This test was developed and its performance characteristics determined by [facility]. It has not been cleared or approved by the [place] Food and Drug Administration. This test was performed in a CLIA certified laboratory and is intended for clinical purposes.    HSV 1/2 Ab Screen IgG, CSF 0.69 <=0.89 IV Final    Comment: (NOTE) INTERPRETIVE INFORMATION: Herpes Simplex Virus Type 1 and/or 2                    Antibodies, IgG CSF  0.89 IV or Less .......... Negative: No significant                             level of detectable HSV IgG                             antibody.  0.90 - 1.09 IV ........... Equivocal: Questionable                             presence of IgG antibodies.                             Repeat testing in 10-14 days                             may be helpful.  1.10 IV or Greater ....... Positive: IgG antibody to HSV                             detected, which may indicate  a current or past HSV                             infection. The detection of antibodies to herpes simplex virus in CSF may indicate central nervous system infection. However, consideration must be given to possible contamination by blood or transfer of serum antibodies across the blood-brain barrier. Fourfold or greater rise in CSF antibodies to herpes on specimens at l east 4 weeks apart are found in 74-94 % of patients with herpes encephalitis. Specificity of the test based on a single CSF testing is not established. Presently PCR is the primary means of establishing a diagnosis of herpes encephalitis. This test was developed and its performance characteristics determined by [facility]. It has not been cleared or approved by the US Food and Drug Administration. This test was performed in a CLIA certified laboratory and is intended for clinical purposes. Performed At: [facility] [address] [place], [place]  [id number] Elinor ParkinsonGeorge Tracy I MD WU:[id number]:[phone]      Rexene AlbertsStephanie Toluwanimi Radebaugh, MSN, NP-C [facility]  [email or web address] Pager: [phone] Office: [phone] [facility] Main Line: [phone]
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
## 2020-08-07 NOTE — TOC Progression Note (Signed)
Transition of Care [facility]) - Progression Note    Patient Details  Name: Kazuto Sevey MRN: [id number] Date of Birth: 22-Feb-1981  Transition of [facility] CM/SW Contact  Eduard Roux, [place] Phone Number: 08/07/2020, 3:43 PM  Clinical Narrative:     Received call from [place]- they confirm patient has no VA insurance- non service connected. Patient is seen by Nurse Practitioner Ragan?? CSW was advised to contact [facility] [phone] ext [phone] pager # [phone]( she is covering for [employer]. CSW called left voice message to return call- CSW paged SW as will - no response.   CSW spoke with Cinday Rochell- [facility] Financial Counselor - to screen for Medicaid and disability for patient. Arline Asp advised she will review chart and follow up with CSW, including forwarding referral to [facility].   CSW will continue to follow and assist with discharge planning.  Antony Blackbird, MSW, LCSWA Clinical Social Worker    Expected Discharge Plan: Skilled Nursing Facility Barriers to Discharge: Inadequate or no insurance, Continued Medical Work up, SNF Pending bed offer  Expected Discharge Plan and Services Expected Discharge Plan: Skilled Nursing Facility In-house Referral: Clinical Social Work     Living arrangements for the past 2 months:  (lives in  an shed on friend property)                                       Social Determinants of Health (SDOH) Interventions    Readmission Risk Interventions No flowsheet data found.

## 2020-08-07 NOTE — Progress Notes (Addendum)
PROGRESS NOTE    Travis Mcgee  BWG:[id number] DOB: [birth year] DOA: 07/28/2020 PCP: Patient, No Pcp Per    Brief Narrative:  Patient admitted to the hospital with working diagnosis of [name] syndrome.  39 year old male with significant past medical history for HIV/AIDSand latent syphiliswho presented with bilateral extremity pain. Positive pain for the last 7 to 10 days, associated with paresthesias and weakness. Progressive symptoms with multiple falls. On his initial physical examination blood pressure 118/86, heart rate 110, respiratory rate 36, oxygen saturation 100%. His lungs are clear to auscultation, heart S1-S2, present rhythmic, soft abdomen, lower extremity edema. His strength was preserved upper extremities, he had a slow gait.  Patient underwent further work-up with CT head of brain MRI which were negative. MRI of the cervical thoracic lumbar spine was unremarkable. Lumbar puncture showed negative culture, negative cryptococcus antigen and VDRL negative. He had elevated CSF IgG/albumin ratio consistent with intrathecal antibiotic production.  Neurology was consulted with recommendations to administer IVIG for 5 days.  Patient continue to have persistent lower extremity weakness and painful paresthesias.Ck 122 on 07/28/20. 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 He has no fevers, western blot negative, consistent with false positive.   Neuro work up with B1 levels, serum ACE and heavy metal screen.   Patient will need SNF for discharge to continue physical therapy and reconditioning.   Assessment & Plan:   Principal Problem:   Guillain Barr syndrome (HCC) Active Problems:   AIDS (acquired immune deficiency syndrome) (HCC)   History of syphilis   Lactic acidosis   Malnutrition of moderate degree   1. 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 His symptoms are persistent but mild improvement in pain control and strength.   Tolerating well gabapentin to 900 tid,tramadol, and methocarbamol. Plan to complete treatment for neurosyphilis with IV penicillin for 10 days, today is #5/10    2. HIV/ AIDS.Continue with antiretroviral therapyandprophylacticbactrim. On nutritional supplements.  3. Chronic disease anemia.Stable cell count.  4. Hyponatremia, hypomagenesemia and hypokalemia.Patient is tolerating po well.   5. Moderate calorie protein malnutrition. Continue with nutritional supplements.   Status is: Inpatient  Remains inpatient appropriate because:Unsafe d/c plan   Dispo:  Patient From: Home  Planned Disposition: Skilled Nursing Facility  Expected discharge date: 08/08/20  Medically stable for discharge: No Patient is medically stable but not safe for home dc due to ambulatory dysfunction     DVT prophylaxis: Enoxaparin   Code Status:   full  Family Communication:  No family at the bedside      Nutrition Status: Nutrition Problem: Moderate Malnutrition Etiology: chronic illness (HIV) Signs/Symptoms: mild fat depletion, severe muscle depletion Interventions: Ensure Enlive (each supplement provides 350kcal and 20 grams of protein), MVI     Skin Documentation:     Consultants:   neurology   ID   Procedures:   Lumbar puncture   Antimicrobials:   Penicillin     Subjective: Patient continue to feel weak at his lower extremities with painful paresthesias, no nausea or vomiting, no chest pain. Overall mild improvement in symptoms but not yet back to baseline .,   Objective: Vitals:   08/07/20 0111 08/07/20 0336 08/07/20 0758 08/07/20 1246  BP: 108/71 101/69 105/85 106/73  Pulse: 94 92    Resp: 18 20 18 18   Temp: 98.2 F (36.8 C) 97.8 F (36.6 C) 98.2 F (36.8 C) 97.8 F (36.6 C)  TempSrc: Oral Oral Oral Oral  SpO2: 96% 94% 94% 97%  Weight:       Height:        Intake/Output Summary (Last 24 hours) at 08/07/2020 1447 Last data filed at 08/07/2020 1248 Gross per 24 hour  Intake 480 ml  Output 1250 ml  Net -770 ml   Filed Weights   07/28/20 1352  Weight: 65.8 kg    Examination:   General: Not in pain or dyspnea, deconditioned  Neurology: Awake and alert, non focal  E ENT: no pallor, no icterus, oral mucosa moist Cardiovascular: No JVD. S1-S2 present, rhythmic, no gallops, rubs, or murmurs. No lower extremity edema. Pulmonary: positive breath sounds bilaterally,  Gastrointestinal. Abdomen soft and non tender Skin. No rashes Musculoskeletal: no joint deformities     Data Reviewed: I have personally reviewed following labs and imaging studies  CBC: Recent Labs  Lab 07/31/20 1454 08/01/20 0440  WBC 3.9* 4.0  HGB 10.0* 9.6*  HCT 30.2* 29.7*  MCV 104.1* 106.8*  PLT 202 213   Basic Metabolic Panel: Recent Labs  Lab 07/31/20 1454 08/01/20 0440 08/01/20 1230 08/02/20 0207 08/03/20 0049 08/05/20 0127  NA  --  133*  --  132* 133* 133*  K  --  4.5  --  4.7 4.5 4.0  CL  --  101  --  104 103 102  CO2  --  26  --  23 23 25   GLUCOSE  --  81  --  102* 112* 131*  BUN  --  <5*  --  5* 7 11  CREATININE  --  0.70  --  0.64 0.73 0.82  CALCIUM  --  8.6*  --  8.5* 8.8* 9.3  MG 1.6*  --  1.7 1.9  --   --    GFR: Estimated Creatinine Clearance: 111.4 mL/min (by C-G formula based on SCr of 0.82 mg/dL). Liver Function Tests: Recent Labs  Lab 07/31/20 1454  AST 59*  ALT 47*  ALKPHOS 125  BILITOT 0.4  PROT 7.4  ALBUMIN 1.9*   No results for input(s): LIPASE, AMYLASE in the last 168 hours. No results for input(s): AMMONIA in the last 168 hours. Coagulation Profile: No results for input(s): INR, PROTIME in the last 168 hours. Cardiac Enzymes: Recent Labs  Lab 08/05/20 0127  CKTOTAL 49   BNP (last 3 results) No results for input(s): PROBNP in the last 8760 hours. HbA1C: No results for input(s): HGBA1C in  the last 72 hours. CBG: No results for input(s): GLUCAP in the last 168 hours. Lipid Profile: No results for input(s): CHOL, HDL, LDLCALC, TRIG, CHOLHDL, LDLDIRECT in the last 72 hours. Thyroid Function Tests: No results for input(s): TSH, T4TOTAL, FREET4, T3FREE, THYROIDAB in the last 72 hours. Anemia Panel: No results for input(s): VITAMINB12, FOLATE, FERRITIN, TIBC, IRON, RETICCTPCT in the last 72 hours.    Radiology Studies: I have reviewed all of the imaging during this hospital visit personally     Scheduled Meds: . bictegravir-emtricitabine-tenofovir AF  1 tablet Oral Daily  . enoxaparin (LOVENOX) injection  40 mg Subcutaneous Q24H  . [START ON 08/08/2020] feeding supplement (ENSURE ENLIVE)  237 mL Oral Q24H  . gabapentin  900 mg Oral TID  . methocarbamol  500 mg Oral TID  . multivitamin with minerals  1 tablet Oral Daily  . sulfamethoxazole-trimethoprim  1 tablet Oral Daily  . thiamine injection  100 mg Intravenous Daily   Or  . thiamine  100 mg Oral Daily  .  vitamin B-12  1,000 mcg Oral Daily   Continuous Infusions: . penicillin g continuous IV infusion 6 Million Units (08/07/20 0335)     LOS: 10 days        Roland Prine Annett Gula, MD

## 2020-08-07 NOTE — Progress Notes (Signed)
Nutrition Follow-up  DOCUMENTATION CODES:   Non-severe (moderate) malnutrition in context of chronic illness  INTERVENTION:    Ensure Enlive po once daily, each supplement provides 350 kcal and 20 grams of protein  MVI daily   NUTRITION DIAGNOSIS:   Moderate Malnutrition related to chronic illness (HIV) as evidenced by mild fat depletion, severe muscle depletion.  Ongoing  GOAL:   Patient will meet greater than or equal to 90% of their needs  Progressing  MONITOR:   PO intake, Supplement acceptance, Weight trends, Labs, I & O's  REASON FOR ASSESSMENT:   Consult Assessment of nutrition requirement/status  ASSESSMENT:   Patient with PMH significant for HIV/AIDS on HAART and latent syphilis. 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  Pt endorses having a great appetite. Last eight meal completions charted as 100%. Drinking one Ensure daily. Does not wish to have more than one Ensure since he is finishing most of his meals.   Prior to admission, pt denies loss in appetite or weight loss. Endorses a UBW of 145 lb.   UOP: 1500 ml x 24 hrs   Medications: thiamine, Vit B12 Labs: Na 133 (L)   NUTRITION - FOCUSED PHYSICAL EXAM:    Most Recent Value  Orbital Region No depletion  Upper Arm Region Moderate depletion  Thoracic and Lumbar Region Unable to assess  Buccal Region Mild depletion  Temple Region No depletion  Clavicle Bone Region Mild depletion  Clavicle and Acromion Bone Region Moderate depletion  Scapular Bone Region Unable to assess  Dorsal Hand No depletion  Patellar Region Severe depletion  Anterior Thigh Region Severe depletion  Posterior Calf Region Severe depletion  Edema (RD Assessment) None  Hair Reviewed  Eyes Reviewed  Mouth Reviewed  Skin Reviewed  Nails Reviewed     Diet Order:   Diet Order            Diet regular Room service appropriate? Yes; Fluid consistency: Thin  Diet effective now                 EDUCATION NEEDS:    Education needs have been addressed  Skin:  Skin Assessment: Reviewed RN Assessment  Last BM:  10/9  Height:   Ht Readings from Last 1 Encounters:  07/28/20 5\' 11"  (1.803 m)    Weight:   Wt Readings from Last 1 Encounters:  07/28/20 65.8 kg    BMI:  Body mass index is 20.22 kg/m.  Estimated Nutritional Needs:   Kcal:  2250-2450 kcal  Protein:  115-130 grams  Fluid:  >/= 2 L/day   09/27/20 RD, LDN Clinical Nutrition Pager listed in [facility]

## 2020-08-08 DIAGNOSIS — G610 Guillain-Barre syndrome: Secondary | ICD-10-CM | POA: Diagnosis not present

## 2020-08-08 DIAGNOSIS — E440 Moderate protein-calorie malnutrition: Secondary | ICD-10-CM | POA: Diagnosis not present

## 2020-08-08 DIAGNOSIS — B20 Human immunodeficiency virus [HIV] disease: Secondary | ICD-10-CM | POA: Diagnosis not present

## 2020-08-08 DIAGNOSIS — Z8619 Personal history of other infectious and parasitic diseases: Secondary | ICD-10-CM | POA: Diagnosis not present

## 2020-08-08 LAB — HOMOCYSTEINE: Homocysteine: 15.2 umol/L — ABNORMAL HIGH (ref 0.0–14.5)

## 2020-08-08 MED ORDER — DULOXETINE HCL 20 MG PO CPEP
20.0000 mg | ORAL_CAPSULE | Freq: Every day | ORAL | Status: DC
  Administered 2020-08-08 – 2020-08-18 (×11): 20 mg via ORAL
  Filled 2020-08-08 (×11): qty 1

## 2020-08-08 NOTE — Progress Notes (Signed)
PROGRESS NOTE    Joanthony Hamza  UMP:[id number] DOB: 1981-01-25 DOA: 07/28/2020 PCP: Patient, No Pcp Per    Brief Narrative:  Patient admitted to the hospital with working diagnosis of [name] syndrome.  39 year old male with significant past medical history for HIV/AIDSand latent syphiliswho presented with bilateral extremity pain. Positive pain for the last 7 to 10 days, associated with paresthesias and weakness. Progressive symptoms with multiple falls. On his initial physical examination blood pressure 118/86, heart rate 110, respiratory rate 36, oxygen saturation 100%. His lungs are clear to auscultation, heart S1-S2, present rhythmic, soft abdomen, lower extremity edema. His strength was preserved upper extremities, he had a slow gait.  Patient underwent further work-up with CT head of brain MRI which were negative. MRI of the cervical thoracic lumbar spine was unremarkable. Lumbar puncture showed negative culture, negative cryptococcus antigen and VDRL negative. He had elevated CSF IgG/albumin ratio consistent with intrathecal antibiotic production.  Neurology was consulted with recommendations to administer IVIG for 5 days.  Patient continue to have persistent lower extremity weakness and painful paresthesias.Ck 122 on 07/28/20. 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 He has no fevers, western blot negative, consistent with false positive.   Neuro work up with B1 levels, serum ACE and heavy metal screen.  Patient will need SNF for discharge to continue physical therapy and reconditioning.    Assessment & Plan:   Principal Problem:   Guillain Barr syndrome (HCC) Active Problems:   AIDS (acquired immune deficiency syndrome) (HCC)   History of syphilis   Lactic acidosis   Malnutrition of moderate degree   1. 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 Continue to have pain and decreased strength with ambulatory dysfunction.   Continue with gabapentin to 900 tid,tramadol, and methocarbamol. because persistent symptoms will add duloxetine.   Complete treatment for neurosyphilis with IV penicillin for 10 days,  #6/10  On thiamine and will need outpatient follow up with neurology.   Social work has been consulted for placement.   2. HIV/ AIDS.On antiretroviral therapyandprophylacticbactrim. Continue withnutritional supplements.  3. Chronic disease anemia.Stable cell count.  4. Hyponatremia, hypomagenesemia and hypokalemia.Continue with good po toleration  5. Moderate calorie protein malnutrition. On Nutritional supplements.      Status is: Inpatient  Remains inpatient appropriate because:IV treatments appropriate due to intensity of illness or inability to take PO   Dispo:  Patient From: Home  Planned Disposition: Skilled Nursing Facility  Expected discharge date: 08/13/20  Medically stable for discharge: No     DVT prophylaxis: Enoxaparin   Code Status:   full  Family Communication:  No family at the bedside      Nutrition Status: Nutrition Problem: Moderate Malnutrition Etiology: chronic illness (HIV) Signs/Symptoms: mild fat depletion, severe muscle depletion Interventions: Ensure Enlive (each supplement provides 350kcal and 20 grams of protein), MVI     Consultants:   neurology   ID   Procedures:   Lumbar puncture   Antimicrobials:   Penicillin    Subjective: Patient continue to have painful paresthesias and difficulty ambulating, no nausea or vomiting, no dyspnea or chest pain.   Objective: Vitals:   08/07/20 1952 08/07/20 2334 08/08/20 0318 08/08/20 0732  BP: 115/83 107/71 110/70 98/66  Pulse: 98 94 96   Resp: 18 16 18 17   Temp: 98.4 F (36.9 C) 98.2 F (36.8 C) 98.3 F (36.8 C) 98.1 F (36.7 C)  TempSrc:  Oral Oral Oral Oral  SpO2: 95%  96% 95% 96%  Weight:      Height:        Intake/Output Summary (Last 24 hours) at 08/08/2020 1345 Last data filed at 08/08/2020 1232 Gross per 24 hour  Intake 640 ml  Output 2100 ml  Net -1460 ml   Filed Weights   07/28/20 1352  Weight: 65.8 kg    Examination:   General: Not in pain or dyspnea, deconditioned  Neurology: Awake and alert, lower extremities 3-4/5 bilateral.  E ENT: no pallor, no icterus, oral mucosa moist Cardiovascular: No JVD. S1-S2 present, rhythmic, no gallops, rubs, or murmurs. No lower extremity edema. Pulmonary: positive breath sounds bilaterally,  no wheezing, rhonchi or rales. Gastrointestinal. Abdomen soft and non tender Skin. No rashes Musculoskeletal: tender to palpation bilateral thighs.      Data Reviewed: I have personally reviewed following labs and imaging studies  CBC: No results for input(s): WBC, NEUTROABS, HGB, HCT, MCV, PLT in the last 168 hours. Basic Metabolic Panel: Recent Labs  Lab 08/02/20 0207 08/03/20 0049 08/05/20 0127  NA 132* 133* 133*  K 4.7 4.5 4.0  CL 104 103 102  CO2 23 23 25   GLUCOSE 102* 112* 131*  BUN 5* 7 11  CREATININE 0.64 0.73 0.82  CALCIUM 8.5* 8.8* 9.3  MG 1.9  --   --    GFR: Estimated Creatinine Clearance: 111.4 mL/min (by C-G formula based on SCr of 0.82 mg/dL). Liver Function Tests: No results for input(s): AST, ALT, ALKPHOS, BILITOT, PROT, ALBUMIN in the last 168 hours. No results for input(s): LIPASE, AMYLASE in the last 168 hours. No results for input(s): AMMONIA in the last 168 hours. Coagulation Profile: No results for input(s): INR, PROTIME in the last 168 hours. Cardiac Enzymes: Recent Labs  Lab 08/05/20 0127  CKTOTAL 49   BNP (last 3 results) No results for input(s): PROBNP in the last 8760 hours. HbA1C: No results for input(s): HGBA1C in the last 72 hours. CBG: No results for input(s): GLUCAP in the last 168 hours. Lipid Profile: No results for input(s): CHOL, HDL, LDLCALC,  TRIG, CHOLHDL, LDLDIRECT in the last 72 hours. Thyroid Function Tests: No results for input(s): TSH, T4TOTAL, FREET4, T3FREE, THYROIDAB in the last 72 hours. Anemia Panel: No results for input(s): VITAMINB12, FOLATE, FERRITIN, TIBC, IRON, RETICCTPCT in the last 72 hours.    Radiology Studies: I have reviewed all of the imaging during this hospital visit personally     Scheduled Meds: . bictegravir-emtricitabine-tenofovir AF  1 tablet Oral Daily  . enoxaparin (LOVENOX) injection  40 mg Subcutaneous Q24H  . feeding supplement  237 mL Oral Q24H  . gabapentin  900 mg Oral TID  . methocarbamol  500 mg Oral TID  . multivitamin with minerals  1 tablet Oral Daily  . sulfamethoxazole-trimethoprim  1 tablet Oral Daily  . thiamine injection  100 mg Intravenous Daily   Or  . thiamine  100 mg Oral Daily  . vitamin B-12  1,000 mcg Oral Daily   Continuous Infusions: . penicillin g continuous IV infusion 6 Million Units (08/08/20 0319)     LOS: 11 days        Angla Delahunt 08/10/20, MD

## 2020-08-08 NOTE — Progress Notes (Addendum)
Physical Therapy Treatment Patient Details Name: Travis Mcgee MRN: [id number] DOB: October 01, 1981 Today's Date: 08/08/2020    History of Present Illness Travis Mcgee is a 39 y.o. male with medical history significant of HIV/AIDS had been on HAART.Pt presents to ED with c/o pain and weakness in BLE.  Thought to be secondary to Guillain barre and was started on IVIG.     PT Comments    Pt seated edge of bed on arrival.  Pt is progressing well this session.  He is performing gt training with IV independently.  Encouraged trial without IV pole and he was mildly unsteady but no LOB noted.  He reports he has a poor path to his home to enter and may require a RW at d/c for support to enter his home,  Will inform supervising PT of need for change in recommendations at this time.    Home health PT     Equipment Recommendations  Rolling walker with 5" wheels    Recommendations for Other Services       Precautions / Restrictions Precautions Precautions: Fall Precaution Comments: Has had multiple falls.  Restrictions Weight Bearing Restrictions: No    Mobility  Bed Mobility Overal bed mobility: Independent                Transfers Overall transfer level: Modified independent Equipment used: None Transfers: Sit to/from Stand Sit to Stand: Modified independent (Device/Increase time)            Ambulation/Gait Ambulation/Gait assistance: Supervision;Modified independent (Device/Increase time) (MOD I holding IV pole and supervision without holding to IV pole.) Gait Distance (Feet): 200 Feet (+ 80 ft) Assistive device: IV Pole;None Gait Pattern/deviations: Step-through pattern;Decreased stride length;Trunk flexed Gait velocity: Decreased   General Gait Details: Pt holding to IV pole for first 200 ft.  He performed additional trial without IV pole and able to maintain balance with minimal instability.  NO overt LOB and cues for reciprocal armswing.   Stairs              Wheelchair Mobility    Modified Rankin (Stroke Patients Only)       Balance Overall balance assessment: Needs assistance Sitting-balance support: No upper extremity supported Sitting balance-Leahy Scale: Good       Standing balance-Leahy Scale: Fair                              Cognition Arousal/Alertness: Awake/alert Behavior During Therapy: WFL for tasks assessed/performed Overall Cognitive Status: No family/caregiver present to determine baseline cognitive functioning                                 General Comments: Pt with decreased safety awareness and decreased awareness of deficits.       Exercises General Exercises - Lower Extremity Hip Flexion/Marching: AROM;Both;10 reps;Standing Heel Raises: AROM;Both;10 reps;Standing Mini-Sqauts: AROM;Both;10 reps;Standing    General Comments        Pertinent Vitals/Pain Pain Assessment: Faces Faces Pain Scale: Hurts a little bit Pain Location: B calves with strengthening exercises. Pain Descriptors / Indicators: Pins and needles;Tingling Pain Intervention(s): Monitored during session;Repositioned    Home Living                      Prior Function            PT Goals (current goals can now be found  in the care plan section) Acute Rehab PT Goals Patient Stated Goal: to get back to work Potential to Achieve Goals: Fair Progress towards PT goals: Progressing toward goals    Frequency    Min 2X/week      PT Plan Discharge plan needs to be updated    Co-evaluation              AM-PAC PT "6 Clicks" Mobility   Outcome Measure  Help needed turning from your back to your side while in a flat bed without using bedrails?: None Help needed moving from lying on your back to sitting on the side of a flat bed without using bedrails?: None Help needed moving to and from a bed to a chair (including a wheelchair)?: None Help needed standing up from a chair using your  arms (e.g., wheelchair or bedside chair)?: None Help needed to walk in hospital room?: None Help needed climbing 3-5 steps with a railing? : A Little 6 Click Score: 23    End of Session Equipment Utilized During Treatment: Gait belt Activity Tolerance: Patient limited by pain Patient left:  (standing in room as he is ambulating unit with support of IV pole independently.) Nurse Communication: Mobility status PT Visit Diagnosis: Unsteadiness on feet (R26.81);Muscle weakness (generalized) (M62.81)     Time: 9147-8295 PT Time Calculation (min) (ACUTE ONLY): 21 min  Charges:  $Gait Training: 8-22 mins                     [name] , PTA Acute Rehabilitation Services Pager [phone] Office [phone]     Travis Mcgee 08/08/2020, 4:57 PM

## 2020-08-08 NOTE — Progress Notes (Addendum)
NEUROLOGY CONSULTATION PROGRESS NOTE   Date of service: August 08, 2020 Patient Name: Travis Mcgee MRN:  [id number] DOB:  07-16-1981  Brief HPI  Travis Mcgee is a 39 y.o. male with hx of HIV p/w ascending weakness, pain and sensory loss over 2 weeks. No symptoms of cord compression, no preceding URI or GI infection. Noted to have some hoarseness but no other respiratory symptoms. Workup with MRI entire neuro axis with and without contrast did not reveal an etiology. LP with no pleocytosis, no elevated protein, olgiclonal bands are negative, IgG index was positive however. CK negative. He was started on IVIG 0.4g/Kg daily x 5 doses with worsening of symptoms despite IVIG. Tingling and sensitivity increased to the level of umblicus. 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 He was given additional Penicillin treatment considered recent sexual activity.   Interval Hx   Symptoms have plateaud. Feels that the tingling is still very bothersome for him. Has been requiring assistance with walking. But weakness is not getting any worse, sensation is a little better. 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  Vitals   Vitals:   08/07/20 1952 08/07/20 2334 08/08/20 0318 08/08/20 0732  BP: 115/83 107/71 110/70 98/66  Pulse: 98 94 96   Resp: 18 16 18 17   Temp: 98.4 F (36.9 C) 98.2 F (36.8 C) 98.3 F (36.8 C) 98.1 F (36.7 C)  TempSrc: Oral Oral Oral Oral  SpO2: 95% 96% 95% 96%  Weight:      Height:         Body mass index is 20.22 kg/m.  Physical Exam   General: Laying comfortably in bed; in no acute distress. HENT: Normal oropharynx and mucosa. Normal external appearance of ears and nose. Neck: Supple, no pain or tenderness  CV: No JVD. No peripheral edema.  Pulmonary: Symmetric Chest rise. Normal respiratory effort.   Abdomen: Soft to touch, non-tender.  Ext: No cyanosis, edema, or deformity  Skin: No rash. Normal palpation of skin.   Musculoskeletal: Normal digits and nails by inspection. No clubbing.   Neurologic Examination  Mental status/Cognition: Alert, oriented to self, place, month and year, good attention. Speech/language: High pitched, fluent, comprehension intact, object naming intact, repetition intact. Cranial nerves:   CN II Pupils equal and reactive to light, no VF deficits   CN III,IV,VI EOM intact, no gaze preference or deviation, no nystagmus   CN V normal sensation in V1, V2, and V3 segments bilaterally   CN VII no asymmetry, no nasolabial fold flattening   CN VIII normal hearing to speech   CN IX & X normal palatal elevation, no uvular deviation   CN XI 5/5 head turn and 5/5 shoulder shrug bilaterally   CN XII midline tongue protrusion   Motor:  Muscle bulk: normal, tone normal, pronator drift none tremor none Mvmt Root Nerve  Muscle Right Left Comments  SA C5/6 Ax Deltoid 5 5   EF C5/6 Mc Biceps 5 5   EE C6/7/8 Rad Triceps 5 5   WF C6/7 Med FCR 5 5   WE C7/8 PIN ECU 5 5   F Ab C8/T1 U ADM/FDI 5 5   HF L1/2/3 Fem Illopsoas 4 4   KE L2/3/4 Fem Quad 4 4   DF L4/5 D Peron Tib Ant 3 3   PF S1/2 Tibial Grc/Sol 5 5  Reflexes:  Right Left Comments  Pectoralis      Biceps (C5/6) 1 1   Brachioradialis (C5/6) 2 2    Triceps (C6/7) 1 1    Patellar (L3/4) 0 0    Achilles (S1) 0 0    Hoffman - -    Plantar mute mute   Jaw jerk    Sensation:  Light touch Allodynia in BL legs and hips   Pin prick    Temperature Decreased in BL legs.   Vibration   Proprioception    Coordination/Complex Motor:  - Finger to Nose intact BL - Heel to shin unable to do - Rapid alternating movement are normal - Gait: Deferred 2/2 significat pain, reports requiring assistance to walk.  Labs   Basic Metabolic Panel:  Lab Results  Component Value Date   NA 133 (L) 08/05/2020   K 4.0  08/05/2020   CO2 25 08/05/2020   GLUCOSE 131 (H) 08/05/2020   BUN 11 08/05/2020   CREATININE 0.82 08/05/2020   CALCIUM 9.3 08/05/2020   GFRNONAA >60 08/05/2020   GFRAA >60 07/30/2020   HbA1c: No results found for: HGBA1C LDL: No results found for: [facility] Urine Drug Screen:     Component Value Date/Time   LABOPIA NONE DETECTED 07/28/2020 1303   COCAINSCRNUR NONE DETECTED 07/28/2020 1303   LABBENZ NONE DETECTED 07/28/2020 1303   AMPHETMU NONE DETECTED 07/28/2020 1303   THCU NONE DETECTED 07/28/2020 1303   LABBARB NONE DETECTED 07/28/2020 1303    Alcohol Level No results found for: ETH No results found for: PHENYTOIN, ZONISAMIDE, LAMOTRIGINE, LEVETIRACETA No results found for: PHENYTOIN, PHENOBARB, VALPROATE, CBMZ  Imaging and Diagnostic studies  Results for orders placed during the hospital encounter of 07/28/20  MR BRAIN WO CONTRAST Normal brain MRI.  MR Cervical, thoracic and Lumbar spine with and without contrast: Normal MRI of the cervical, thoracic and lumbar spine.   Impression   Travis Mcgee is a 39 y.o. male with PMH significant for HIV, syphilis, p/w ascending weakness, pain and sensory loss. 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 Pending workup includes porphyria screening, Vit B1 levels, Heavy metal screen, serum ACE and plan for outpatient paraneoplastic panel, [employer] sensory neuropathy panel, serum SPEP(not ordered inpatient as IVIG will confound these panels). Symptoms plateaud around 3 weeks. HIV and syphilis titers are improved and combined with LP findings, low suspicion for acute polyradiculoneuritis secondary to HIV. He did get an additional dose of penicilling on 10/4 given recent sexual activity.  I dont thinks that getting protein electrophoresis at this time or paraneoplastic panel is going to be useful  as it will be confounded by IVIG. Maybe a good idea to get this ina few weeks.  Recommendations  - continue daily Thiamine and Cyanocobalamin replacement. - He is on Gabapentin 900mg  TID, can add Duloxetine for neuropathic pain if needed. - Will hold off on repeat MRIs of his spine as symptoms have plateud. - Recommend getting serum paraneoplastic panel, sensory neuropathy panel and serum Protein electrophoresis outpatient. These can be confounded by Antibodies present in IVIG that was recently given. - As for considering repeat IVIG or PLEX, repeat treatment is only recommended for fluctuating course of GBS. His case is not fluctuating. I don't think he will benefit from repeat IVIG or from PLEX. - Recommend outpatient EMG with NCS. - Follow up with neurology outpatient in  5-6 weeks from discharge. - PT/OT and discharge disposition per PT and OT. ______________________________________________________________________   Thank you for the opportunity to take part in the care of this patient. If you have any further questions, please contact the neurology consultation attending.  Signed,  Erick Blinks [facility] Pager Number [phone]

## 2020-08-08 NOTE — Plan of Care (Signed)
  Problem: Activity: Goal: Risk for activity intolerance will decrease Outcome: Progressing   

## 2020-08-08 NOTE — TOC Progression Note (Addendum)
Transition of Care [facility]) - Progression Note    Patient Details  Name: Travis Mcgee MRN: [id number] Date of Birth: 1981/10/04  [facility] of [facility]) CM/SW Contact  Travis Mcgee, [place] Phone Number: 08/08/2020, 4:27 PM  Clinical Narrative:     CSW visit with the patient- patient was sitting on the side of the bed. CSW advised no bed offers and more than likely will discharge home. Explained, per chart review he is walking over 400 feet and SNF may no longer be appropriate once he has completed his IV antibiotics and medically stable for discharge. Patient states he has no problem returning back to where he was staying,  but was concerns about falling in the yard-" it has dips and holes". " I have things to hold onto here at the hospital" and he do not have money for medications.  CSW acknowledge his concerns and advised once he is medically ready for discharge, TOC will can further address his concerns. Patient states understanding.   Patient has been referred to [facility] Financial Counselor - for medicaid and disability screening- CSW provided the patient with Social Services number to follow up with application. Travis Mcgee-[facility] provided patient with her contact information as well.  [facility]is [facility]ge[facility] advised the patient is in the [facility]. Per [facility] his PCP is NP Travis Mcgee. [facility] is assisting with securing [facility] for patient. CSW encourage the patient to explore ALF and discuss with [facility] Child psychotherapist. CSW encourage the patient to contact [facility] while he has access to phone at the hospital to address housing concerns.   CSW will continue follow and assist with discharge planning.  Travis Mcgee, MSW, LCSWA Clinical Social Worker   Expected Discharge Plan: Skilled Nursing Facility Barriers to Discharge: Inadequate or no insurance, Continued Medical Work up, SNF Pending bed offer  Expected Discharge Plan and Services Expected Discharge Plan: Skilled Nursing  Facility In-house Referral: Clinical Social Work     Living arrangements for the past 2 months:  (lives in  an shed on friend property)                                       Social Determinants of Health (SDOH) Interventions    Readmission Risk Interventions No flowsheet data found.

## 2020-08-08 NOTE — Progress Notes (Signed)
Pt ambulated in the hall independently for 473ft. Tolerated well no distress.

## 2020-08-09 DIAGNOSIS — Z8619 Personal history of other infectious and parasitic diseases: Secondary | ICD-10-CM | POA: Diagnosis not present

## 2020-08-09 DIAGNOSIS — B20 Human immunodeficiency virus [HIV] disease: Secondary | ICD-10-CM | POA: Diagnosis not present

## 2020-08-09 DIAGNOSIS — E872 Acidosis: Secondary | ICD-10-CM | POA: Diagnosis not present

## 2020-08-09 DIAGNOSIS — G610 Guillain-Barre syndrome: Secondary | ICD-10-CM | POA: Diagnosis not present

## 2020-08-09 LAB — VITAMIN B1: Vitamin B1 (Thiamine): 72.3 nmol/L (ref 66.5–200.0)

## 2020-08-09 NOTE — Progress Notes (Signed)
PROGRESS NOTE    Travis Mcgee  TIW:[id number] DOB: April 19, 1981 DOA: 07/28/2020 PCP: Patient, No Pcp Per    Brief Narrative:  Patient admitted to the hospital with working diagnosis of [name] syndrome.  39 year old male with significant past medical history for HIV/AIDSand latent syphiliswho presented with bilateral extremity pain. Positive pain for the last 7 to 10 days, associated with paresthesias and weakness. Progressive symptoms with multiple falls. On his initial physical examination blood pressure 118/86, heart rate 110, respiratory rate 36, oxygen saturation 100%. His lungs are clear to auscultation, heart S1-S2, present rhythmic, soft abdomen, lower extremity edema. His strength was preserved upper extremities, he had a slow gait.  Patient underwent further work-up with CT head of brain MRI which were negative. MRI of the cervical thoracic lumbar spine was unremarkable. Lumbar puncture showed negative culture, negative cryptococcus antigen and VDRL negative. He had elevated CSF IgG/albumin ratio consistent with intrathecal antibiotic production.  Neurology was consulted with recommendations to administer IVIG for 5 days.  Patient continue to have persistent lower extremity weakness and painful paresthesias.Ck 122 on 07/28/20. 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 He has no fevers, western blot negative, consistent with false positive.  Neuro work up with B1 levels, serum ACE and heavy metal screen.  Patient will need SNF for discharge to continue physical therapy and reconditioning. Unfortunately no insurance, plan to dc home when clinically safe.    Assessment & Plan:   Principal Problem:   Guillain Barr syndrome (HCC) Active Problems:   AIDS (acquired immune deficiency syndrome) (HCC)   History of syphilis   Lactic acidosis   Malnutrition of  moderate degree   1. 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 His mobility has been improving but not yet back to baseline.   Tolerating well gabapentin to 900 tid,tramadol, methocarbamol and duloxetine.   Treatment for neurosyphilis withIVpenicillin for 10 days,  #7/10 Continue with thiamine.   Patient with no insurance coverage unable to get SNF, will plan to dc home after completing IV PNC, by the completion of antibiotics I expect patient to have better mobility and be a safer discharge.  Continue close neurologic monitoring, patient has been ambulating in the hallway.   2. HIV/ AIDS.Continue with antiretroviral therapyandprophylacticbactrim.  3. Chronic disease anemia.Stable cell count.  4. Hyponatremia, hypomagenesemia and hypokalemia.resolved  5. Moderate calorie protein malnutrition. Continue with Nutritional supplements.   Status is: Inpatient  Remains inpatient appropriate because:IV treatments appropriate due to intensity of illness or inability to take PO   Dispo:  Patient From: Home  Planned Disposition: Skilled Nursing Facility  Expected discharge date: 08/13/20  Medically stable for discharge: No    DVT prophylaxis: Enoxaparin   Code Status:   full  Family Communication:  No family at the bedside      Nutrition Status: Nutrition Problem: Moderate Malnutrition Etiology: chronic illness (HIV) Signs/Symptoms: mild fat depletion, severe muscle depletion Interventions: Ensure Enlive (each supplement provides 350kcal and 20 grams of protein), MVI      Consultants:   Neurology   ID   Procedures:    lumbar puncture   Antimicrobials:   PNC   Subjective: Patient continue to have painful paresthesias and ambulatory dysfunction, mild improvement and increased mobility, not yet back to baseline but improving.   Objective: Vitals:   08/08/20 1500 08/08/20 2036 08/09/20 0009 08/09/20 0335  BP: 112/81  (!) 106/92 104/74 109/74  Pulse:  (!) 109 96 88  Resp: 19 20 16 16   Temp: 97.8 F (36.6 C) 99.4 F (37.4 C) 98.1 F (36.7 C) 98.2 F (36.8 C)  TempSrc: Oral Oral Oral Oral  SpO2: 95% 100% 92% 93%  Weight:      Height:        Intake/Output Summary (Last 24 hours) at 08/09/2020 0804 Last data filed at 08/09/2020 0009 Gross per 24 hour  Intake 1680 ml  Output 750 ml  Net 930 ml   Filed Weights   07/28/20 1352  Weight: 65.8 kg    Examination:   General: Not in pain or dyspnea, deconditioned  Neurology: Awake and alert, non focal  E ENT: no pallor, no icterus, oral mucosa moist Cardiovascular: No JVD. S1-S2 present, rhythmic, no gallops, rubs, or murmurs. No lower extremity edema. Pulmonary: positive breath sounds bilaterally, Gastrointestinal. Abdomen soft and non tender Skin. No rashes Musculoskeletal: no joint deformities     Data Reviewed: I have personally reviewed following labs and imaging studies  CBC: No results for input(s): WBC, NEUTROABS, HGB, HCT, MCV, PLT in the last 168 hours. Basic Metabolic Panel: Recent Labs  Lab 08/03/20 0049 08/05/20 0127  NA 133* 133*  K 4.5 4.0  CL 103 102  CO2 23 25  GLUCOSE 112* 131*  BUN 7 11  CREATININE 0.73 0.82  CALCIUM 8.8* 9.3   GFR: Estimated Creatinine Clearance: 111.4 mL/min (by C-G formula based on SCr of 0.82 mg/dL). Liver Function Tests: No results for input(s): AST, ALT, ALKPHOS, BILITOT, PROT, ALBUMIN in the last 168 hours. No results for input(s): LIPASE, AMYLASE in the last 168 hours. No results for input(s): AMMONIA in the last 168 hours. Coagulation Profile: No results for input(s): INR, PROTIME in the last 168 hours. Cardiac Enzymes: Recent Labs  Lab 08/05/20 0127  CKTOTAL 49   BNP (last 3 results) No results for input(s): PROBNP in the last 8760 hours. HbA1C: No results for input(s): HGBA1C in the last 72 hours. CBG: No results for input(s): GLUCAP in the last 168 hours. Lipid  Profile: No results for input(s): CHOL, HDL, LDLCALC, TRIG, CHOLHDL, LDLDIRECT in the last 72 hours. Thyroid Function Tests: No results for input(s): TSH, T4TOTAL, FREET4, T3FREE, THYROIDAB in the last 72 hours. Anemia Panel: No results for input(s): VITAMINB12, FOLATE, FERRITIN, TIBC, IRON, RETICCTPCT in the last 72 hours.    Radiology Studies: I have reviewed all of the imaging during this hospital visit personally     Scheduled Meds: . bictegravir-emtricitabine-tenofovir AF  1 tablet Oral Daily  . DULoxetine  20 mg Oral Daily  . enoxaparin (LOVENOX) injection  40 mg Subcutaneous Q24H  . feeding supplement  237 mL Oral Q24H  . gabapentin  900 mg Oral TID  . methocarbamol  500 mg Oral TID  . multivitamin with minerals  1 tablet Oral Daily  . sulfamethoxazole-trimethoprim  1 tablet Oral Daily  . thiamine injection  100 mg Intravenous Daily   Or  . thiamine  100 mg Oral Daily  . vitamin B-12  1,000 mcg Oral Daily   Continuous Infusions: . penicillin g continuous IV infusion 6 Million Units (08/09/20 08/11/20)     LOS: 12 days        Jasmine Mcbeth 0762, MD

## 2020-08-10 DIAGNOSIS — Z8619 Personal history of other infectious and parasitic diseases: Secondary | ICD-10-CM | POA: Diagnosis not present

## 2020-08-10 DIAGNOSIS — B20 Human immunodeficiency virus [HIV] disease: Secondary | ICD-10-CM | POA: Diagnosis not present

## 2020-08-10 DIAGNOSIS — G610 Guillain-Barre syndrome: Secondary | ICD-10-CM | POA: Diagnosis not present

## 2020-08-10 DIAGNOSIS — E440 Moderate protein-calorie malnutrition: Secondary | ICD-10-CM | POA: Diagnosis not present

## 2020-08-10 NOTE — Progress Notes (Signed)
PROGRESS NOTE    Travis Mcgee  ALP:[id number] DOB: [birth year] DOA: 07/28/2020 PCP: Patient, No Pcp Per    Brief Narrative:  Travis Mcgee was admitted to the hospital with working diagnosis of [name] syndrome.  39 year old male with significant past medical history for HIV/AIDSand latent syphiliswho presented with bilateral extremity pain. Positive pain for the last 7 to 10 days, associated with paresthesias and weakness. Progressive symptoms with multiple falls. On his initial physical examination blood pressure 118/86, heart rate 110, respiratory rate 36, oxygen saturation 100%. His lungs were clear to auscultation, heart S1-S2, present rhythmic, soft abdomen, no lower extremity edema. His strength was preserved upper extremities, he had a slow gait.  Patient underwent further work-up with CT head of brain MRI which were negative. MRI of the cervical thoracic lumbar spine was unremarkable. Lumbar puncture showed negative culture, negative cryptococcus antigen and VDRL negative. He had elevated CSF IgG/albumin ratio consistent with intrathecal antibody production.  Neurology was consulted with recommendations to administer IVIG for 5 days.  Patient continue to have persistent lower extremity weakness and painful paresthesias.Ck 122 on 07/28/20. 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 He has no fevers, western blot negative, consistent with false positive.  Neuro work up included B1 levels, serum ACE and heavy metal screen.  Initially patient required SNF for discharge, during his hospitalization he has been clinically improving and now the recommendations is for home health services.   Plan to completed IV penicillin on 08/12/20, then possible dc home on 08/13/20.   Assessment & Plan:   Principal Problem:   Guillain Barr syndrome (HCC) Active Problems:   AIDS  (acquired immune deficiency syndrome) (HCC)   History of syphilis   Lactic acidosis   Malnutrition of moderate degree   1. 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 Patient has been ambulating in the hallway with improved mobility. Latest PT evaluation recommended home health therapy.   For painful paresthesias will continue withgabapentin to 900 tid,tramadol, methocarbamol and duloxetine.   Continue treatment for neurosyphilis withIVpenicillin for 10 days, #8/10 On thiamine.   2. HIV/ AIDS.On antiretroviral therapywith good toleration. continue with prophylacticbactrim. Follow up as outpatient.   3. Chronic disease anemia.Stable cell count.  4. Hyponatremia, hypomagenesemia and hypokalemia.resolved  5. Moderate calorie protein malnutrition.On Nutritional supplements.   Status is: Inpatient  Remains inpatient appropriate because:IV treatments appropriate due to intensity of illness or inability to take PO   Dispo:  Patient From: Home  Planned Disposition: Skilled Nursing Facility  Expected discharge date: 08/13/20  Medically stable for discharge: No     DVT prophylaxis: Enoxaparin   Code Status:   full  Family Communication:  No family at the bedside, he reports not having close family      Nutrition Status: Nutrition Problem: Moderate Malnutrition Etiology: chronic illness (HIV) Signs/Symptoms: mild fat depletion, severe muscle depletion Interventions: Ensure Enlive (each supplement provides 350kcal and 20 grams of protein), MVI     Consultants:   Neurology   ID   Procedures:    lumbar puncture   Antimicrobials:   PNC   Subjective: Patient has been walking in the hallway, strength and paresthesias improving but not yet back to baseline, no nausea or vomiting, no chest pain or dyspnea.   Objective: Vitals:   08/09/20 1808 08/09/20 2005 08/09/20 2346 08/10/20 0409  BP: 107/71 116/75 107/81 117/65   Pulse: 98 98 99 92  Resp: 18 16 18 20   Temp: 98.4 F (36.9 C) 98.3 F (36.8 C) 98.5 F (36.9 C) 98 F (36.7 C)  TempSrc: Oral Oral Oral   SpO2: 94% 97% 99% 97%  Weight:      Height:        Intake/Output Summary (Last 24 hours) at 08/10/2020 0816 Last data filed at 08/10/2020 0154 Gross per 24 hour  Intake 1137.02 ml  Output 600 ml  Net 537.02 ml   Filed Weights   07/28/20 1352  Weight: 65.8 kg    Examination:   General: Not in pain or dyspnea, deconditioned  Neurology: Awake and alert, non focal  E ENT: no pallor, no icterus, oral mucosa moist Cardiovascular: No JVD. S1-S2 present, No lower extremity edema. Pulmonary: positive breath sounds bilaterally, no wheezing, rhonchi or rales. Gastrointestinal. Abdomen soft and non tender Skin. No rashes Musculoskeletal: no joint deformities/ tender to palpation thighs.      Data Reviewed: I have personally reviewed following labs and imaging studies  CBC: No results for input(s): WBC, NEUTROABS, HGB, HCT, MCV, PLT in the last 168 hours. Basic Metabolic Panel: Recent Labs  Lab 08/05/20 0127  NA 133*  K 4.0  CL 102  CO2 25  GLUCOSE 131*  BUN 11  CREATININE 0.82  CALCIUM 9.3   GFR: Estimated Creatinine Clearance: 111.4 mL/min (by C-G formula based on SCr of 0.82 mg/dL). Liver Function Tests: No results for input(s): AST, ALT, ALKPHOS, BILITOT, PROT, ALBUMIN in the last 168 hours. No results for input(s): LIPASE, AMYLASE in the last 168 hours. No results for input(s): AMMONIA in the last 168 hours. Coagulation Profile: No results for input(s): INR, PROTIME in the last 168 hours. Cardiac Enzymes: Recent Labs  Lab 08/05/20 0127  CKTOTAL 49   BNP (last 3 results) No results for input(s): PROBNP in the last 8760 hours. HbA1C: No results for input(s): HGBA1C in the last 72 hours. CBG: No results for input(s): GLUCAP in the last 168 hours. Lipid Profile: No results for input(s): CHOL, HDL, LDLCALC, TRIG,  CHOLHDL, LDLDIRECT in the last 72 hours. Thyroid Function Tests: No results for input(s): TSH, T4TOTAL, FREET4, T3FREE, THYROIDAB in the last 72 hours. Anemia Panel: No results for input(s): VITAMINB12, FOLATE, FERRITIN, TIBC, IRON, RETICCTPCT in the last 72 hours.    Radiology Studies: I have reviewed all of the imaging during this hospital visit personally     Scheduled Meds: . bictegravir-emtricitabine-tenofovir AF  1 tablet Oral Daily  . DULoxetine  20 mg Oral Daily  . enoxaparin (LOVENOX) injection  40 mg Subcutaneous Q24H  . feeding supplement  237 mL Oral Q24H  . gabapentin  900 mg Oral TID  . methocarbamol  500 mg Oral TID  . multivitamin with minerals  1 tablet Oral Daily  . sulfamethoxazole-trimethoprim  1 tablet Oral Daily  . thiamine injection  100 mg Intravenous Daily   Or  . thiamine  100 mg Oral Daily  . vitamin B-12  1,000 mcg Oral Daily   Continuous Infusions: . penicillin g continuous IV infusion 6 Million Units (08/10/20 08/12/20)     LOS: 13 days        Prescott Truex [id number], MD

## 2020-08-10 NOTE — Progress Notes (Signed)
Physical Therapy Treatment Patient Details Name: Travis Mcgee MRN: [id number] DOB: 10-Feb-1981 Today's Date: 08/10/2020    History of Present Illness Travis Mcgee is a 39 y.o. male with medical history significant of HIV/AIDS had been on HAART.Pt presents to ED with c/o pain and weakness in BLE.  Thought to be secondary to Guillain barre and was started on IVIG.     PT Comments    Pt supine in bed he has been ambulating halls independently.  Focused on LE strengthening and issuing HEP this session. Pt fatigued from activities.  Plan for f/u HHPT.     Follow Up Recommendations  Home health PT     Equipment Recommendations  Rolling walker with 5" wheels    Recommendations for Other Services       Precautions / Restrictions Precautions Precautions: Fall Precaution Comments: Has had multiple falls.  Restrictions Weight Bearing Restrictions: No    Mobility  Bed Mobility                  Transfers                    Ambulation/Gait                 Stairs             Wheelchair Mobility    Modified Rankin (Stroke Patients Only)       Balance                                            Cognition Arousal/Alertness: Awake/alert Behavior During Therapy: WFL for tasks assessed/performed Overall Cognitive Status: Within Functional Limits for tasks assessed                                        Exercises General Exercises - Lower Extremity Ankle Circles/Pumps: AROM;Both;20 reps;Supine Quad Sets: AROM;Both;10 reps;Supine Heel Slides: AROM;Both;10 reps;Supine Hip ABduction/ADduction: AROM;Both;10 reps;Supine Straight Leg Raises: AROM;Both;10 reps;Supine    General Comments        Pertinent Vitals/Pain Pain Assessment: Faces Faces Pain Scale: Hurts even more Pain Location: B LEs Pain Descriptors / Indicators: Pins and needles;Tingling Pain Intervention(s): Monitored during session;Repositioned     Home Living                      Prior Function            PT Goals (current goals can now be found in the care plan section) Acute Rehab PT Goals Patient Stated Goal: to get back to work Potential to Achieve Goals: Fair Progress towards PT goals: Progressing toward goals    Frequency    Min 2X/week      PT Plan Current plan remains appropriate    Co-evaluation              AM-PAC PT "6 Clicks" Mobility   Outcome Measure  Help needed turning from your back to your side while in a flat bed without using bedrails?: None Help needed moving from lying on your back to sitting on the side of a flat bed without using bedrails?: None Help needed moving to and from a bed to a chair (including a wheelchair)?: None Help needed standing up from a chair using your arms (  e.g., wheelchair or bedside chair)?: None Help needed to walk in hospital room?: None Help needed climbing 3-5 steps with a railing? : A Little 6 Click Score: 23    End of Session Equipment Utilized During Treatment: Gait belt Activity Tolerance: Patient limited by pain Patient left: in bed;with call bell/phone within reach Nurse Communication: Mobility status PT Visit Diagnosis: Unsteadiness on feet (R26.81);Muscle weakness (generalized) (M62.81)     Time: 9244-6286 PT Time Calculation (min) (ACUTE ONLY): 15 min  Charges:  $Therapeutic Exercise: 8-22 mins                     Travis Mcgee , PTA Acute Rehabilitation Services Pager [phone] Office [phone]     Travis Mcgee Travis Mcgee 08/10/2020, 12:34 PM

## 2020-08-10 NOTE — Plan of Care (Signed)
  Problem: Health Behavior/Discharge Planning: Goal: Ability to manage health-related needs will improve Outcome: Not Progressing   

## 2020-08-11 DIAGNOSIS — G610 Guillain-Barre syndrome: Secondary | ICD-10-CM | POA: Diagnosis not present

## 2020-08-11 DIAGNOSIS — B20 Human immunodeficiency virus [HIV] disease: Secondary | ICD-10-CM | POA: Diagnosis not present

## 2020-08-11 DIAGNOSIS — Z8619 Personal history of other infectious and parasitic diseases: Secondary | ICD-10-CM | POA: Diagnosis not present

## 2020-08-11 LAB — CBC
HCT: 34.9 % — ABNORMAL LOW (ref 39.0–52.0)
Hemoglobin: 11.9 g/dL — ABNORMAL LOW (ref 13.0–17.0)
MCH: 36.1 pg — ABNORMAL HIGH (ref 26.0–34.0)
MCHC: 34.1 g/dL (ref 30.0–36.0)
MCV: 105.8 fL — ABNORMAL HIGH (ref 80.0–100.0)
Platelets: 322 10*3/uL (ref 150–400)
RBC: 3.3 MIL/uL — ABNORMAL LOW (ref 4.22–5.81)
RDW: 14.7 % (ref 11.5–15.5)
WBC: 7.2 10*3/uL (ref 4.0–10.5)
nRBC: 0 % (ref 0.0–0.2)

## 2020-08-11 LAB — BASIC METABOLIC PANEL
Anion gap: 10 (ref 5–15)
BUN: 10 mg/dL (ref 6–20)
CO2: 21 mmol/L — ABNORMAL LOW (ref 22–32)
Calcium: 9.3 mg/dL (ref 8.9–10.3)
Chloride: 102 mmol/L (ref 98–111)
Creatinine, Ser: 0.68 mg/dL (ref 0.61–1.24)
GFR, Estimated: >60 mL/min (ref >60)
Glucose, Bld: 98 mg/dL (ref 70–99)
Potassium: 4.2 mmol/L (ref 3.5–5.1)
Sodium: 133 mmol/L — ABNORMAL LOW (ref 135–145)

## 2020-08-11 LAB — METHYLMALONIC ACID, SERUM: Methylmalonic Acid, Quantitative: 230 nmol/L (ref 0–378)

## 2020-08-11 NOTE — Plan of Care (Signed)
  Problem: Clinical Measurements: Goal: Will remain free from infection Outcome: Progressing   Problem: Activity: Goal: Risk for activity intolerance will decrease Outcome: Progressing   

## 2020-08-11 NOTE — Progress Notes (Addendum)
PROGRESS NOTE    Travis Mcgee  VOJ:[id number] DOB: [birth year] DOA: 07/28/2020 PCP: Patient, No Pcp Per  As per Dr. Ella Jubilee: Travis Mcgee was admitted to the hospital with working diagnosis of Travis Mcgee syndrome.  39 year old male with significant past medical history for HIV/AIDSand latent syphiliswho presented with bilateral extremity pain. Positive pain for the last 7 to 10 days, associated with paresthesias and weakness. Progressive symptoms with multiple falls. On his initial physical examination blood pressure 118/86, heart rate 110, respiratory rate 36, oxygen saturation 100%. His lungs were clear to auscultation, heart S1-S2, present rhythmic, soft abdomen, no lower extremity edema. His strength was preserved upper extremities, he had a slow gait.  Patient underwent further work-up with CT head of brain MRI which were negative. MRI of the cervical thoracic lumbar spine was unremarkable. Lumbar puncture showed negative culture, negative cryptococcus antigen and VDRL negative. He had elevated CSF IgG/albumin ratio consistent with intrathecal antibody production.  Neurology was consulted with recommendations to administer IVIG for 5 days.  Patient continue to have persistent lower extremity weakness and painful paresthesias.Ck 122 on 07/28/20. 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 He has no fevers, western blot negative, consistent with false positive.  Neuro work up included B1 levels, serum ACE and heavy metal screen.  Initially patient required SNF for discharge, during his hospitalization he has been clinically improving and now the recommendations is for home health services.   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 PT recs home health. Continue on thiamine, gabapentin, tramadol, methocarbamol & duloxetine. Continue on IV penicillin for 10 days, day#9/10.  HIV/ AIDS: continue on antiviral therapy & continue on prophylactic bactrim as per ID.   ACD: no need for a transfusion at this time. Will continue to monitor   Hyponatremia: stable from day prior.   Hypokalemia: WNL today. Will continue to monitor   Moderate calorie protein malnutrition: continue on nutritional supplements.    DVT prophylaxis: lovenox  Code Status: full  Family Communication:  Disposition Plan: likely d/c w/ home health    Consultants:   ID   Procedures: LP   Antimicrobials: penicillin G, bactrim    Subjective: Pt c/o b/l leg pain   Objective: Vitals:   08/10/20 1744 08/10/20 1954 08/11/20 0004 08/11/20 0421  BP: 127/79 113/79 113/76 104/71  Pulse: 96 99 98 95  Resp: 18 18 15 16   Temp: 98.4 F (36.9 C) 98.4 F (36.9 C) 98.3 F (36.8 C) 98.1 F (36.7 C)  TempSrc: Oral Oral Oral Oral  SpO2: 100% 100% 98% 100%  Weight:      Height:        Intake/Output Summary (Last 24 hours) at 08/11/2020 0744 Last data filed at 08/11/2020 0011 Gross per 24 hour  Intake 1289.22 ml  Output 950 ml  Net 339.22 ml   Filed Weights   07/28/20 1352  Weight: 65.8 kg    Examination:  General exam: Appears calm and comfortable  Respiratory system: Clear to auscultation. No rales, rhonchi  Cardiovascular system: S1 & S2 +. No rubs, gallops or  clicks.  Gastrointestinal system: Abdomen is nondistended, soft and nontender.  Normal bowel sounds heard. Central nervous system: Alert and oriented. Moves all 4 extremities  Psychiatry: Judgement and insight appear normal. Flat mood and affect     Data Reviewed: I have  personally reviewed following labs and imaging studies  CBC: No results for input(s): WBC, NEUTROABS, HGB, HCT, MCV, PLT in the last 168 hours. Basic Metabolic Panel: Recent Labs  Lab 08/05/20 0127  NA 133*  K 4.0  CL 102  CO2 25  GLUCOSE 131*  BUN 11  CREATININE 0.82  CALCIUM 9.3   GFR: Estimated Creatinine Clearance: 111.4 mL/min (by C-G formula based on SCr of 0.82 mg/dL). Liver Function Tests: No results for input(s): AST, ALT, ALKPHOS, BILITOT, PROT, ALBUMIN in the last 168 hours. No results for input(s): LIPASE, AMYLASE in the last 168 hours. No results for input(s): AMMONIA in the last 168 hours. Coagulation Profile: No results for input(s): INR, PROTIME in the last 168 hours. Cardiac Enzymes: Recent Labs  Lab 08/05/20 0127  CKTOTAL 49   BNP (last 3 results) No results for input(s): PROBNP in the last 8760 hours. HbA1C: No results for input(s): HGBA1C in the last 72 hours. CBG: No results for input(s): GLUCAP in the last 168 hours. Lipid Profile: No results for input(s): CHOL, HDL, LDLCALC, TRIG, CHOLHDL, LDLDIRECT in the last 72 hours. Thyroid Function Tests: No results for input(s): TSH, T4TOTAL, FREET4, T3FREE, THYROIDAB in the last 72 hours. Anemia Panel: No results for input(s): VITAMINB12, FOLATE, FERRITIN, TIBC, IRON, RETICCTPCT in the last 72 hours. Sepsis Labs: No results for input(s): PROCALCITON, LATICACIDVEN in the last 168 hours.  No results found for this or any previous visit (from the past 240 hour(s)).       Radiology Studies: No results found.      Scheduled Meds: . bictegravir-emtricitabine-tenofovir AF  1 tablet Oral Daily  . DULoxetine  20 mg Oral Daily  . enoxaparin (LOVENOX) injection  40 mg Subcutaneous Q24H  . feeding supplement  237 mL Oral Q24H  . gabapentin  900 mg Oral TID  . methocarbamol  500 mg Oral TID  . multivitamin with minerals  1 tablet Oral Daily  . sulfamethoxazole-trimethoprim  1 tablet Oral Daily  .  thiamine injection  100 mg Intravenous Daily   Or  . thiamine  100 mg Oral Daily  . vitamin B-12  1,000 mcg Oral Daily   Continuous Infusions: . penicillin g continuous IV infusion 6 Million Units (08/11/20 0532)     LOS: 14 days    Time spent: 33 mins     Charise Killian, MD [facility] Pager [phone]  If 7PM-7AM, please contact night-coverage [email or web address] 08/11/2020, 7:44 AM

## 2020-08-12 DIAGNOSIS — G610 Guillain-Barre syndrome: Secondary | ICD-10-CM | POA: Diagnosis not present

## 2020-08-12 LAB — VITAMIN B12: Vitamin B-12: 682 pg/mL (ref 180–914)

## 2020-08-12 LAB — TSH: TSH: 5.19 u[IU]/mL — ABNORMAL HIGH (ref 0.350–4.500)

## 2020-08-12 LAB — FOLATE: Folate: 9.9 ng/mL (ref >5.9)

## 2020-08-12 LAB — T4, FREE: Free T4: 0.56 ng/dL — ABNORMAL LOW (ref 0.61–1.12)

## 2020-08-12 NOTE — Progress Notes (Signed)
PROGRESS NOTE    Travis Mcgee  HYW:[id number] DOB: 1981-03-01 DOA: 07/28/2020 PCP: Patient, No Pcp Per  As per Dr. Ella Jubilee: [name] was admitted to the hospital with working diagnosis of Travis Mcgee syndrome.  39 year old male with significant past medical history for HIV/AIDSand latent syphiliswho presented with bilateral extremity pain. Positive pain for the last 7 to 10 days, associated with paresthesias and weakness. Progressive symptoms with multiple falls. On his initial physical examination blood pressure 118/86, heart rate 110, respiratory rate 36, oxygen saturation 100%. His lungs were clear to auscultation, heart S1-S2, present rhythmic, soft abdomen, no lower extremity edema. His strength was preserved upper extremities, he had a slow gait.  Patient underwent further work-up with CT head of brain MRI which were negative. MRI of the cervical thoracic lumbar spine was unremarkable. Lumbar puncture showed negative culture, negative cryptococcus antigen and VDRL negative. He had elevated CSF IgG/albumin ratio consistent with intrathecal antibody production.  Neurology was consulted with recommendations to administer IVIG for 5 days.  Patient continue to have persistent lower extremity weakness and painful paresthesias.Ck 122 on 07/28/20. 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 He has no fevers, western blot negative, consistent with false positive.  Neuro work up included B1 levels, serum ACE and heavy metal screen.  Initially patient required SNF for discharge, during his hospitalization he has been clinically improving and now the recommendations is for home health services.   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 PT recs home health. Continue on thiamine, gabapentin, tramadol, methocarbamol & duloxetine. Continue on IV penicillin for 10 days, day#10/10.Today is last day for PCN.   HIV/ AIDS: continue on antiviral therapy & continue on prophylactic bactrim as per ID.  Appreciate ID management and care.   ACD: no need for a transfusion at this time. Will continue to monitor. Will obtain b12/folate and TFT's.   Hyponatremia: stable from day prior.   Hypokalemia: WNL 08/11/2020, Will continue to monitor. repeat labs in am.   Moderate calorie protein malnutrition: continue on nutritional supplements.    DVT prophylaxis: lovenox  Code Status: full  Family Communication: None at bedside.  Disposition Plan: likely d/c w/ home health    Consultants:   ID.  Neurology.  Procedures:  -Lumbar Puncture.  Antimicrobials:  Anti-infectives (From admission, onward)   Start     Dose/Rate Route Frequency Ordered Stop   08/03/20 1500  penicillin G potassium 6 Million Units in dextrose 5 % 500 mL continuous infusion        6 Million Units 41.7 mL/hr over 12 Hours Intravenous Every 12 hours 08/03/20 1407     08/03/20 1200  penicillin G potassium 2 Million Units in dextrose 5 % 50 mL IVPB  Status:  Discontinued        2 Million Units 100 mL/hr over 30 Minutes Intravenous Every 4 hours 08/03/20 1136 08/03/20 1407   07/29/20 2200  anidulafungin (ERAXIS) 100 mg in sodium chloride 0.9 % 100 mL IVPB  Status:  Discontinued       "Followed by" Linked Group Details  100 mg 78 mL/hr over 100 Minutes Intravenous Every 24 hours 07/28/20 2037 07/28/20 2127   07/29/20 1715  sulfamethoxazole-trimethoprim (BACTRIM DS) 800-160 MG per tablet 1 tablet        1 tablet Oral Daily 07/29/20 1701     07/29/20 0900   vancomycin (VANCOREADY) IVPB 1250 mg/250 mL  Status:  Discontinued        1,250 mg 166.7 mL/hr over 90 Minutes Intravenous Every 12 hours 07/28/20 2049 07/29/20 1142   07/28/20 2300  acyclovir (ZOVIRAX) 660 mg in dextrose 5 % 100 mL IVPB  Status:  Discontinued        10 mg/kg  65.8 kg 113.2 mL/hr over 60 Minutes Intravenous Every 8 hours 07/28/20 2126 07/28/20 2129   07/28/20 2200  ampicillin (OMNIPEN) 2 g in sodium chloride 0.9 % 100 mL IVPB  Status:  Discontinued        2 g 300 mL/hr over 20 Minutes Intravenous Every 4 hours 07/28/20 2027 07/28/20 2127   07/28/20 2200  ceFEPIme (MAXIPIME) 2 g in sodium chloride 0.9 % 100 mL IVPB  Status:  Discontinued        2 g 200 mL/hr over 30 Minutes Intravenous Every 8 hours 07/28/20 2027 07/28/20 2127   07/28/20 2200  anidulafungin (ERAXIS) 200 mg in sodium chloride 0.9 % 200 mL IVPB  Status:  Discontinued       "Followed by" Linked Group Details   200 mg 78 mL/hr over 200 Minutes Intravenous  Once 07/28/20 2037 07/28/20 2127   07/28/20 2200  cefTRIAXone (ROCEPHIN) 2 g in sodium chloride 0.9 % 100 mL IVPB  Status:  Discontinued        2 g 200 mL/hr over 30 Minutes Intravenous Every 12 hours 07/28/20 2142 07/29/20 1142   07/28/20 2200  bictegravir-emtricitabine-tenofovir AF (BIKTARVY) 50-200-25 MG per tablet 1 tablet        1 tablet Oral Daily 07/28/20 2148     07/28/20 2130  cefTRIAXone (ROCEPHIN) 2 g in sodium chloride 0.9 % 100 mL IVPB  Status:  Discontinued        2 g 200 mL/hr over 30 Minutes Intravenous  Once 07/28/20 2130 07/28/20 2142   07/28/20 2100  vancomycin (VANCOREADY) IVPB 1500 mg/300 mL        1,500 mg 150 mL/hr over 120 Minutes Intravenous  Once 07/28/20 2027 07/29/20 0231   07/28/20 2030  anidulafungin (ERAXIS) 200 mg in sodium chloride 0.9 % 200 mL IVPB  Status:  Discontinued        200 mg 78 mL/hr over 200 Minutes Intravenous Every 24 hours 07/28/20 2027 07/28/20 2036   07/28/20 2030  metroNIDAZOLE (FLAGYL) IVPB 500 mg   Status:  Discontinued        500 mg 100 mL/hr over 60 Minutes Intravenous  Once 07/28/20 2027 07/28/20 2127      Subjective: Pt c/o b/l leg pain  08/12/2020: Pt is alert,awake and eating his breakfast.  Denies any complaints but is interested in getting help at home. States he is feeling better.    Objective: Vitals:   08/11/20 2006 08/11/20 2322 08/12/20 0414 08/12/20 0809  BP: 114/86 114/82 101/75 113/77  Pulse: 89 84 80 96  Resp: 17 18 19 18   Temp: 97.7 F (36.5 C) 98.1 F (36.7 C) 98.5 F (36.9 C) 97.8 F (36.6 C)  TempSrc: Oral Oral Oral Oral  SpO2: 100% 100% 100% 94%  Weight:      Height:  Intake/Output Summary (Last 24 hours) at 08/12/2020 0836 Last data filed at 08/12/2020 0825 Gross per 24 hour  Intake --  Output 2950 ml  Net -2950 ml   Filed Weights   07/28/20 1352  Weight: 65.8 kg    Examination: General exam: Appears calm and comfortable  Respiratory system: Clear to auscultation. No rales, rhonchi  Cardiovascular system: S1 & S2 +. No rubs, gallops or clicks.  Gastrointestinal system: Abdomen is nondistended, soft and nontender.  Normal bowel sounds heard. Central nervous system: Alert and oriented. Moves all 4 extremities  Psychiatry: Judgement and insight appear normal. Flat mood and affect   Data Reviewed: I have personally reviewed following labs and imaging studies  CBC: Recent Labs  Lab 08/11/20 0904  WBC 7.2  HGB 11.9*  HCT 34.9*  MCV 105.8*  PLT 322   Basic Metabolic Panel: Recent Labs  Lab 08/11/20 0904  NA 133*  K 4.2  CL 102  CO2 21*  GLUCOSE 98  BUN 10  CREATININE 0.68  CALCIUM 9.3   GFR: Estimated Creatinine Clearance: 114.2 mL/min (by C-G formula based on SCr of 0.68 mg/dL).  Radiology Studies: No results found.  Scheduled Meds: . bictegravir-emtricitabine-tenofovir AF  1 tablet Oral Daily  . DULoxetine  20 mg Oral Daily  . enoxaparin (LOVENOX) injection  40 mg Subcutaneous Q24H  . feeding supplement   237 mL Oral Q24H  . gabapentin  900 mg Oral TID  . methocarbamol  500 mg Oral TID  . multivitamin with minerals  1 tablet Oral Daily  . sulfamethoxazole-trimethoprim  1 tablet Oral Daily  . thiamine injection  100 mg Intravenous Daily   Or  . thiamine  100 mg Oral Daily  . vitamin B-12  1,000 mcg Oral Daily   Continuous Infusions: . penicillin g continuous IV infusion 6 Million Units (08/12/20 0318)     LOS: 15 days   Time spent: 40 mins   Gertha Calkin, MD [facility] Pager [phone] If 7PM-7AM, please contact night-coverage [email or web address] 08/12/2020, 8:36 AM

## 2020-08-12 NOTE — Plan of Care (Signed)
Continue to monitor

## 2020-08-13 DIAGNOSIS — G610 Guillain-Barre syndrome: Secondary | ICD-10-CM | POA: Diagnosis not present

## 2020-08-13 MED ORDER — LEVOTHYROXINE SODIUM 25 MCG PO TABS
25.0000 ug | ORAL_TABLET | Freq: Every day | ORAL | Status: DC
  Administered 2020-08-14 – 2020-08-18 (×5): 25 ug via ORAL
  Filled 2020-08-13 (×6): qty 1

## 2020-08-13 NOTE — Progress Notes (Signed)
Occupational Therapy Treatment Patient Details Name: Travis Mcgee MRN: [id number] DOB: October 14, 1981 Today's Date: 08/13/2020    History of present illness Travis Mcgee is a 39 y.o. male with medical history significant of HIV/AIDS had been on HAART.Pt presents to ED with c/o pain and weakness in BLE.  Thought to be secondary to Guillain barre and was started on IVIG.    OT comments  Pt making steady progress towards OT goals this session. Pt found walking in halls upon OTA arrival with pt holding on to IV pole. Trialed use of single point cane with good success. Pt able to ambulate greater than a household distance with SPC MOD I. Issued pt written HEP with level 2 theraband with pt able to return demo all therex with good carryover. Pt completed therex as indicated below with no c/o increased pain. Pt would continue to benefit from skilled occupational therapy while admitted and after d/c to address the below listed limitations in order to improve overall functional mobility and facilitate independence with BADL participation. DC plan remains appropriate, will follow acutely per POC.     Follow Up Recommendations  Home health OT    Equipment Recommendations       Recommendations for Other Services      Precautions / Restrictions Precautions Precautions: Fall Precaution Comments: Has had multiple falls.  Restrictions Weight Bearing Restrictions: No       Mobility Bed Mobility               General bed mobility comments: pt walking halls upon arrival  Transfers Overall transfer level: Modified independent Equipment used: Straight cane Transfers: Sit to/from Stand Sit to Stand: Modified independent (Device/Increase time)         General transfer comment: no physical assist needed    Balance Overall balance assessment: Needs assistance Sitting-balance support: No upper extremity supported Sitting balance-Leahy Scale: Good     Standing balance support: During  functional activity;Single extremity supported Standing balance-Leahy Scale: Fair Standing balance comment: uses cane or IV pole when ambulating, was able to walk short distance with no AD with gross supervision with no LOB                           ADL either performed or assessed with clinical judgement   ADL Overall ADL's : Needs assistance/impaired                         Toilet Transfer: Modified Independent Toilet Transfer Details (indicate cue type and reason): simualted via functional mobiilty with SPC and using IV pole         Functional mobility during ADLs: Modified independent General ADL Comments: pt able to complete functional mobility MOD I with use of cane, issued pt level 3 theraband and written HEP to increase BUE strength for higher level BADLs and functional mobility     Vision       Perception     Praxis      Cognition Arousal/Alertness: Awake/alert Behavior During Therapy: WFL for tasks assessed/performed Overall Cognitive Status: Within Functional Limits for tasks assessed                                 General Comments: overall WFL for simple tasks        Exercises General Exercises - Upper Extremity Shoulder Flexion: AROM;Both;5 reps;Seated;Theraband Theraband Level (  Shoulder Flexion): Level 2 (Red) Shoulder Extension: AROM;Both;5 reps;Seated;Theraband Theraband Level (Shoulder Extension): Level 2 (Red) Shoulder Horizontal ABduction: AROM;Both;5 reps;Seated Shoulder Horizontal ADduction: AROM;Both;5 reps;Seated Elbow Flexion: AROM;Both;5 reps;Seated;Theraband Theraband Level (Elbow Flexion): Level 2 (Red) Elbow Extension: AROM;Both;5 reps;Seated;Theraband Theraband Level (Elbow Extension): Level 2 (Red)   Shoulder Instructions       General Comments issued pt level 2 theraband and written HEP    Pertinent Vitals/ Pain       Pain Assessment: Faces Faces Pain Scale: Hurts a little bit Pain Location:  B LEs Pain Descriptors / Indicators: Pins and needles;Tingling;Numbness  Home Living                                          Prior Functioning/Environment              Frequency  Min 1X/week        Progress Toward Goals  OT Goals(current goals can now be found in the care plan section)  Progress towards OT goals: Progressing toward goals  Acute Rehab OT Goals Patient Stated Goal: to get back to work OT Goal Formulation: With patient Time For Goal Achievement: 08/16/20 Potential to Achieve Goals: Good  Plan Discharge plan remains appropriate;Frequency remains appropriate    Co-evaluation                 AM-PAC OT "6 Clicks" Daily Activity     Outcome Measure   Help from another person eating meals?: None Help from another person taking care of personal grooming?: None Help from another person toileting, which includes using toliet, bedpan, or urinal?: None Help from another person bathing (including washing, rinsing, drying)?: A Little Help from another person to put on and taking off regular upper body clothing?: None Help from another person to put on and taking off regular lower body clothing?: A Little 6 Click Score: 22    End of Session Equipment Utilized During Treatment: Other (comment) (IV pole; SPC)  OT Visit Diagnosis: Unsteadiness on feet (R26.81);Muscle weakness (generalized) (M62.81);Pain   Activity Tolerance Patient tolerated treatment well   Patient Left in bed;with call bell/phone within reach   Nurse Communication Mobility status        Time: 3419-3790 OT Time Calculation (min): 25 min  Charges: OT General Charges $OT Visit: 1 Visit OT Treatments $Therapeutic Activity: 8-22 mins $Therapeutic Exercise: 8-22 mins  Audery Amel., COTA/L Acute Rehabilitation Services [phone]    Angelina Pih 08/13/2020, 4:03 PM

## 2020-08-13 NOTE — Progress Notes (Signed)
PROGRESS NOTE    Travis Mcgee  WUX:[id number] DOB: Aug 15, 1981 DOA: 07/28/2020 PCP: Patient, No Pcp Per  As per Dr. Ella Jubilee: Travis Mcgee was admitted to the hospital with working diagnosis of Travis Mcgee syndrome.  38 year old male with significant past medical history for HIV/AIDSand latent syphiliswho presented with bilateral extremity pain. Positive pain for the last 7 to 10 days, associated with paresthesias and weakness. Progressive symptoms with multiple falls. On his initial physical examination blood pressure 118/86, heart rate 110, respiratory rate 36, oxygen saturation 100%. His lungs were clear to auscultation, heart S1-S2, present rhythmic, soft abdomen, no lower extremity edema. His strength was preserved upper extremities, he had a slow gait.  Patient underwent further work-up with CT head of brain MRI which were negative. MRI of the cervical thoracic lumbar spine was unremarkable. Lumbar puncture showed negative culture, negative cryptococcus antigen and VDRL negative. He had elevated CSF IgG/albumin ratio consistent with intrathecal antibody production.  Neurology was consulted with recommendations to administer IVIG for 5 days.  Patient continue to have persistent lower extremity weakness and painful paresthesias.Ck 122 on 07/28/20. 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 He has no fevers, western blot negative, consistent with false positive.  Neuro work up included B1 levels, serum ACE and heavy metal screen.  Initially patient required SNF for discharge, during his hospitalization he has been clinically improving and now the recommendations is for home health services.   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 PT recs home health. Continue on thiamine, gabapentin, tramadol, methocarbamol & duloxetine. Continue on IV penicillin for 10 days, day#10/10.Today is last day for PCN.pt to be d/c in am once d/c planning is complete.Labs in am.  HIV/ AIDS: continue on antiviral therapy & continue on prophylactic bactrim as per ID.  Appreciate ID management and care.   ACD: no need for a transfusion at this time. Will continue to monitor. Will obtain b12/folate and TFT's.   Hyponatremia: stable from day prior.   Hypokalemia: WNL 08/11/2020, Will continue to monitor. repeat labs in am.   Moderate calorie protein malnutrition: continue on nutritional supplements.    DVT prophylaxis: lovenox  Code Status: full  Family Communication: None at bedside.  Disposition Plan: likely d/c w/ home health    Consultants:   ID.  Neurology.  Procedures:  -Lumbar Puncture.  Antimicrobials:  Anti-infectives (From admission, onward)   Start     Dose/Rate Route Frequency Ordered Stop   08/03/20 1500  penicillin G potassium 6 Million Units in dextrose 5 % 500 mL continuous infusion        6 Million Units 41.7 mL/hr over 12 Hours Intravenous Every 12 hours 08/03/20 1407 08/13/20 1459   08/03/20 1200  penicillin G potassium 2 Million Units in dextrose 5 % 50 mL IVPB  Status:  Discontinued        2 Million Units 100 mL/hr over 30 Minutes Intravenous Every 4 hours 08/03/20 1136 08/03/20 1407   07/29/20 2200  anidulafungin (ERAXIS) 100 mg in sodium chloride 0.9 % 100 mL IVPB  Status:  Discontinued       "  Followed by" Linked Group Details   100 mg 78 mL/hr over 100 Minutes Intravenous Every 24 hours 07/28/20 2037 07/28/20 2127   07/29/20 1715  sulfamethoxazole-trimethoprim (BACTRIM DS) 800-160 MG per tablet 1  tablet        1 tablet Oral Daily 07/29/20 1701     07/29/20 0900  vancomycin (VANCOREADY) IVPB 1250 mg/250 mL  Status:  Discontinued        1,250 mg 166.7 mL/hr over 90 Minutes Intravenous Every 12 hours 07/28/20 2049 07/29/20 1142   07/28/20 2300  acyclovir (ZOVIRAX) 660 mg in dextrose 5 % 100 mL IVPB  Status:  Discontinued        10 mg/kg  65.8 kg 113.2 mL/hr over 60 Minutes Intravenous Every 8 hours 07/28/20 2126 07/28/20 2129   07/28/20 2200  ampicillin (OMNIPEN) 2 g in sodium chloride 0.9 % 100 mL IVPB  Status:  Discontinued        2 g 300 mL/hr over 20 Minutes Intravenous Every 4 hours 07/28/20 2027 07/28/20 2127   07/28/20 2200  ceFEPIme (MAXIPIME) 2 g in sodium chloride 0.9 % 100 mL IVPB  Status:  Discontinued        2 g 200 mL/hr over 30 Minutes Intravenous Every 8 hours 07/28/20 2027 07/28/20 2127   07/28/20 2200  anidulafungin (ERAXIS) 200 mg in sodium chloride 0.9 % 200 mL IVPB  Status:  Discontinued       "Followed by" Linked Group Details   200 mg 78 mL/hr over 200 Minutes Intravenous  Once 07/28/20 2037 07/28/20 2127   07/28/20 2200  cefTRIAXone (ROCEPHIN) 2 g in sodium chloride 0.9 % 100 mL IVPB  Status:  Discontinued        2 g 200 mL/hr over 30 Minutes Intravenous Every 12 hours 07/28/20 2142 07/29/20 1142   07/28/20 2200  bictegravir-emtricitabine-tenofovir AF (BIKTARVY) 50-200-25 MG per tablet 1 tablet        1 tablet Oral Daily 07/28/20 2148     07/28/20 2130  cefTRIAXone (ROCEPHIN) 2 g in sodium chloride 0.9 % 100 mL IVPB  Status:  Discontinued        2 g 200 mL/hr over 30 Minutes Intravenous  Once 07/28/20 2130 07/28/20 2142   07/28/20 2100  vancomycin (VANCOREADY) IVPB 1500 mg/300 mL        1,500 mg 150 mL/hr over 120 Minutes Intravenous  Once 07/28/20 2027 07/29/20 0231   07/28/20 2030  anidulafungin (ERAXIS) 200 mg in sodium chloride 0.9 % 200 mL IVPB  Status:  Discontinued        200 mg 78 mL/hr over 200 Minutes Intravenous Every 24 hours 07/28/20 2027  07/28/20 2036   07/28/20 2030  metroNIDAZOLE (FLAGYL) IVPB 500 mg  Status:  Discontinued        500 mg 100 mL/hr over 60 Minutes Intravenous  Once 07/28/20 2027 07/28/20 2127      Subjective: Pt c/o b/l leg pain  08/12/2020: Pt is alert,awake and eating his breakfast.  Denies any complaints but is interested in getting help at home. States he is feeling better.    Objective: Vitals:   08/12/20 2321 08/13/20 0451 08/13/20 0810 08/13/20 1210  BP: 111/76 109/77 128/89 112/77  Pulse: 96 100 98 (!) 107  Resp: 20 20 20 16   Temp: 98.9 F (37.2 C) 98.9 F (37.2 C) 98.1 F (36.7 C) 98.3 F (36.8 C)  TempSrc: Oral Oral Oral Oral  SpO2: 93% 95% 96% 95%  Weight:  Height:        Intake/Output Summary (Last 24 hours) at 08/13/2020 1552 Last data filed at 08/13/2020 0900 Gross per 24 hour  Intake 480 ml  Output 725 ml  Net -245 ml   Filed Weights   07/28/20 1352  Weight: 65.8 kg    Examination: General exam: Appears calm and comfortable  Respiratory system: Clear to auscultation. No rales, rhonchi  Cardiovascular system: S1 & S2 +. No rubs, gallops or clicks.  Gastrointestinal system: Abdomen is nondistended, soft and nontender.  Normal bowel sounds heard. Central nervous system: Alert and oriented. Moves all 4 extremities  Psychiatry: Judgement and insight appear normal. Flat mood and affect   Data Reviewed: I have personally reviewed following labs and imaging studies  CBC: Recent Labs  Lab 08/11/20 0904  WBC 7.2  HGB 11.9*  HCT 34.9*  MCV 105.8*  PLT 322   Basic Metabolic Panel: Recent Labs  Lab 08/11/20 0904  NA 133*  K 4.2  CL 102  CO2 21*  GLUCOSE 98  BUN 10  CREATININE 0.68  CALCIUM 9.3   GFR: Estimated Creatinine Clearance: 114.2 mL/min (by C-G formula based on SCr of 0.68 mg/dL).  Radiology Studies: No results found.  Scheduled Meds: . bictegravir-emtricitabine-tenofovir AF  1 tablet Oral Daily  . DULoxetine  20 mg Oral Daily  .  enoxaparin (LOVENOX) injection  40 mg Subcutaneous Q24H  . feeding supplement  237 mL Oral Q24H  . gabapentin  900 mg Oral TID  . methocarbamol  500 mg Oral TID  . multivitamin with minerals  1 tablet Oral Daily  . sulfamethoxazole-trimethoprim  1 tablet Oral Daily  . thiamine injection  100 mg Intravenous Daily   Or  . thiamine  100 mg Oral Daily  . vitamin B-12  1,000 mcg Oral Daily   Continuous Infusions:    LOS: 16 days   Time spent: 20 mins   Gertha Calkin, MD [facility] Pager [phone] If 7PM-7AM, please contact night-coverage [email or web address] 08/13/2020, 3:52 PM

## 2020-08-14 ENCOUNTER — Other Ambulatory Visit (HOSPITAL_COMMUNITY): Payer: Self-pay | Admitting: Internal Medicine

## 2020-08-14 ENCOUNTER — Inpatient Hospital Stay (HOSPITAL_COMMUNITY): Payer: No Typology Code available for payment source

## 2020-08-14 DIAGNOSIS — G610 Guillain-Barre syndrome: Secondary | ICD-10-CM | POA: Diagnosis not present

## 2020-08-14 LAB — CBC WITH DIFFERENTIAL/PLATELET
Abs Immature Granulocytes: 0.02 10*3/uL (ref 0.00–0.07)
Basophils Absolute: 0.2 10*3/uL — ABNORMAL HIGH (ref 0.0–0.1)
Basophils Relative: 2 %
Eosinophils Absolute: 0.6 10*3/uL — ABNORMAL HIGH (ref 0.0–0.5)
Eosinophils Relative: 7 %
HCT: 38.7 % — ABNORMAL LOW (ref 39.0–52.0)
Hemoglobin: 12.8 g/dL — ABNORMAL LOW (ref 13.0–17.0)
Immature Granulocytes: 0 %
Lymphocytes Relative: 42 %
Lymphs Abs: 3.5 10*3/uL (ref 0.7–4.0)
MCH: 35 pg — ABNORMAL HIGH (ref 26.0–34.0)
MCHC: 33.1 g/dL (ref 30.0–36.0)
MCV: 105.7 fL — ABNORMAL HIGH (ref 80.0–100.0)
Monocytes Absolute: 1 10*3/uL (ref 0.1–1.0)
Monocytes Relative: 12 %
Neutro Abs: 3.1 10*3/uL (ref 1.7–7.7)
Neutrophils Relative %: 37 %
Platelets: 358 10*3/uL (ref 150–400)
RBC: 3.66 MIL/uL — ABNORMAL LOW (ref 4.22–5.81)
RDW: 13.9 % (ref 11.5–15.5)
WBC: 8.4 10*3/uL (ref 4.0–10.5)
nRBC: 0 % (ref 0.0–0.2)

## 2020-08-14 LAB — COMPREHENSIVE METABOLIC PANEL
ALT: 50 U/L — ABNORMAL HIGH (ref 0–44)
AST: 37 U/L (ref 15–41)
Albumin: 3.3 g/dL — ABNORMAL LOW (ref 3.5–5.0)
Alkaline Phosphatase: 151 U/L — ABNORMAL HIGH (ref 38–126)
Anion gap: 10 (ref 5–15)
BUN: 9 mg/dL (ref 6–20)
CO2: 25 mmol/L (ref 22–32)
Calcium: 9.6 mg/dL (ref 8.9–10.3)
Chloride: 101 mmol/L (ref 98–111)
Creatinine, Ser: 0.71 mg/dL (ref 0.61–1.24)
GFR, Estimated: >60 mL/min (ref >60)
Glucose, Bld: 113 mg/dL — ABNORMAL HIGH (ref 70–99)
Potassium: 3.5 mmol/L (ref 3.5–5.1)
Sodium: 136 mmol/L (ref 135–145)
Total Bilirubin: 0.4 mg/dL (ref 0.3–1.2)
Total Protein: 9.9 g/dL — ABNORMAL HIGH (ref 6.5–8.1)

## 2020-08-14 IMAGING — US US SOFT TISSUE
1 series · 14 of 16 positions shown · non-contrast
Comparison: CT [DATE]

CLINICAL DATA: Left armpit swelling

EXAM:
ULTRASOUND OF HEAD/NECK SOFT TISSUES
TECHNIQUE: Ultrasound examination of the head and neck soft tissues was
performed in the area of clinical concern.

[Series 1: us chest soft tissue · 18 acquisitions, 14 frames shown]
[im 1/18]
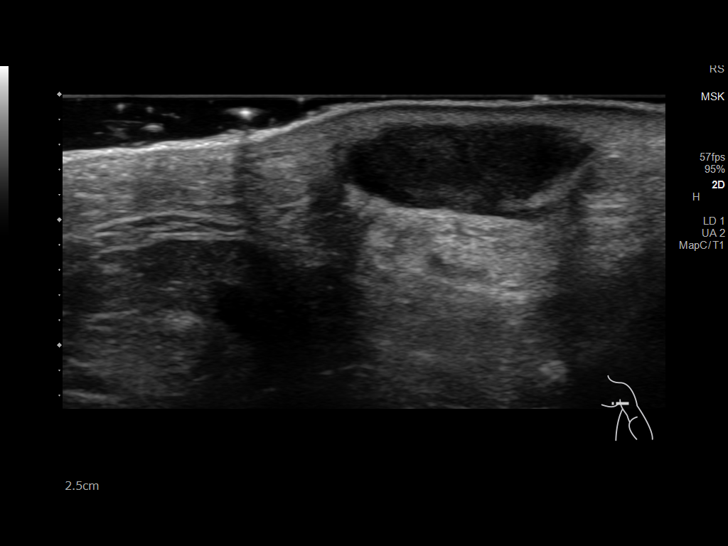
[im 2/18]
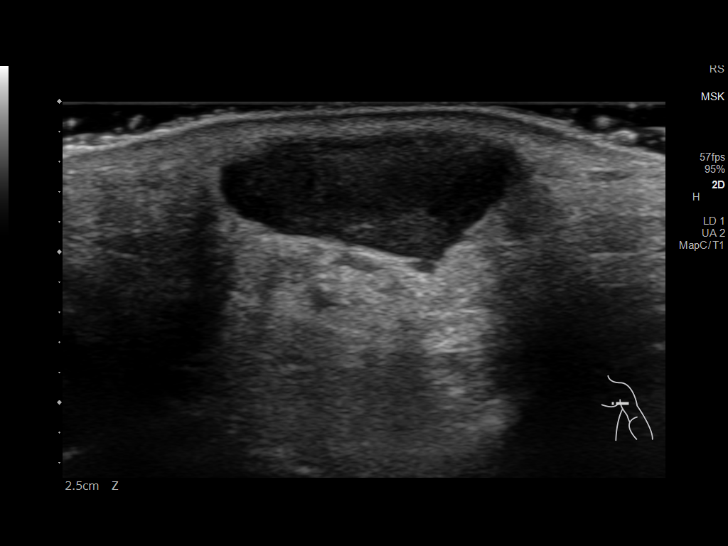
[im 3/18]
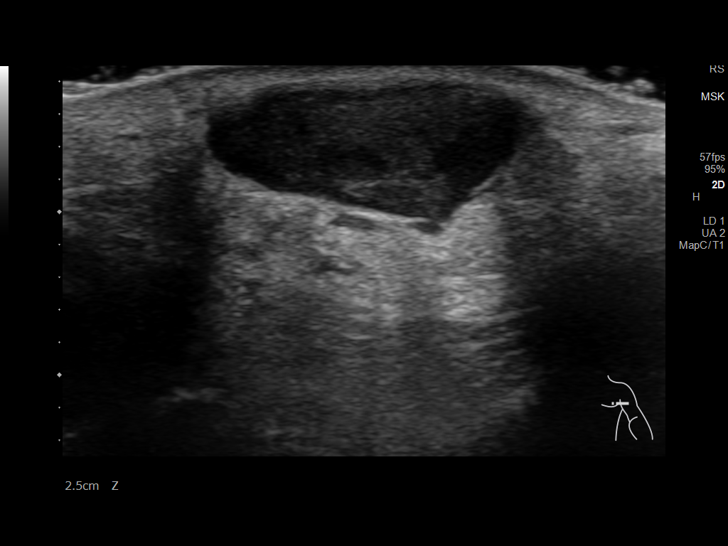
[im 5/18]
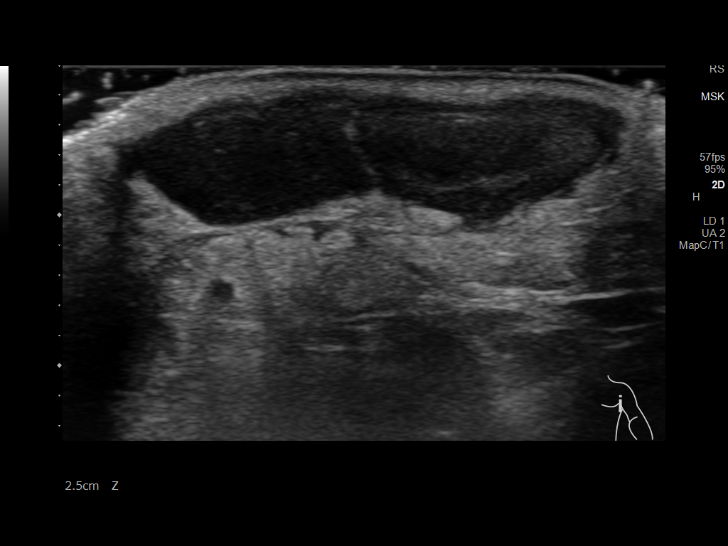
[im 6/18]
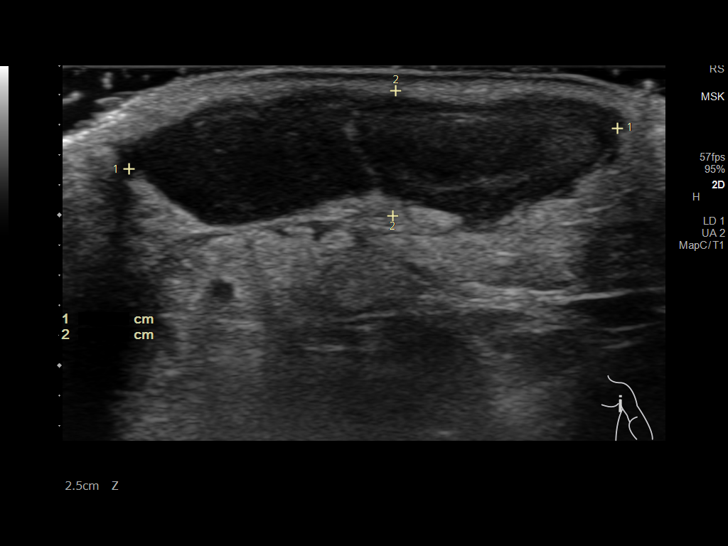
[im 7/18]
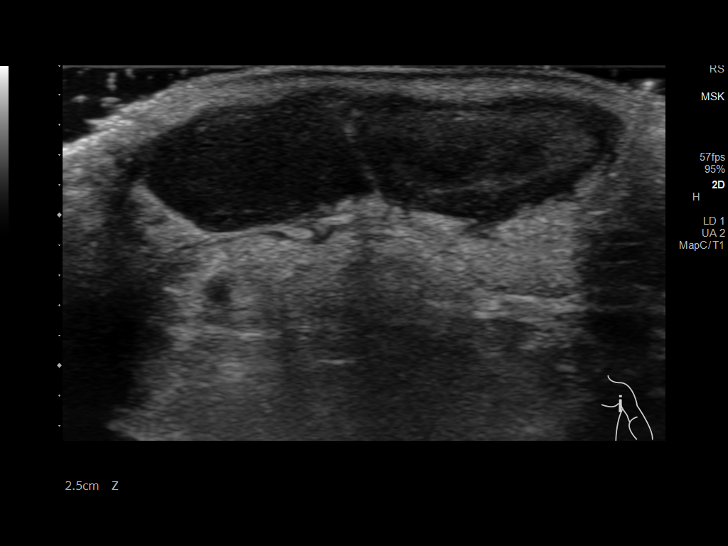
[im 8/18]
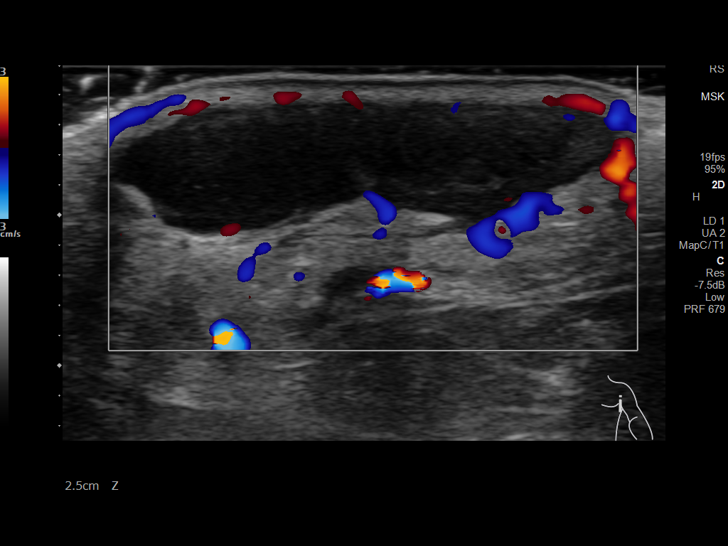
[im 10/18]
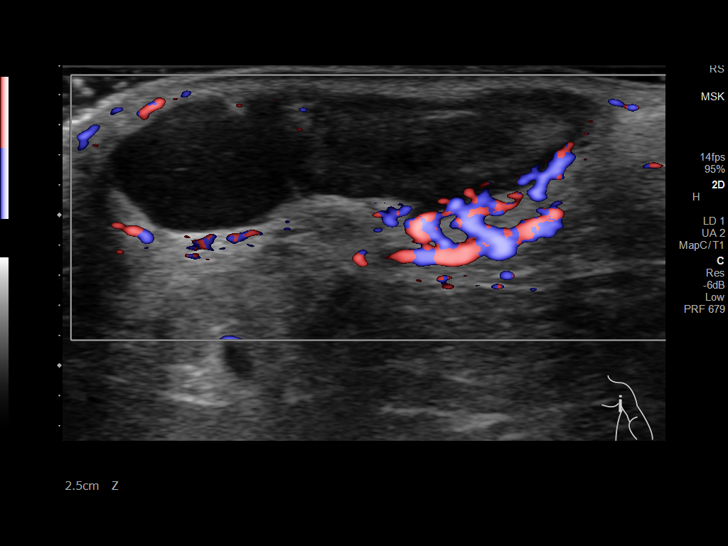
[im 11/18]
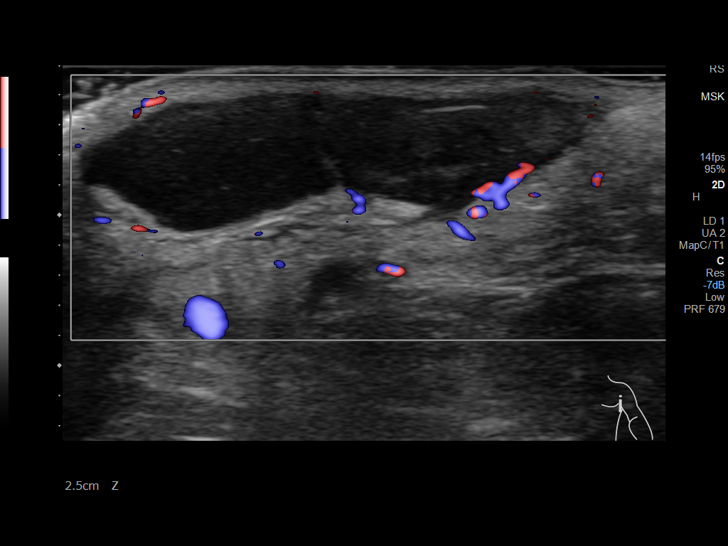
[im 12/18]
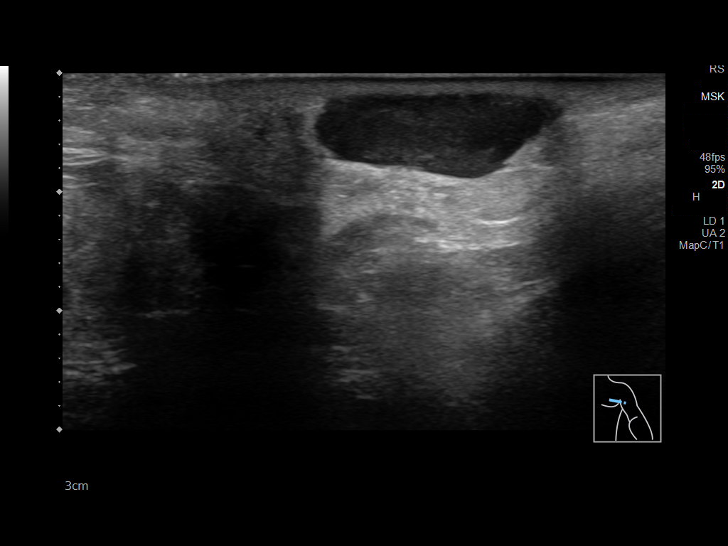
[im 14/18]
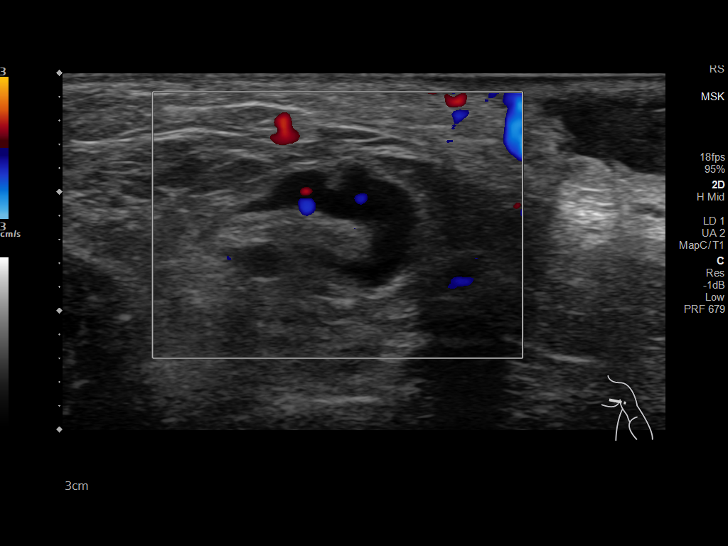
[im 15/18]
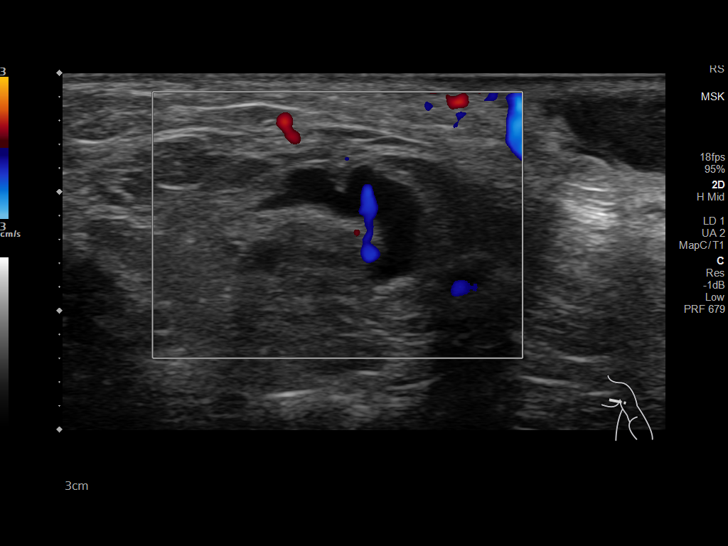
[im 16/18]
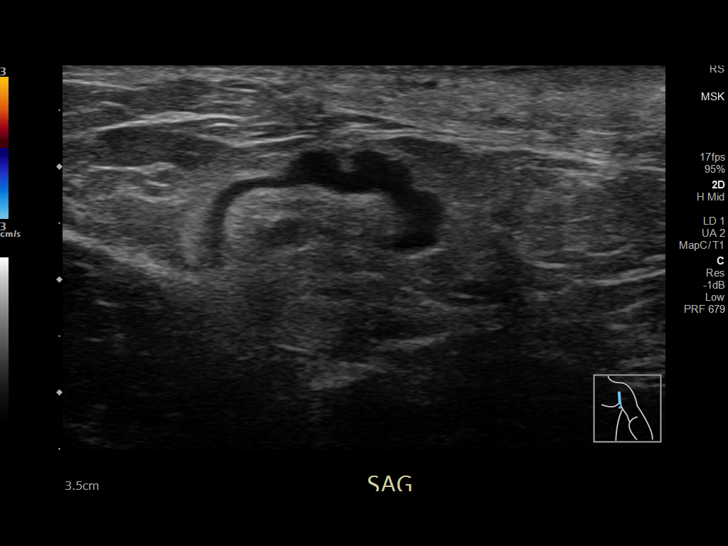
[im 18/18]
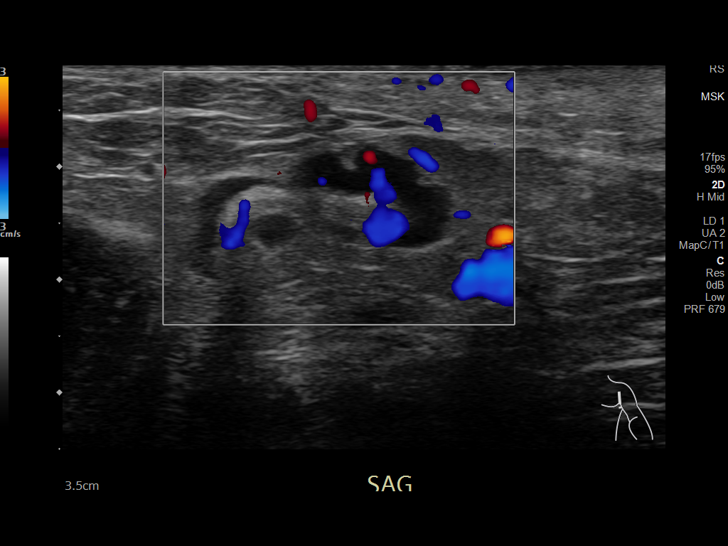

[14 of 16 positions shown; findings below may reference images not displayed]

FINDINGS: Targeted sonography of the left axillary region is performed. In the
region of swelling, there is a complex fluid collection measuring
3.3 x 0.8 x 1.9 cm. Adjacent enlarged lymph node measuring 1.3 cm
with slight eccentric cortical thickening.
IMPRESSION: 1. 3.3 cm complex fluid collection in the left axilla corresponding
to swelling. This may reflect infected or inflammatory fluid
collection.
2. Slightly enlarged left axillary lymph node, possibly reactive.

## 2020-08-14 MED ORDER — BIKTARVY 50-200-25 MG PO TABS: 1.0000 | ORAL_TABLET | Freq: Every day | ORAL | 0 refills | Status: DC

## 2020-08-14 MED ORDER — SULFAMETHOXAZOLE-TRIMETHOPRIM 800-160 MG PO TABS: 1.0000 | ORAL_TABLET | ORAL | 0 refills | Status: DC

## 2020-08-14 MED FILL — SULFAMETHOXAZOLE-TMP DS TAB: 800-160 | 12 days supply | Qty: 12 | Fill #0

## 2020-08-14 MED FILL — BIKTARVY 50-200-25 MG TABS: 50-200-25 | 30 days supply | Qty: 30 | Fill #0
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
## 2020-08-14 NOTE — Progress Notes (Signed)
PROGRESS NOTE    Travis Mcgee  YWV:[id number] DOB: 1981-10-24 DOA: 07/28/2020 PCP: Patient, No Pcp Per  As per Dr. Ella Jubilee: Travis Mcgee was admitted to the hospital with working diagnosis of [name] syndrome.  39 year old male with significant past medical history for HIV/AIDSand latent syphiliswho presented with bilateral extremity pain. Positive pain for the last 7 to 10 days, associated with paresthesias and weakness. Progressive symptoms with multiple falls. On his initial physical examination blood pressure 118/86, heart rate 110, respiratory rate 36, oxygen saturation 100%. His lungs were clear to auscultation, heart S1-S2, present rhythmic, soft abdomen, no lower extremity edema. His strength was preserved upper extremities, he had a slow gait.  Patient underwent further work-up with CT head of brain MRI which were negative. MRI of the cervical thoracic lumbar spine was unremarkable. Lumbar puncture showed negative culture, negative cryptococcus antigen and VDRL negative. He had elevated CSF IgG/albumin ratio consistent with intrathecal antibody production.  Neurology was consulted with recommendations to administer IVIG for 5 days.  Patient continue to have persistent lower extremity weakness and painful paresthesias.Ck 122 on 07/28/20. 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 He has no fevers, western blot negative, consistent with false positive.  Neuro work up included B1 levels, serum ACE and heavy metal screen.  Initially patient required SNF for discharge, during his hospitalization he has been clinically improving and now the recommendations is for home health services.   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 PT recs home health. Continue on thiamine, gabapentin, tramadol, methocarbamol & duloxetine. Continue on IV penicillin for 10 days, day#10/10.Today is last day for PCN.pt to be d/c in am once d/c planning is complete.Labs in am. Pt seen today he is alert,awake oriented and feeding himself breakfast. Pt has completed his iv PCN therapy.  HIV/ AIDS: continue on antiviral therapy & continue on prophylactic bactrim as per ID.  Appreciate ID management and care.   ACD: no need for a transfusion at this time. Will continue to monitor. Will obtain b12/folate and TFT's.   Hypothyroidism: Pt started on low dose levothyroxine.  Hyponatremia:  Resolved.   Hypokalemia: resolved.  Moderate calorie protein malnutrition: continue on nutritional supplements.   Left axillary swelling: Suspect abscess, usg pending  and gen surg consulted.    DVT prophylaxis: lovenox  Code Status: full  Family Communication: None at bedside.  Disposition Plan: likely d/c w/ home health    Consultants:   ID.  Neurology.  General surgery.  Procedures:  -Lumbar Puncture.  Antimicrobials:  Anti-infectives (From admission, onward)   Start     Dose/Rate Route Frequency Ordered Stop   08/15/20 0000  sulfamethoxazole-trimethoprim (BACTRIM DS) 800-160 MG tablet        1 tablet Oral 3 times weekly 08/14/20 1055 09/14/20 2359   08/15/20 0000  bictegravir-emtricitabine-tenofovir AF (BIKTARVY) 50-200-25 MG TABS tablet        1 tablet Oral Daily 08/14/20 1055 09/14/20 2359   08/03/20 1500  penicillin G potassium 6 Million Units in dextrose 5 % 500 mL continuous infusion  6 Million Units 41.7 mL/hr over 12 Hours Intravenous Every 12 hours 08/03/20 1407 08/13/20 1559   08/03/20 1200  penicillin G potassium 2  Million Units in dextrose 5 % 50 mL IVPB  Status:  Discontinued        2 Million Units 100 mL/hr over 30 Minutes Intravenous Every 4 hours 08/03/20 1136 08/03/20 1407   07/29/20 2200  anidulafungin (ERAXIS) 100 mg in sodium chloride 0.9 % 100 mL IVPB  Status:  Discontinued       "Followed by" Linked Group Details   100 mg 78 mL/hr over 100 Minutes Intravenous Every 24 hours 07/28/20 2037 07/28/20 2127   07/29/20 1715  sulfamethoxazole-trimethoprim (BACTRIM DS) 800-160 MG per tablet 1 tablet        1 tablet Oral Daily 07/29/20 1701     07/29/20 0900  vancomycin (VANCOREADY) IVPB 1250 mg/250 mL  Status:  Discontinued        1,250 mg 166.7 mL/hr over 90 Minutes Intravenous Every 12 hours 07/28/20 2049 07/29/20 1142   07/28/20 2300  acyclovir (ZOVIRAX) 660 mg in dextrose 5 % 100 mL IVPB  Status:  Discontinued        10 mg/kg  65.8 kg 113.2 mL/hr over 60 Minutes Intravenous Every 8 hours 07/28/20 2126 07/28/20 2129   07/28/20 2200  ampicillin (OMNIPEN) 2 g in sodium chloride 0.9 % 100 mL IVPB  Status:  Discontinued        2 g 300 mL/hr over 20 Minutes Intravenous Every 4 hours 07/28/20 2027 07/28/20 2127   07/28/20 2200  ceFEPIme (MAXIPIME) 2 g in sodium chloride 0.9 % 100 mL IVPB  Status:  Discontinued        2 g 200 mL/hr over 30 Minutes Intravenous Every 8 hours 07/28/20 2027 07/28/20 2127   07/28/20 2200  anidulafungin (ERAXIS) 200 mg in sodium chloride 0.9 % 200 mL IVPB  Status:  Discontinued       "Followed by" Linked Group Details   200 mg 78 mL/hr over 200 Minutes Intravenous  Once 07/28/20 2037 07/28/20 2127   07/28/20 2200  cefTRIAXone (ROCEPHIN) 2 g in sodium chloride 0.9 % 100 mL IVPB  Status:  Discontinued        2 g 200 mL/hr over 30 Minutes Intravenous Every 12 hours 07/28/20 2142 07/29/20 1142   07/28/20 2200  bictegravir-emtricitabine-tenofovir AF (BIKTARVY) 50-200-25 MG per tablet 1 tablet        1 tablet Oral Daily 07/28/20 2148     07/28/20 2130  cefTRIAXone (ROCEPHIN) 2  g in sodium chloride 0.9 % 100 mL IVPB  Status:  Discontinued        2 g 200 mL/hr over 30 Minutes Intravenous  Once 07/28/20 2130 07/28/20 2142   07/28/20 2100  vancomycin (VANCOREADY) IVPB 1500 mg/300 mL        1,500 mg 150 mL/hr over 120 Minutes Intravenous  Once 07/28/20 2027 07/29/20 0231   07/28/20 2030  anidulafungin (ERAXIS) 200 mg in sodium chloride 0.9 % 200 mL IVPB  Status:  Discontinued        200 mg 78 mL/hr over 200 Minutes Intravenous Every 24 hours 07/28/20 2027 07/28/20 2036   07/28/20 2030  metroNIDAZOLE (FLAGYL) IVPB 500 mg  Status:  Discontinued        500 mg 100 mL/hr over 60 Minutes Intravenous  Once 07/28/20 2027 07/28/20 2127      Subjective: Pt c/o b/l leg pain  08/12/2020: Pt is alert,awake and eating his  breakfast.  Denies any complaints but is interested in getting help at home. States he is feeling better.    08/13/2020 Pt seen today and he is doing well. Says he is ready to go home and told CM that he lives in a shed.d/w pt that I will d/w cm to provide him any and all resources that we can.  08/14/2020: Pt is alert today and spoke to him about plan for d/c when he showed me the swelling in his left axilla that stated last night. Labs are stable.  Objective: Vitals:   08/14/20 0126 08/14/20 0623 08/14/20 0822 08/14/20 1100  BP: 109/76 110/75 105/70 116/77  Pulse: 64 98 97 92  Resp: 18 18 20 18   Temp: 98.3 F (36.8 C) 98 F (36.7 C) 98 F (36.7 C) 97.9 F (36.6 C)  TempSrc: Oral Oral Oral Oral  SpO2: 98% 98% 98% 97%  Weight:      Height:        Intake/Output Summary (Last 24 hours) at 08/14/2020 1509 Last data filed at 08/14/2020 0900 Gross per 24 hour  Intake 240 ml  Output 775 ml  Net -535 ml   Filed Weights   07/28/20 1352  Weight: 65.8 kg   Examination: General exam: Appears calm and comfortable  Respiratory system: Clear to auscultation. No rales, rhonchi  Cardiovascular system: S1 & S2 +. No rubs, gallops or clicks.   Gastrointestinal system: Abdomen is nondistended, soft and nontender.  Normal bowel sounds heard. Central nervous system: Alert and oriented. Moves all 4 extremities  Psychiatry: Judgement and insight appear normal. Flat mood and affect   Data Reviewed: I have personally reviewed following labs and imaging studies  CBC: Recent Labs  Lab 08/11/20 0904 08/14/20 1124  WBC 7.2 8.4  NEUTROABS  --  3.1  HGB 11.9* 12.8*  HCT 34.9* 38.7*  MCV 105.8* 105.7*  PLT 322 358   Basic Metabolic Panel: Recent Labs  Lab 08/11/20 0904 08/14/20 1124  NA 133* 136  K 4.2 3.5  CL 102 101  CO2 21* 25  GLUCOSE 98 113*  BUN 10 9  CREATININE 0.68 0.71  CALCIUM 9.3 9.6   GFR: Estimated Creatinine Clearance: 114.2 mL/min (by C-G formula based on SCr of 0.71 mg/dL).  Radiology Studies: No results found.  Scheduled Meds:  bictegravir-emtricitabine-tenofovir AF  1 tablet Oral Daily   DULoxetine  20 mg Oral Daily   enoxaparin (LOVENOX) injection  40 mg Subcutaneous Q24H   feeding supplement  237 mL Oral Q24H   gabapentin  900 mg Oral TID   levothyroxine  25 mcg Oral Q0600   methocarbamol  500 mg Oral TID   multivitamin with minerals  1 tablet Oral Daily   sulfamethoxazole-trimethoprim  1 tablet Oral Daily   thiamine injection  100 mg Intravenous Daily   Or   thiamine  100 mg Oral Daily   vitamin B-12  1,000 mcg Oral Daily   Continuous Infusions:    LOS: 17 days   Time spent: 20 mins   08/16/20, MD [facility] Pager [phone] If 7PM-7AM, please contact night-coverage [email or web address] 08/14/2020, 3:09 PM

## 2020-08-14 NOTE — Progress Notes (Signed)
Nutrition Follow-up  DOCUMENTATION CODES:   Non-severe (moderate) malnutrition in context of chronic illness  INTERVENTION:    Ensure Enlive po once daily, each supplement provides 350 kcal and 20 grams of protein  MVI daily   NUTRITION DIAGNOSIS:   Moderate Malnutrition related to chronic illness (HIV) as evidenced by mild fat depletion, severe muscle depletion.  Ongoing  GOAL:   Patient will meet greater than or equal to 90% of their needs  Progressing  MONITOR:   PO intake, Supplement acceptance, Weight trends, Labs, I & O's  REASON FOR ASSESSMENT:   Consult Assessment of nutrition requirement/status  ASSESSMENT:   Patient with PMH significant for HIV/AIDS on HAART and latent syphilis. 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   Appetite remains stable. Meal completion charted as 100%. Drinking Ensure off/on. Plan d/c tomorrow am.   Admission weight: 65.8 kg   UOP: 500 ml x 24 hrs   Medications: MVI with minerals, thiamine, Vit B12 Labs: CBG 98-131  Diet Order:   Diet Order            Diet regular Room service appropriate? Yes; Fluid consistency: Thin  Diet effective now                 EDUCATION NEEDS:   Education needs have been addressed  Skin:  Skin Assessment: Reviewed RN Assessment  Last BM:  10/18  Height:   Ht Readings from Last 1 Encounters:  07/28/20 5\' 11"  (1.803 m)    Weight:   Wt Readings from Last 1 Encounters:  07/28/20 65.8 kg    BMI:  Body mass index is 20.22 kg/m.  Estimated Nutritional Needs:   Kcal:  2250-2450 kcal  Protein:  115-130 grams  Fluid:  >/= 2 L/day   09/27/20 RD, LDN Clinical Nutrition Pager listed in AMION

## 2020-08-14 NOTE — Progress Notes (Signed)
Physical Therapy Treatment Patient Details Name: Travis Mcgee MRN: [id number] DOB: 1981-06-07 Today's Date: 08/14/2020    History of Present Illness Travis Mcgee is a 39 y.o. male with medical history significant of HIV/AIDS had been on HAART.Pt presents to ED with c/o pain and weakness in BLE.  Thought to be secondary to Guillain barre and was started on IVIG.     PT Comments    Focus of session progressing ambulation with using SPC. Pt supine in bed on entry but eager to get up with therapy and work on ambulation. With vc for sequencing pt able to become proficient in [facility] use to improve stability with ambulation. D/c plans remain appropriate at this time. PT will continue to follow acutely.     Follow Up Recommendations  Home health PT     Equipment Recommendations  Rolling walker with 5" wheels       Precautions / Restrictions Precautions Precautions: Fall Precaution Comments: Has had multiple falls.  Restrictions Weight Bearing Restrictions: No    Mobility  Bed Mobility                  Transfers Overall transfer level: Modified independent   Transfers: Sit to/from Stand Sit to Stand: Modified independent (Device/Increase time)         General transfer comment: no physical assist needed  Ambulation/Gait Ambulation/Gait assistance: Modified independent (Device/Increase time) Gait Distance (Feet): 800 Feet Assistive device: Straight cane Gait Pattern/deviations: Step-through pattern;Decreased stride length;Trunk flexed Gait velocity: Decreased Gait velocity interpretation: <1.8 ft/sec, indicate of risk for recurrent falls General Gait Details: mod I for ambulation with SPC, vc for sequencing for maximal support of SPC         Balance Overall balance assessment: Needs assistance Sitting-balance support: No upper extremity supported Sitting balance-Leahy Scale: Good     Standing balance support: During functional activity;Single extremity  supported Standing balance-Leahy Scale: Fair                              Cognition Arousal/Alertness: Awake/alert Behavior During Therapy: WFL for tasks assessed/performed Overall Cognitive Status: Within Functional Limits for tasks assessed                                 General Comments: overall WFL for simple tasks         General Comments General comments (skin integrity, edema, etc.): worked on [employer] sequencing       Pertinent Vitals/Pain Pain Assessment: No/denies pain           PT Goals (current goals can now be found in the care plan section) Acute Rehab PT Goals Patient Stated Goal: to get back to work PT Goal Formulation: With patient Time For Goal Achievement: 08/16/20 Potential to Achieve Goals: Fair Progress towards PT goals: Progressing toward goals    Frequency    Min 2X/week      PT Plan Current plan remains appropriate       AM-PAC PT "6 Clicks" Mobility   Outcome Measure  Help needed turning from your back to your side while in a flat bed without using bedrails?: None Help needed moving from lying on your back to sitting on the side of a flat bed without using bedrails?: None Help needed moving to and from a bed to a chair (including a wheelchair)?: None Help needed standing up from a chair  using your arms (e.g., wheelchair or bedside chair)?: None Help needed to walk in hospital room?: None Help needed climbing 3-5 steps with a railing? : A Little 6 Click Score: 23    End of Session   Activity Tolerance: Patient tolerated treatment well Patient left: in bed;with call bell/phone within reach Nurse Communication: Mobility status PT Visit Diagnosis: Unsteadiness on feet (R26.81);Muscle weakness (generalized) (M62.81)     Time: 5170-0174 PT Time Calculation (min) (ACUTE ONLY): 18 min  Charges:  $Gait Training: 8-22 mins                     Travis Mcgee B. Beverely Risen PT, DPT Acute Rehabilitation Services Pager  [phone] Office [phone]    Travis Mcgee 08/14/2020, 2:41 PM

## 2020-08-15 DIAGNOSIS — R5381 Other malaise: Secondary | ICD-10-CM

## 2020-08-15 DIAGNOSIS — R296 Repeated falls: Secondary | ICD-10-CM

## 2020-08-15 DIAGNOSIS — L02419 Cutaneous abscess of limb, unspecified: Secondary | ICD-10-CM

## 2020-08-15 MED ORDER — SODIUM CHLORIDE 0.9 % IV SOLN
3.0000 g | Freq: Four times a day (QID) | INTRAVENOUS | Status: DC
  Administered 2020-08-15 – 2020-08-16 (×5): 3 g via INTRAVENOUS
  Filled 2020-08-15: qty 8
  Filled 2020-08-15: qty 3
  Filled 2020-08-15: qty 8
  Filled 2020-08-15 (×2): qty 3
  Filled 2020-08-15: qty 8
  Filled 2020-08-15: qty 3

## 2020-08-15 NOTE — Progress Notes (Signed)
Pharmacy Antibiotic Note  Travis Mcgee is a 39 y.o. male admitted on 07/28/2020 with L axilla abscess s/p aspiration 10/19.  Pharmacy has been consulted for ampicillin/sulbactam dosing.  Excellent renal function. On bactrim for PCP prophylaxis and s/p 10d course of IV PCN when developed abscess. Culture growing GPC. Plan 7-10 day course per surgery.   Plan: Amp/sulb 3g Q 6 hr Monitor cultures, clinical status, renal fx Narrow abx as able and f/u duration    Height: 5\' 11"  (180.3 cm) Weight: 65.8 kg (145 lb) IBW/kg (Calculated) : 75.3  Temp (24hrs), Avg:98.3 F (36.8 C), Min:97.9 F (36.6 C), Max:99.2 F (37.3 C)  Recent Labs  Lab 08/11/20 0904 08/14/20 1124  WBC 7.2 8.4  CREATININE 0.68 0.71    Estimated Creatinine Clearance: 114.2 mL/min (by C-G formula based on SCr of 0.71 mg/dL).    No Known Allergies  Antimicrobials this admission: 10/2 CTX >> 10/3 10/2 vanc > 10/3 10/3 Bactrim (ppx) 10/8 Pen G >> 10/17 10/20 Amp/sulb>>  Microbiology results: 10/19 abscess: few GPC 10/2 BCx: ngtd    Thank you for allowing pharmacy to be a part of this patient's care.  12/2, PharmD, BCPS, BCCP Clinical Pharmacist  Please check AMION for all [facility] Pharmacy phone numbers After 10:00 PM, call Main Pharmacy [phone]

## 2020-08-15 NOTE — Progress Notes (Signed)
[facility] Progress Note     Subjective: Patient reports lump in armpit has gone down some and is feeling a little better. Still some tenderness. No further bleeding or drainage after aspiration yesterday.   Objective: Vital signs in last 24 hours: Temp:  [97.9 F (36.6 C)-99.2 F (37.3 C)] 98.2 F (36.8 C) (10/20 0802) Pulse Rate:  [44-102] 102 (10/20 0802) Resp:  [16-20] 16 (10/20 0802) BP: (105-123)/(64-95) 109/77 (10/20 0802) SpO2:  [96 %-100 %] 97 % (10/20 0802) Last BM Date: 08/14/20  Intake/Output from previous day: 10/19 0701 - 10/20 0700 In: 480 [P.O.:480] Out: 775 [Urine:775] Intake/Output this shift: Total I/O In: -  Out: 800 [Urine:800]  PE: Constitutional: NAD; conversant; no deformities Eyes: Moist conjunctiva; no lid lag; anicteric; PERRL Neck: Trachea midline; no thyromegaly Lungs: Normal respiratory effort; no tactile fremitus CV: RRR; no palpable thrills; no pitting edema GI: Abd nontender; no palpable hepatosplenomegaly MSK: Normal gait; no clubbing/cyanosis Left axilla - abscess appears smaller in size and slightly less tender, no further drainage, less fluctuant Psychiatric: Appropriate affect; alert and oriented x3 Lymphatic: No palpable cervical or axillary lymphadenopathy   Lab Results:  Recent Labs    08/14/20 1124  WBC 8.4  HGB 12.8*  HCT 38.7*  PLT 358   BMET Recent Labs    08/14/20 1124  NA 136  K 3.5  CL 101  CO2 25  GLUCOSE 113*  BUN 9  CREATININE 0.71  CALCIUM 9.6   PT/INR No results for input(s): LABPROT, INR in the last 72 hours. CMP     Component Value Date/Time   NA 136 08/14/2020 1124   K 3.5 08/14/2020 1124   CL 101 08/14/2020 1124   CO2 25 08/14/2020 1124   GLUCOSE 113 (H) 08/14/2020 1124   BUN 9 08/14/2020 1124   CREATININE 0.71 08/14/2020 1124   CREATININE 0.77 11/30/2019 1645   CALCIUM 9.6 08/14/2020 1124   PROT 9.9 (H) 08/14/2020 1124   ALBUMIN 3.3 (L) 08/14/2020 1124   ALBUMIN 2.8  (L) 07/28/2020 2128   AST 37 08/14/2020 1124   ALT 50 (H) 08/14/2020 1124   ALKPHOS 151 (H) 08/14/2020 1124   BILITOT 0.4 08/14/2020 1124   GFRNONAA >60 08/14/2020 1124   GFRNONAA 114 11/30/2019 1645   GFRAA >60 07/30/2020 0623   GFRAA 132 11/30/2019 1645   Lipase  No results found for: LIPASE     Studies/Results: [place] CHEST SOFT TISSUE  Result Date: 08/14/2020 CLINICAL DATA:  Left armpit swelling EXAM: ULTRASOUND OF HEAD/NECK SOFT TISSUES TECHNIQUE: Ultrasound examination of the head and neck soft tissues was performed in the area of clinical concern. COMPARISON:  CT 11/29/2017 FINDINGS: Targeted sonography of the left axillary region is performed. In the region of swelling, there is a complex fluid collection measuring 3.3 x 0.8 x 1.9 cm. Adjacent enlarged lymph node measuring 1.3 cm with slight eccentric cortical thickening. IMPRESSION: 1. 3.3 cm complex fluid collection in the left axilla corresponding to swelling. This may reflect infected or inflammatory fluid collection. 2. Slightly enlarged left axillary lymph node, possibly reactive. Electronically Signed   By: Jasmine Pang M.D.   On: 08/14/2020 18:50    Anti-infectives: Anti-infectives (From admission, onward)   Start     Dose/Rate Route Frequency Ordered Stop   08/15/20 0000  sulfamethoxazole-trimethoprim (BACTRIM DS) 800-160 MG tablet        1 tablet Oral 3 times weekly 08/14/20 1055 09/14/20 2359   08/15/20 0000  bictegravir-emtricitabine-tenofovir AF (BIKTARVY) 50-200-25  MG TABS tablet        1 tablet Oral Daily 08/14/20 1055 09/14/20 2359   08/03/20 1500  penicillin G potassium 6 Million Units in dextrose 5 % 500 mL continuous infusion        6 Million Units 41.7 mL/hr over 12 Hours Intravenous Every 12 hours 08/03/20 1407 08/13/20 1559   08/03/20 1200  penicillin G potassium 2 Million Units in dextrose 5 % 50 mL IVPB  Status:  Discontinued        2 Million Units 100 mL/hr over 30 Minutes Intravenous Every 4 hours  08/03/20 1136 08/03/20 1407   07/29/20 2200  anidulafungin (ERAXIS) 100 mg in sodium chloride 0.9 % 100 mL IVPB  Status:  Discontinued       "Followed by" Linked Group Details   100 mg 78 mL/hr over 100 Minutes Intravenous Every 24 hours 07/28/20 2037 07/28/20 2127   07/29/20 1715  sulfamethoxazole-trimethoprim (BACTRIM DS) 800-160 MG per tablet 1 tablet        1 tablet Oral Daily 07/29/20 1701     07/29/20 0900  vancomycin (VANCOREADY) IVPB 1250 mg/250 mL  Status:  Discontinued        1,250 mg 166.7 mL/hr over 90 Minutes Intravenous Every 12 hours 07/28/20 2049 07/29/20 1142   07/28/20 2300  acyclovir (ZOVIRAX) 660 mg in dextrose 5 % 100 mL IVPB  Status:  Discontinued        10 mg/kg  65.8 kg 113.2 mL/hr over 60 Minutes Intravenous Every 8 hours 07/28/20 2126 07/28/20 2129   07/28/20 2200  ampicillin (OMNIPEN) 2 g in sodium chloride 0.9 % 100 mL IVPB  Status:  Discontinued        2 g 300 mL/hr over 20 Minutes Intravenous Every 4 hours 07/28/20 2027 07/28/20 2127   07/28/20 2200  ceFEPIme (MAXIPIME) 2 g in sodium chloride 0.9 % 100 mL IVPB  Status:  Discontinued        2 g 200 mL/hr over 30 Minutes Intravenous Every 8 hours 07/28/20 2027 07/28/20 2127   07/28/20 2200  anidulafungin (ERAXIS) 200 mg in sodium chloride 0.9 % 200 mL IVPB  Status:  Discontinued       "Followed by" Linked Group Details   200 mg 78 mL/hr over 200 Minutes Intravenous  Once 07/28/20 2037 07/28/20 2127   07/28/20 2200  cefTRIAXone (ROCEPHIN) 2 g in sodium chloride 0.9 % 100 mL IVPB  Status:  Discontinued        2 g 200 mL/hr over 30 Minutes Intravenous Every 12 hours 07/28/20 2142 07/29/20 1142   07/28/20 2200  bictegravir-emtricitabine-tenofovir AF (BIKTARVY) 50-200-25 MG per tablet 1 tablet        1 tablet Oral Daily 07/28/20 2148     07/28/20 2130  cefTRIAXone (ROCEPHIN) 2 g in sodium chloride 0.9 % 100 mL IVPB  Status:  Discontinued        2 g 200 mL/hr over 30 Minutes Intravenous  Once 07/28/20 2130  07/28/20 2142   07/28/20 2100  vancomycin (VANCOREADY) IVPB 1500 mg/300 mL        1,500 mg 150 mL/hr over 120 Minutes Intravenous  Once 07/28/20 2027 07/29/20 0231   07/28/20 2030  anidulafungin (ERAXIS) 200 mg in sodium chloride 0.9 % 200 mL IVPB  Status:  Discontinued        200 mg 78 mL/hr over 200 Minutes Intravenous Every 24 hours 07/28/20 2027 07/28/20 2036   07/28/20 2030  metroNIDAZOLE (FLAGYL) IVPB 500 mg  Status:  Discontinued        500 mg 100 mL/hr over 60 Minutes Intravenous  Once 07/28/20 2027 07/28/20 2127       Assessment/Plan Advanced HIV/AIDS PMH syphilis  guillain-barre syndrome  Left axillary abscess - wound aspirated and sent for aerobic and anaerobic cultures, follow - left axillary ultrasound with 3.3 x 0.8 x 1.9 cm complex fluid that may reflect infected vs inflammatory fluid - would like to avoid surgical I&D due to risk for poor wound healing. Hopefully most of the purulence was aspirated and patient can be treated with PO abx for 7-10 days.  - warm compresses - will defer abx selection to ID    FEN: reg diet  VTE: lovenox ID: [email or web address] HART; PO bactrim 10/3>>  LOS: 18 days    Juliet Rude , [facility] 08/15/2020, 8:17 AM Please see Amion for pager number during day hours 7:00am-4:30pm

## 2020-08-15 NOTE — Progress Notes (Signed)
PROGRESS NOTE  Erlin Gardella DXI:[id number] DOB: 12-15-1980   PCP: Patient, No Pcp Per  Patient is from: Home  DOA: 07/28/2020 LOS: 18  Chief complaints: Bilateral lower extremity pain, recurrent falls and paresthesia  Brief Narrative / Interim history: 39 year old male with PMH of HIV/AIDS and latent syphilis presenting with progressive BLE pain, paresthesia, weakness and recurrent falls, and admitted with working diagnosis of Travis Mcgee syndrome.  CTA head, MRI brain, MRI cervical, thoracic and lumbar spine without significant finding.  Neurology consulted.  Underwent lumbar puncture.  CSF fluid culture, cryptococcus antigen and VDRL negative.  He had elevated CSF IgG/albumin ratio consistent with intrathecal antibody production.  He received IVIG for 5 days.  However, continued to have BLE weakness and painful paresthesias.  There was also concern about possible neurosyphilis and started on IV penicillin that he completed on 08/12/2020.  He has elevated Borrelia antibodies, IgM/IgG but no recent tick exposure.  Western blot was negative as well.  Initially, therapy recommended SNF, but clinically improved.  Therapy recommending home with home health.  Patient has had a new finding of left axillary abscess on 10/19.  General surgery consulted and performed needle aspiration of 2 cc purulent pus.  Gram stain with few GPC's.  Started on IV Unasyn on 10/20.  ID reconsulted.  See individual problem list below for more hospital course.  Subjective: Seen and examined earlier this morning.  No major events overnight of this morning.  Still with painful lower extremity paresthesia. Also swelling painful swelling in left axilla.  No other complaints.  Objective: Vitals:   08/14/20 1946 08/15/20 0001 08/15/20 0802 08/15/20 1228  BP: 119/80 111/64 109/77 120/74  Pulse: (!) 44 88 (!) 102 (!) 106  Resp: 18 18 16 15   Temp: 99.2 F (37.3 C) 98.4 F (36.9 C) 98.2 F (36.8 C) 98.1 F (36.7 C)   TempSrc: Oral Oral Oral Oral  SpO2: 99% 96% 97% 95%  Weight:      Height:        Intake/Output Summary (Last 24 hours) at 08/15/2020 1434 Last data filed at 08/15/2020 0801 Gross per 24 hour  Intake 240 ml  Output 800 ml  Net -560 ml   Filed Weights   07/28/20 1352  Weight: 65.8 kg    Examination:  GENERAL: No apparent distress.  Nontoxic. HEENT: MMM.  Vision and hearing grossly intact.  NECK: Supple.  No apparent JVD.  RESP: On room air.  No IWOB.  Fair aeration bilaterally. CVS:  RRR. Heart sounds normal.  ABD/GI/GU: BS+. Abd soft, NTND.  MSK/EXT:  Moves extremities. No apparent deformity. No edema.  SKIN: About a quarter size palpable abscess in left axilla with overlying skin erythema. NEURO: Awake, alert and oriented appropriately.  No apparent focal neuro deficit. PSYCH: Calm. Normal affect.  Procedures:  07/29/2020-lumbar puncture 08/14/2020-needle aspiration of left axillary abscess  Microbiology summarized: 10/2-COVID-19 and influenza PCR negative. 10/2-CSF culture negative. 10/2-CSF fungal culture pending. 10/2-HSV 1 and 2 IgM and IgG negative. 10/2-blood cultures negative. 10/19-left axillary abscess culture pending.  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  S/p IVIG for 5 days and IV penicillin for 10 days.  Still with painful lower extremity paresthesia.  He has HIV.  -Continue duloxetine, gabapentin, tramadol and thiamine. -PT/OT  Recurrent falls: Likely due to the above. -PT/OT  HIV/ AIDS: Absolute CD4 count 90 on 07/30/20.  Quantitative HIV 1 RNA 3310 on 07/31/2020.  -  On Biktarvy and prophylactic dose Bactrim DS for PJP and toxo  Anemia of chronic disease/macrocytosis-H&H stable.  B12 and folic acid within normal -Monitor intermittently  Left axillary abscess: Aspirate fluid Gram stain with GPC's -Follow fluid culture -Start IV Unasyn -ID  reconsulted  Hypothyroidism: Slightly elevated TSH with low free T4. -Continue Synthroid-started here.  Hyponatremia:  Resolved.  Hypokalemia: resolved.  Debility/physical deconditioning: Improving. -Home health PT/OT  COVID-19 vaccination: -Okay to proceed with any Covid vaccine of choice per ID.  Moderate protein calorie malnutrition: Body mass index is 20.22 kg/m. Nutrition Problem: Moderate Malnutrition Etiology: chronic illness (HIV) Signs/Symptoms: mild fat depletion, severe muscle depletion Interventions: Ensure Enlive (each supplement provides 350kcal and 20 grams of protein), MVI   DVT prophylaxis:  enoxaparin (LOVENOX) injection 40 mg Start: 07/30/20 2200 SCDs Start: 07/28/20 2142  Code Status: Full code Family Communication: Patient and/or RN. Available if any question.  Status is: Inpatient  Remains inpatient appropriate because:Ongoing diagnostic testing needed not appropriate for outpatient work up, IV treatments appropriate due to intensity of illness or inability to take PO and Inpatient level of care appropriate due to severity of illness   Dispo:  Patient From:  Home  Planned Disposition: Home with Health Care Svc  Expected discharge date: 08/16/20  Medically stable for discharge:  Not yet        Consultants:  Infectious disease   Sch Meds:  Scheduled Meds: . bictegravir-emtricitabine-tenofovir AF  1 tablet Oral Daily  . DULoxetine  20 mg Oral Daily  . enoxaparin (LOVENOX) injection  40 mg Subcutaneous Q24H  . feeding supplement  237 mL Oral Q24H  . gabapentin  900 mg Oral TID  . levothyroxine  25 mcg Oral Q0600  . methocarbamol  500 mg Oral TID  . multivitamin with minerals  1 tablet Oral Daily  . sulfamethoxazole-trimethoprim  1 tablet Oral Daily  . thiamine injection  100 mg Intravenous Daily   Or  . thiamine  100 mg Oral Daily  . vitamin B-12  1,000 mcg Oral Daily   Continuous Infusions: . ampicillin-sulbactam (UNASYN) IV 3  g (08/15/20 1001)   PRN Meds:.acetaminophen **OR** acetaminophen, ondansetron **OR** ondansetron (ZOFRAN) IV, traMADol  Antimicrobials: Anti-infectives (From admission, onward)   Start     Dose/Rate Route Frequency Ordered Stop   08/15/20 0930  Ampicillin-Sulbactam (UNASYN) 3 g in sodium chloride 0.9 % 100 mL IVPB        3 g 200 mL/hr over 30 Minutes Intravenous Every 6 hours 08/15/20 0845     08/15/20 0000  sulfamethoxazole-trimethoprim (BACTRIM DS) 800-160 MG tablet        1 tablet Oral 3 times weekly 08/14/20 1055 09/14/20 2359   08/15/20 0000  bictegravir-emtricitabine-tenofovir AF (BIKTARVY) 50-200-25 MG TABS tablet        1 tablet Oral Daily 08/14/20 1055 09/14/20 2359   08/03/20 1500  penicillin G potassium 6 Million Units in dextrose 5 % 500 mL continuous infusion        6 Million Units 41.7 mL/hr over 12 Hours Intravenous Every 12 hours 08/03/20 1407 08/13/20 1559   08/03/20 1200  penicillin G potassium 2 Million Units in dextrose 5 % 50 mL IVPB  Status:  Discontinued        2 Million Units 100 mL/hr over 30 Minutes Intravenous Every 4 hours 08/03/20 1136 08/03/20 1407   07/29/20 2200  anidulafungin (ERAXIS) 100 mg in sodium chloride 0.9 % 100 mL IVPB  Status:  Discontinued       "  Followed by" Linked Group Details   100 mg 78 mL/hr over 100 Minutes Intravenous Every 24 hours 07/28/20 2037 07/28/20 2127   07/29/20 1715  sulfamethoxazole-trimethoprim (BACTRIM DS) 800-160 MG per tablet 1 tablet        1 tablet Oral Daily 07/29/20 1701     07/29/20 0900  vancomycin (VANCOREADY) IVPB 1250 mg/250 mL  Status:  Discontinued        1,250 mg 166.7 mL/hr over 90 Minutes Intravenous Every 12 hours 07/28/20 2049 07/29/20 1142   07/28/20 2300  acyclovir (ZOVIRAX) 660 mg in dextrose 5 % 100 mL IVPB  Status:  Discontinued        10 mg/kg  65.8 kg 113.2 mL/hr over 60 Minutes Intravenous Every 8 hours 07/28/20 2126 07/28/20 2129   07/28/20 2200  ampicillin (OMNIPEN) 2 g in sodium chloride 0.9  % 100 mL IVPB  Status:  Discontinued        2 g 300 mL/hr over 20 Minutes Intravenous Every 4 hours 07/28/20 2027 07/28/20 2127   07/28/20 2200  ceFEPIme (MAXIPIME) 2 g in sodium chloride 0.9 % 100 mL IVPB  Status:  Discontinued        2 g 200 mL/hr over 30 Minutes Intravenous Every 8 hours 07/28/20 2027 07/28/20 2127   07/28/20 2200  anidulafungin (ERAXIS) 200 mg in sodium chloride 0.9 % 200 mL IVPB  Status:  Discontinued       "Followed by" Linked Group Details   200 mg 78 mL/hr over 200 Minutes Intravenous  Once 07/28/20 2037 07/28/20 2127   07/28/20 2200  cefTRIAXone (ROCEPHIN) 2 g in sodium chloride 0.9 % 100 mL IVPB  Status:  Discontinued        2 g 200 mL/hr over 30 Minutes Intravenous Every 12 hours 07/28/20 2142 07/29/20 1142   07/28/20 2200  bictegravir-emtricitabine-tenofovir AF (BIKTARVY) 50-200-25 MG per tablet 1 tablet        1 tablet Oral Daily 07/28/20 2148     07/28/20 2130  cefTRIAXone (ROCEPHIN) 2 g in sodium chloride 0.9 % 100 mL IVPB  Status:  Discontinued        2 g 200 mL/hr over 30 Minutes Intravenous  Once 07/28/20 2130 07/28/20 2142   07/28/20 2100  vancomycin (VANCOREADY) IVPB 1500 mg/300 mL        1,500 mg 150 mL/hr over 120 Minutes Intravenous  Once 07/28/20 2027 07/29/20 0231   07/28/20 2030  anidulafungin (ERAXIS) 200 mg in sodium chloride 0.9 % 200 mL IVPB  Status:  Discontinued        200 mg 78 mL/hr over 200 Minutes Intravenous Every 24 hours 07/28/20 2027 07/28/20 2036   07/28/20 2030  metroNIDAZOLE (FLAGYL) IVPB 500 mg  Status:  Discontinued        500 mg 100 mL/hr over 60 Minutes Intravenous  Once 07/28/20 2027 07/28/20 2127       I have personally reviewed the following labs and images: CBC: Recent Labs  Lab 08/11/20 0904 08/14/20 1124  WBC 7.2 8.4  NEUTROABS  --  3.1  HGB 11.9* 12.8*  HCT 34.9* 38.7*  MCV 105.8* 105.7*  PLT 322 358   BMP &GFR Recent Labs  Lab 08/11/20 0904 08/14/20 1124  NA 133* 136  K 4.2 3.5  CL 102 101  CO2  21* 25  GLUCOSE 98 113*  BUN 10 9  CREATININE 0.68 0.71  CALCIUM 9.3 9.6   Estimated Creatinine Clearance: 114.2 mL/min (by C-G formula based on SCr  of 0.71 mg/dL). Liver & Pancreas: Recent Labs  Lab 08/14/20 1124  AST 37  ALT 50*  ALKPHOS 151*  BILITOT 0.4  PROT 9.9*  ALBUMIN 3.3*   No results for input(s): LIPASE, AMYLASE in the last 168 hours. No results for input(s): AMMONIA in the last 168 hours. Diabetic: No results for input(s): HGBA1C in the last 72 hours. No results for input(s): GLUCAP in the last 168 hours. Cardiac Enzymes: No results for input(s): CKTOTAL, CKMB, CKMBINDEX, TROPONINI in the last 168 hours. No results for input(s): PROBNP in the last 8760 hours. Coagulation Profile: No results for input(s): INR, PROTIME in the last 168 hours. Thyroid Function Tests: No results for input(s): TSH, T4TOTAL, FREET4, T3FREE, THYROIDAB in the last 72 hours. Lipid Profile: No results for input(s): CHOL, HDL, LDLCALC, TRIG, CHOLHDL, LDLDIRECT in the last 72 hours. Anemia Panel: No results for input(s): VITAMINB12, FOLATE, FERRITIN, TIBC, IRON, RETICCTPCT in the last 72 hours. Urine analysis:    Component Value Date/Time   COLORURINE AMBER (A) 07/28/2020 1303   APPEARANCEUR CLEAR 07/28/2020 1303   LABSPEC 1.012 07/28/2020 1303   PHURINE 6.0 07/28/2020 1303   GLUCOSEU NEGATIVE 07/28/2020 1303   HGBUR NEGATIVE 07/28/2020 1303   BILIRUBINUR SMALL (A) 07/28/2020 1303   KETONESUR NEGATIVE 07/28/2020 1303   PROTEINUR NEGATIVE 07/28/2020 1303   NITRITE NEGATIVE 07/28/2020 1303   LEUKOCYTESUR TRACE (A) 07/28/2020 1303   Sepsis Labs: Invalid input(s): PROCALCITONIN, LACTICIDVEN  Microbiology: Recent Results (from the past 240 hour(s))  Aerobic/Anaerobic Culture (surgical/deep wound)     Status: None (Preliminary result)   Collection Time: 08/14/20  4:08 PM   Specimen: Wound; Abscess  Result Value Ref Range Status   Specimen Description WOUND LEFT AXILLA  Final    Special Requests Immunocompromised  Final   Gram Stain   Final    ABUNDANT WBC PRESENT, PREDOMINANTLY PMN FEW GRAM POSITIVE COCCI    Culture   Final    CULTURE REINCUBATED FOR BETTER GROWTH Performed at [facility], [address] [place], [place] [address]    Report Status PENDING  Incomplete    Radiology Studies: [place] CHEST SOFT TISSUE  Result Date: 08/14/2020 CLINICAL DATA:  Left armpit swelling EXAM: ULTRASOUND OF HEAD/NECK SOFT TISSUES TECHNIQUE: Ultrasound examination of the head and neck soft tissues was performed in the area of clinical concern. COMPARISON:  CT 11/29/2017 FINDINGS: Targeted sonography of the left axillary region is performed. In the region of swelling, there is a complex fluid collection measuring 3.3 x 0.8 x 1.9 cm. Adjacent enlarged lymph node measuring 1.3 cm with slight eccentric cortical thickening. IMPRESSION: 1. 3.3 cm complex fluid collection in the left axilla corresponding to swelling. This may reflect infected or inflammatory fluid collection. 2. Slightly enlarged left axillary lymph node, possibly reactive. Electronically Signed   By: Jasmine Pang M.D.   On: 08/14/2020 18:50      Tacoya Altizer [facility] [facility]  If 7PM-7AM, please contact night-coverage [email or web address] 08/15/2020, 2:34 PM

## 2020-08-15 NOTE — Progress Notes (Signed)
[facility]  Date of Admission:  07/28/2020     Total days of antibiotics 19         ASSESSMENT:  Travis Mcgee has completed 5 days of IVIG with concern for Guillain Barre Syndrome and remains on Biktarvy supplemented with Bactrim for OI prophylaxis for his AIDS virus now complicated by development of left axillary abscess s/p aspiration and gram stain with gram positive cocci. Clinically has remained stable. Currently receiving Unasyn. Will continue current dose of Unasyn pending culture results. May need further I&D depending on organism. Does not appear to be opportunistic infection at this time. Will continue Biktarvy and Bactrim as well. 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 Would benefit from Physical Therapy which is currently limited by the [place].    PLAN:  1. Continue Unasyn.  2. Monitor cultures for organism identification and narrow antibiotics as able. 3. Continue current dose of Biktarvy supplemented with Bactrim for OI prophylaxis.  4. Will need continued social work assistance for disposition planning.   Principal Problem:   Guillain Barr syndrome (HCC) Active Problems:   AIDS (acquired immune deficiency syndrome) (HCC)   History of syphilis   Lactic acidosis   Malnutrition of moderate degree   . bictegravir-emtricitabine-tenofovir AF  1 tablet Oral Daily  . DULoxetine  20 mg Oral Daily  . enoxaparin (LOVENOX) injection  40 mg Subcutaneous Q24H  . feeding supplement  237 mL Oral Q24H  . gabapentin  900 mg Oral TID  . levothyroxine  25 mcg Oral Q0600  . methocarbamol  500 mg Oral TID  . multivitamin with minerals  1 tablet Oral Daily  . sulfamethoxazole-trimethoprim  1 tablet Oral Daily  . thiamine injection  100 mg Intravenous Daily   Or  . thiamine  100 mg Oral Daily  . vitamin B-12  1,000 mcg Oral Daily    SUBJECTIVE:  Travis Mcgee has received 5 days of IVIG for Guillian Barre Syndrome and was given 10  days of Penicillin for concern of neurosyphilis. He has continued to take Biktarvy supplemented with Bactrim for OI prophylaxis. Most recent blood work for AIDS completed on 10/5 with viral load of 3,310 and CD4 count of 90 with a CD4 % of 6. In the interim he has developed a left axillary abscess that was aspirated by General Surgery. Has remained afebrile. Aspiration fluid gram stain with gram positive cocci.   No Known Allergies   Review of Systems: Review of Systems  Constitutional: Negative for chills, fever and weight loss.  Respiratory: Negative for cough, shortness of breath and wheezing.   Cardiovascular: Negative for chest pain and leg swelling.  Gastrointestinal: Negative for abdominal pain, constipation, diarrhea, nausea and vomiting.  Skin: Negative for rash.       Axillary abscess  Neurological: Positive for weakness.      OBJECTIVE: Vitals:   08/14/20 1946 08/15/20 0001 08/15/20 0802 08/15/20 1228  BP: 119/80 111/64 109/77 120/74  Pulse: (!) 44 88 (!) 102 (!) 106  Resp: 18 18 16 15   Temp: 99.2 F (37.3 C) 98.4 F (36.9 C) 98.2 F (36.8 C) 98.1 F (36.7 C)  TempSrc: Oral Oral Oral Oral  SpO2: 99% 96% 97% 95%  Weight:      Height:       Body mass index is 20.22 kg/m.  Physical Exam Constitutional:      General: He is not in acute distress.    Appearance: He is well-developed.  Comments: Lying in bed with head of bed elevated; pleasant.   Cardiovascular:     Rate and Rhythm: Normal rate and regular rhythm.     Heart sounds: Normal heart sounds.  Pulmonary:     Effort: Pulmonary effort is normal.     Breath sounds: Normal breath sounds.  Skin:    General: Skin is warm and dry.     Comments: Abscess in left axilla. Firm, tender to touch.   Neurological:     Mental Status: He is alert and oriented to person, place, and time.  Psychiatric:        Behavior: Behavior normal.        Thought Content: Thought content normal.        Judgment: Judgment  normal.     Lab Results Lab Results  Component Value Date   WBC 8.4 08/14/2020   HGB 12.8 (L) 08/14/2020   HCT 38.7 (L) 08/14/2020   MCV 105.7 (H) 08/14/2020   PLT 358 08/14/2020    Lab Results  Component Value Date   CREATININE 0.71 08/14/2020   BUN 9 08/14/2020   NA 136 08/14/2020   K 3.5 08/14/2020   CL 101 08/14/2020   CO2 25 08/14/2020    Lab Results  Component Value Date   ALT 50 (H) 08/14/2020   AST 37 08/14/2020   ALKPHOS 151 (H) 08/14/2020   BILITOT 0.4 08/14/2020     Microbiology: Recent Results (from the past 240 hour(s))  Aerobic/Anaerobic Culture (surgical/deep wound)     Status: None (Preliminary result)   Collection Time: 08/14/20  4:08 PM   Specimen: Wound; Abscess  Result Value Ref Range Status   Specimen Description WOUND LEFT AXILLA  Final   Special Requests Immunocompromised  Final   Gram Stain   Final    ABUNDANT WBC PRESENT, PREDOMINANTLY PMN FEW GRAM POSITIVE COCCI    Culture   Final    CULTURE REINCUBATED FOR BETTER GROWTH Performed at [facility], [address] [place], [place] [address]    Report Status PENDING  Incomplete     Marcos Eke, NP [facility]  08/15/2020  4:32 PM

## 2020-08-16 ENCOUNTER — Inpatient Hospital Stay: Payer: No Typology Code available for payment source

## 2020-08-16 DIAGNOSIS — L02419 Cutaneous abscess of limb, unspecified: Secondary | ICD-10-CM

## 2020-08-16 DIAGNOSIS — Z23 Encounter for immunization: Secondary | ICD-10-CM

## 2020-08-16 LAB — HEAVY METALS PROFILE, URINE
Arsenic (Total),U: 20 ug/L — ABNORMAL HIGH (ref 0–9)
Creatinine(Crt),U: 0.57 g/L (ref 0.30–3.00)
Inorg. As/Crt Ratio: 35 ug/g creat
Lead, Rand Ur: 2 ug/L (ref 0–49)
Lead/Creat.  Ratio: 4 ug/g creat (ref 0–49)
Mercury, Ur: NOT DETECTED ug/L (ref 0–19)

## 2020-08-16 LAB — CD4/CD8 (T-HELPER/T-SUPPRESSOR CELL)
CD4 absolute: 195 /uL — ABNORMAL LOW (ref 400–1790)
CD4%: 7 % — ABNORMAL LOW (ref 33–65)
CD8 T Cell Abs: 2123 /uL — ABNORMAL HIGH (ref 190–1000)
CD8tox: 78 % — ABNORMAL HIGH (ref 12–40)
Ratio: 0.09 — ABNORMAL LOW (ref 1.0–3.0)
Total lymphocyte count: 2713 /uL (ref 1000–4000)

## 2020-08-16 LAB — HIV-1 RNA QUANT-NO REFLEX-BLD
HIV 1 RNA Quant: 450 copies/mL
LOG10 HIV-1 RNA: 2.653 log10copy/mL

## 2020-08-16 MED ORDER — LORAZEPAM 2 MG/ML IJ SOLN: 0.5000 mg | Freq: Once | INTRAMUSCULAR | Status: AC

## 2020-08-16 MED ORDER — LORAZEPAM 2 MG/ML IJ SOLN
INTRAMUSCULAR | Status: AC
  Filled 2020-08-16: qty 1

## 2020-08-16 MED ORDER — VANCOMYCIN HCL 1500 MG/300ML IV SOLN
1500.0000 mg | Freq: Once | INTRAVENOUS | Status: AC
  Administered 2020-08-16: 1500 mg via INTRAVENOUS
  Filled 2020-08-16: qty 300

## 2020-08-16 MED ORDER — OXYCODONE HCL 5 MG PO TABS
5.0000 mg | ORAL_TABLET | ORAL | Status: DC | PRN
  Administered 2020-08-16 – 2020-08-18 (×11): 5 mg via ORAL
  Filled 2020-08-16 (×11): qty 1

## 2020-08-16 MED ORDER — VANCOMYCIN HCL 1500 MG/300ML IV SOLN: 1500.0000 mg | Freq: Two times a day (BID) | INTRAVENOUS | Status: DC

## 2020-08-16 NOTE — Progress Notes (Signed)
PROGRESS NOTE  Travis Mcgee XHB:[id number] DOB: 02-Jul-1981   PCP: Patient, No Pcp Per  Patient is from: Resides in a shed at friends backyard  DOA: 07/28/2020 LOS: 19  Chief complaints: Bilateral lower extremity pain, recurrent falls and paresthesia  Brief Narrative / Interim history: 39 year old male with PMH of HIV/AIDS and latent syphilis presenting with progressive BLE pain, paresthesia, weakness and recurrent falls, and admitted with working diagnosis of [name] syndrome.  CTA head, MRI brain, MRI cervical, thoracic and lumbar spine without significant finding.  Neurology consulted.  Underwent lumbar puncture.  CSF fluid culture, cryptococcus antigen and VDRL negative.  He had elevated CSF IgG/albumin ratio consistent with intrathecal antibody production.  He received IVIG for 5 days.  However, continued to have BLE weakness and painful paresthesias.  There was also concern about possible neurosyphilis and started on IV penicillin that he completed on 08/12/2020.  He has elevated Borrelia antibodies, IgM/IgG but no recent tick exposure.  Western blot was negative as well.  Initially, therapy recommended SNF, but clinically improved.  Therapy recommending home with home health.  Patient has had a new finding of left axillary abscess on 10/19.  General surgery consulted and performed needle aspiration of 2 cc purulent pus. Started on IV Unasyn on 10/20.  ID reconsulted, and recommended I&D to evacuate abscess, continuing IV Unasyn and sent labs for HIV RNA, CD4 and AFB blood cultures.   Wound culture grew   Culture showed Staphylococcus Lugdunensis. Patient underwent I&D on 10/22.  Antibiotics changed to IV vancomycin  See individual problem list below for more hospital course.  Subjective: Seen and examined earlier this morning.  No major events overnight of this morning.  Continues to endorse pain and paresthesia in lower extremities.  Also increased pain and swelling in left  axilla.  He was noted ambulate independently in the hallway talking to staff.  Objective: Vitals:   08/15/20 2310 08/16/20 0308 08/16/20 0415 08/16/20 0734  BP: 111/72  107/75 118/84  Pulse: 81  (!) 101   Resp: 18  17 16   Temp: 98 F (36.7 C)  98.3 F (36.8 C) 98 F (36.7 C)  TempSrc: Oral  Oral Oral  SpO2: 98%  97% 99%  Weight:  71.7 kg    Height:        Intake/Output Summary (Last 24 hours) at 08/16/2020 1542 Last data filed at 08/16/2020 0735 Gross per 24 hour  Intake 298.24 ml  Output 900 ml  Net -601.76 ml   Filed Weights   07/28/20 1352 08/16/20 0308  Weight: 65.8 kg 71.7 kg    Examination:  GENERAL: No apparent distress.  Nontoxic. HEENT: MMM.  Vision and hearing grossly intact.  NECK: Supple.  No apparent JVD.  RESP: On room air.  No IWOB.  Fair aeration bilaterally. CVS:  RRR. Heart sounds normal.  ABD/GI/GU: BS+. Abd soft, NTND.  MSK/EXT:  Moves extremities. No apparent deformity. No edema.  SKIN: Swelling and fluctuance in left axilla.  Tenderness to palpation. NEURO: Awake, alert and oriented appropriately.  No apparent focal neuro deficit.  Independently ambulates in hallway. PSYCH: Calm. Normal affect.  Procedures:  07/29/2020-lumbar puncture 08/14/2020-needle aspiration of left axillary abscess  Microbiology summarized: 10/2-COVID-19 and influenza PCR negative. 10/2-CSF culture negative. 10/2-CSF fungal culture pending. 10/2-HSV 1 and 2 IgM and IgG negative. 10/2-blood cultures negative. 10/19-left axillary abscess culture Staphylococcus Lugdunensis.  10/20-AFB blood culture pending.  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  S/p IVIG for 5 days and IV penicillin for 10 days.  Still with painful lower extremity paresthesia.  He has HIV.  -Continue duloxetine, gabapentin, tramadol and thiamine. -PT/OT-independently ambulating  Recurrent falls: Likely due to the above.  Now he  ambulates independently.  Left axillary abscess: Needle aspiration on 10/19.  Abscess culture grew staphylococcus Lugdunensis.  I&D on 10/21. -IV Unasyn 10/20-10/21>> vancomycin 10/21 -Appreciate ID input  HIV/ AIDS: Absolute CD4 count 90 on 07/30/20.  Quantitative HIV 1 RNA 3310 on 07/31/2020.  -On Biktarvy and prophylactic dose Bactrim DS for PJP and toxo -Follow quantitative viral RNA and CD4 counts  Anemia of chronic disease/macrocytosis-H&H stable.  B12 and folic acid within normal -Monitor intermittently  Hypothyroidism: Slightly elevated TSH with low free T4. -Continue Synthroid-started here.  Hyponatremia:  Resolved.  Hypokalemia: resolved.  Debility/physical deconditioning: Seems to have resolved.  Ambulating independently. -Home health PT/OT  COVID-19 vaccination: -Received the first dose of Pfizer vaccine on 10/21 -Second dose in 3 to 4 weeks  Moderate protein calorie malnutrition: Body mass index is 22.05 kg/m. Nutrition Problem: Moderate Malnutrition Etiology: chronic illness (HIV) Signs/Symptoms: mild fat depletion, severe muscle depletion Interventions: Ensure Enlive (each supplement provides 350kcal and 20 grams of protein), MVI   DVT prophylaxis:  enoxaparin (LOVENOX) injection 40 mg Start: 07/30/20 2200 SCDs Start: 07/28/20 2142  Code Status: Full code Family Communication: Patient and/or RN. Available if any question.  Status is: Inpatient  Remains inpatient appropriate because:Ongoing diagnostic testing needed not appropriate for outpatient work up, IV treatments appropriate due to intensity of illness or inability to take PO and Inpatient level of care appropriate due to severity of illness   Dispo:  Patient From:  Home  Planned Disposition: To be determined  Expected discharge date: 08/20/20  Medically stable for discharge:  Not yet        Consultants:  Infectious disease  General surgery   Sch Meds:  Scheduled Meds: .  bictegravir-emtricitabine-tenofovir AF  1 tablet Oral Daily  . DULoxetine  20 mg Oral Daily  . enoxaparin (LOVENOX) injection  40 mg Subcutaneous Q24H  . feeding supplement  237 mL Oral Q24H  . gabapentin  900 mg Oral TID  . levothyroxine  25 mcg Oral Q0600  . methocarbamol  500 mg Oral TID  . multivitamin with minerals  1 tablet Oral Daily  . sulfamethoxazole-trimethoprim  1 tablet Oral Daily  . thiamine injection  100 mg Intravenous Daily   Or  . thiamine  100 mg Oral Daily  . vitamin B-12  1,000 mcg Oral Daily   Continuous Infusions: . [START ON 08/17/2020] vancomycin     PRN Meds:.acetaminophen **OR** acetaminophen, ondansetron **OR** ondansetron (ZOFRAN) IV, oxyCODONE, traMADol  Antimicrobials: Anti-infectives (From admission, onward)   Start     Dose/Rate Route Frequency Ordered Stop   08/17/20 2330  vancomycin (VANCOREADY) IVPB 1500 mg/300 mL        1,500 mg 150 mL/hr over 120 Minutes Intravenous Every 12 hours 08/16/20 1026     08/16/20 1130  vancomycin (VANCOREADY) IVPB 1500 mg/300 mL        1,500 mg 150 mL/hr over 120 Minutes Intravenous  Once 08/16/20 1026 08/16/20 1355   08/15/20 0930  Ampicillin-Sulbactam (UNASYN) 3 g in sodium chloride 0.9 % 100 mL IVPB  Status:  Discontinued        3 g 200 mL/hr over 30 Minutes Intravenous Every 6 hours 08/15/20 0845 08/16/20 1021   08/15/20 0000  sulfamethoxazole-trimethoprim (BACTRIM DS) 800-160 MG  tablet        1 tablet Oral 3 times weekly 08/14/20 1055 09/14/20 2359   08/15/20 0000  bictegravir-emtricitabine-tenofovir AF (BIKTARVY) 50-200-25 MG TABS tablet        1 tablet Oral Daily 08/14/20 1055 09/14/20 2359   08/03/20 1500  penicillin G potassium 6 Million Units in dextrose 5 % 500 mL continuous infusion        6 Million Units 41.7 mL/hr over 12 Hours Intravenous Every 12 hours 08/03/20 1407 08/13/20 1559   08/03/20 1200  penicillin G potassium 2 Million Units in dextrose 5 % 50 mL IVPB  Status:  Discontinued        2  Million Units 100 mL/hr over 30 Minutes Intravenous Every 4 hours 08/03/20 1136 08/03/20 1407   07/29/20 2200  anidulafungin (ERAXIS) 100 mg in sodium chloride 0.9 % 100 mL IVPB  Status:  Discontinued       "Followed by" Linked Group Details   100 mg 78 mL/hr over 100 Minutes Intravenous Every 24 hours 07/28/20 2037 07/28/20 2127   07/29/20 1715  sulfamethoxazole-trimethoprim (BACTRIM DS) 800-160 MG per tablet 1 tablet        1 tablet Oral Daily 07/29/20 1701     07/29/20 0900  vancomycin (VANCOREADY) IVPB 1250 mg/250 mL  Status:  Discontinued        1,250 mg 166.7 mL/hr over 90 Minutes Intravenous Every 12 hours 07/28/20 2049 07/29/20 1142   07/28/20 2300  acyclovir (ZOVIRAX) 660 mg in dextrose 5 % 100 mL IVPB  Status:  Discontinued        10 mg/kg  65.8 kg 113.2 mL/hr over 60 Minutes Intravenous Every 8 hours 07/28/20 2126 07/28/20 2129   07/28/20 2200  ampicillin (OMNIPEN) 2 g in sodium chloride 0.9 % 100 mL IVPB  Status:  Discontinued        2 g 300 mL/hr over 20 Minutes Intravenous Every 4 hours 07/28/20 2027 07/28/20 2127   07/28/20 2200  ceFEPIme (MAXIPIME) 2 g in sodium chloride 0.9 % 100 mL IVPB  Status:  Discontinued        2 g 200 mL/hr over 30 Minutes Intravenous Every 8 hours 07/28/20 2027 07/28/20 2127   07/28/20 2200  anidulafungin (ERAXIS) 200 mg in sodium chloride 0.9 % 200 mL IVPB  Status:  Discontinued       "Followed by" Linked Group Details   200 mg 78 mL/hr over 200 Minutes Intravenous  Once 07/28/20 2037 07/28/20 2127   07/28/20 2200  cefTRIAXone (ROCEPHIN) 2 g in sodium chloride 0.9 % 100 mL IVPB  Status:  Discontinued        2 g 200 mL/hr over 30 Minutes Intravenous Every 12 hours 07/28/20 2142 07/29/20 1142   07/28/20 2200  bictegravir-emtricitabine-tenofovir AF (BIKTARVY) 50-200-25 MG per tablet 1 tablet        1 tablet Oral Daily 07/28/20 2148     07/28/20 2130  cefTRIAXone (ROCEPHIN) 2 g in sodium chloride 0.9 % 100 mL IVPB  Status:  Discontinued        2  g 200 mL/hr over 30 Minutes Intravenous  Once 07/28/20 2130 07/28/20 2142   07/28/20 2100  vancomycin (VANCOREADY) IVPB 1500 mg/300 mL        1,500 mg 150 mL/hr over 120 Minutes Intravenous  Once 07/28/20 2027 07/29/20 0231   07/28/20 2030  anidulafungin (ERAXIS) 200 mg in sodium chloride 0.9 % 200 mL IVPB  Status:  Discontinued  200 mg 78 mL/hr over 200 Minutes Intravenous Every 24 hours 07/28/20 2027 07/28/20 2036   07/28/20 2030  metroNIDAZOLE (FLAGYL) IVPB 500 mg  Status:  Discontinued        500 mg 100 mL/hr over 60 Minutes Intravenous  Once 07/28/20 2027 07/28/20 2127       I have personally reviewed the following labs and images: CBC: Recent Labs  Lab 08/11/20 0904 08/14/20 1124  WBC 7.2 8.4  NEUTROABS  --  3.1  HGB 11.9* 12.8*  HCT 34.9* 38.7*  MCV 105.8* 105.7*  PLT 322 358   BMP &GFR Recent Labs  Lab 08/11/20 0904 08/14/20 1124  NA 133* 136  K 4.2 3.5  CL 102 101  CO2 21* 25  GLUCOSE 98 113*  BUN 10 9  CREATININE 0.68 0.71  CALCIUM 9.3 9.6   Estimated Creatinine Clearance: 124.5 mL/min (by C-G formula based on SCr of 0.71 mg/dL). Liver & Pancreas: Recent Labs  Lab 08/14/20 1124  AST 37  ALT 50*  ALKPHOS 151*  BILITOT 0.4  PROT 9.9*  ALBUMIN 3.3*   No results for input(s): LIPASE, AMYLASE in the last 168 hours. No results for input(s): AMMONIA in the last 168 hours. Diabetic: No results for input(s): HGBA1C in the last 72 hours. No results for input(s): GLUCAP in the last 168 hours. Cardiac Enzymes: No results for input(s): CKTOTAL, CKMB, CKMBINDEX, TROPONINI in the last 168 hours. No results for input(s): PROBNP in the last 8760 hours. Coagulation Profile: No results for input(s): INR, PROTIME in the last 168 hours. Thyroid Function Tests: No results for input(s): TSH, T4TOTAL, FREET4, T3FREE, THYROIDAB in the last 72 hours. Lipid Profile: No results for input(s): CHOL, HDL, LDLCALC, TRIG, CHOLHDL, LDLDIRECT in the last 72  hours. Anemia Panel: No results for input(s): VITAMINB12, FOLATE, FERRITIN, TIBC, IRON, RETICCTPCT in the last 72 hours. Urine analysis:    Component Value Date/Time   COLORURINE AMBER (A) 07/28/2020 1303   APPEARANCEUR CLEAR 07/28/2020 1303   LABSPEC 1.012 07/28/2020 1303   PHURINE 6.0 07/28/2020 1303   GLUCOSEU NEGATIVE 07/28/2020 1303   HGBUR NEGATIVE 07/28/2020 1303   BILIRUBINUR SMALL (A) 07/28/2020 1303   KETONESUR NEGATIVE 07/28/2020 1303   PROTEINUR NEGATIVE 07/28/2020 1303   NITRITE NEGATIVE 07/28/2020 1303   LEUKOCYTESUR TRACE (A) 07/28/2020 1303   Sepsis Labs: Invalid input(s): PROCALCITONIN, LACTICIDVEN  Microbiology: Recent Results (from the past 240 hour(s))  Aerobic/Anaerobic Culture (surgical/deep wound)     Status: None (Preliminary result)   Collection Time: 08/14/20  4:08 PM   Specimen: Wound; Abscess  Result Value Ref Range Status   Specimen Description WOUND LEFT AXILLA  Final   Special Requests Immunocompromised  Final   Gram Stain   Final    ABUNDANT WBC PRESENT, PREDOMINANTLY PMN FEW GRAM POSITIVE COCCI    Culture   Final    MODERATE STAPHYLOCOCCUS LUGDUNENSIS NO ANAEROBES ISOLATED; CULTURE IN PROGRESS FOR 5 DAYS SUSCEPTIBILITIES TO FOLLOW Performed at [facility], [address], [place], [place] [address]    Report Status PENDING  Incomplete    Radiology Studies: No results found.    Travis Mcgee T. Toneshia Coello [facility]  If 7PM-7AM, please contact night-coverage [email or web address] 08/16/2020, 3:42 PM

## 2020-08-16 NOTE — Progress Notes (Addendum)
Pharmacy Antibiotic Note  Travis Mcgee is a 39 y.o. male admitted on 07/28/2020 with L axillary abscess.  Pharmacy has been consulted for vancomycin dosing.  Patient has a PMH of HIV on Biktarvy; also on Bactrim for PCP prophylaxis.  Last CD4 count 90 (07/30/20)  Wound aspirated by surgery 10/19 and cultures were sent - still pending.  Surgery would like to avoid further I&D due to concern for poor wound healing because of decreased immune system. Patient endorsing tenderness at abscess site.  Seen with infectious diseases team - recommending I&D and will discuss further with surgery.  Escalating antibiotics to vancomycin while waiting for further culture data and surgery plans.    WBC 8.4, afebrile  Plan: Discontinue Unasyn Vancomycin 1500 mg IV x1 Vancomycin 1500 mg IV every 12 hours.  Goal trough 10-15 mcg/mL.  Monitor wound culture data, renal function, and VT at steady state as clinically indicated F/u further I&D plans  Height: 5\' 11"  (180.3 cm) Weight: 71.7 kg (158 lb 1.6 oz) IBW/kg (Calculated) : 75.3  Temp (24hrs), Avg:98.2 F (36.8 C), Min:98 F (36.7 C), Max:98.6 F (37 C)  Recent Labs  Lab 08/11/20 0904 08/14/20 1124  WBC 7.2 8.4  CREATININE 0.68 0.71    Estimated Creatinine Clearance: 124.5 mL/min (by C-G formula based on SCr of 0.71 mg/dL).    No Known Allergies  Antimicrobials this admission: PTA Bactrim 10/3 >>  Unasyn 10/20 >> 10/21 Vancomycin 10/21 >>   Microbiology results: 10/19 WCx: few gram pos cocci, reincubated  Thank you for allowing pharmacy to be a part of this patient's care.  11/19, PharmD PGY-1 Acute Care Pharmacy Resident Office: [phone] 08/16/2020 11:06 AM

## 2020-08-16 NOTE — Progress Notes (Signed)
[facility]  Date of Admission:  07/28/2020     Total days of antibiotics 20         ASSESSMENT:  Mr. Pettet had increased discomfort with his left axilla which is now s/p I&D with purulence obtained. Given the abscess formation this is likely to be Staph or Strep and will change Unasyn to Vancomycin pending culture results. May only need a short course now that his abscess has been drained.  PLAN:  1. Change Unasyn to Vancomycin 2. Continue wound care per General Surgery 3. Continue Biktarvy and Bactrim for HIV/AIDS.    Principal Problem:   Guillain Barr syndrome (HCC) Active Problems:   AIDS (acquired immune deficiency syndrome) (HCC)   History of syphilis   Lactic acidosis   Malnutrition of moderate degree   Axillary abscess   . bictegravir-emtricitabine-tenofovir AF  1 tablet Oral Daily  . DULoxetine  20 mg Oral Daily  . enoxaparin (LOVENOX) injection  40 mg Subcutaneous Q24H  . feeding supplement  237 mL Oral Q24H  . gabapentin  900 mg Oral TID  . levothyroxine  25 mcg Oral Q0600  . methocarbamol  500 mg Oral TID  . multivitamin with minerals  1 tablet Oral Daily  . sulfamethoxazole-trimethoprim  1 tablet Oral Daily  . thiamine injection  100 mg Intravenous Daily   Or  . thiamine  100 mg Oral Daily  . vitamin B-12  1,000 mcg Oral Daily    SUBJECTIVE:  Afebrile overnight with no acute events. Continues to have pain under his left axilla. Working on putting warm compresses on it.   No Known Allergies   Review of Systems: Review of Systems  Constitutional: Negative for chills, fever and weight loss.  Respiratory: Negative for cough, shortness of breath and wheezing.   Cardiovascular: Negative for chest pain and leg swelling.  Gastrointestinal: Negative for abdominal pain, constipation, diarrhea, nausea and vomiting.  Skin: Negative for rash.       Positive for abscess in left axilla       OBJECTIVE: Vitals:   08/15/20  2310 08/16/20 0308 08/16/20 0415 08/16/20 0734  BP: 111/72  107/75 118/84  Pulse: 81  (!) 101   Resp: 18  17 16   Temp: 98 F (36.7 C)  98.3 F (36.8 C) 98 F (36.7 C)  TempSrc: Oral  Oral Oral  SpO2: 98%  97% 99%  Weight:  71.7 kg    Height:       Body mass index is 22.05 kg/m.  Physical Exam Constitutional:      General: He is not in acute distress.    Appearance: He is well-developed.  Cardiovascular:     Rate and Rhythm: Normal rate and regular rhythm.     Heart sounds: Normal heart sounds.  Pulmonary:     Effort: Pulmonary effort is normal.     Breath sounds: Normal breath sounds.  Skin:    General: Skin is warm and dry.  Neurological:     Mental Status: He is alert and oriented to person, place, and time.  Psychiatric:        Behavior: Behavior normal.        Thought Content: Thought content normal.        Judgment: Judgment normal.     Lab Results Lab Results  Component Value Date   WBC 8.4 08/14/2020   HGB 12.8 (L) 08/14/2020   HCT 38.7 (L) 08/14/2020   MCV 105.7 (H) 08/14/2020  PLT 358 08/14/2020    Lab Results  Component Value Date   CREATININE 0.71 08/14/2020   BUN 9 08/14/2020   NA 136 08/14/2020   K 3.5 08/14/2020   CL 101 08/14/2020   CO2 25 08/14/2020    Lab Results  Component Value Date   ALT 50 (H) 08/14/2020   AST 37 08/14/2020   ALKPHOS 151 (H) 08/14/2020   BILITOT 0.4 08/14/2020     Microbiology: Recent Results (from the past 240 hour(s))  Aerobic/Anaerobic Culture (surgical/deep wound)     Status: None (Preliminary result)   Collection Time: 08/14/20  4:08 PM   Specimen: Wound; Abscess  Result Value Ref Range Status   Specimen Description WOUND LEFT AXILLA  Final   Special Requests Immunocompromised  Final   Gram Stain   Final    ABUNDANT WBC PRESENT, PREDOMINANTLY PMN FEW GRAM POSITIVE COCCI Performed at [facility], [address], [place], [place] [address]    Culture   Final    CULTURE REINCUBATED FOR BETTER  GROWTH NO ANAEROBES ISOLATED; CULTURE IN PROGRESS FOR 5 DAYS    Report Status PENDING  Incomplete     Marcos Eke, NP [facility]  08/16/2020  12:11 PM

## 2020-08-16 NOTE — Progress Notes (Signed)
[facility] Progress Note     Subjective: Patient reports he feels like axillary abscess is maybe slightly enlarged today. Still tender, no drainage.   Objective: Vital signs in last 24 hours: Temp:  [98 F (36.7 C)-98.6 F (37 C)] 98 F (36.7 C) (10/21 0734) Pulse Rate:  [81-107] 101 (10/21 0415) Resp:  [15-20] 16 (10/21 0734) BP: (107-120)/(72-84) 118/84 (10/21 0734) SpO2:  [95 %-99 %] 99 % (10/21 0734) Weight:  [71.7 kg] 71.7 kg (10/21 0308) Last BM Date: 08/14/20  Intake/Output from previous day: 10/20 0701 - 10/21 0700 In: 298.2 [IV Piggyback:298.2] Out: 1100 [Urine:1100] Intake/Output this shift: Total I/O In: -  Out: 600 [Urine:600]  PE: Constitutional: NAD; conversant; no deformities Eyes: Moist conjunctiva; no lid lag; anicteric; PERRL Neck: Trachea midline; no thyromegaly Lungs: Normal respiratory effort; no tactile fremitus CV: RRR; no palpable thrills; no pitting edema GI: Abdnontender; no palpable hepatosplenomegaly MSK: Normal gait; no clubbing/cyanosis Left axilla - abscess appears ~3cm with some increased central fluctuance today, no spontaneous drainage Psychiatric: Appropriate affect; alert and oriented x3 Lymphatic: No palpable cervical or axillary lymphadenopathy   Lab Results:  Recent Labs    08/14/20 1124  WBC 8.4  HGB 12.8*  HCT 38.7*  PLT 358   BMET Recent Labs    08/14/20 1124  NA 136  K 3.5  CL 101  CO2 25  GLUCOSE 113*  BUN 9  CREATININE 0.71  CALCIUM 9.6   PT/INR No results for input(s): LABPROT, INR in the last 72 hours. CMP     Component Value Date/Time   NA 136 08/14/2020 1124   K 3.5 08/14/2020 1124   CL 101 08/14/2020 1124   CO2 25 08/14/2020 1124   GLUCOSE 113 (H) 08/14/2020 1124   BUN 9 08/14/2020 1124   CREATININE 0.71 08/14/2020 1124   CREATININE 0.77 11/30/2019 1645   CALCIUM 9.6 08/14/2020 1124   PROT 9.9 (H) 08/14/2020 1124   ALBUMIN 3.3 (L) 08/14/2020 1124   ALBUMIN 2.8 (L)  07/28/2020 2128   AST 37 08/14/2020 1124   ALT 50 (H) 08/14/2020 1124   ALKPHOS 151 (H) 08/14/2020 1124   BILITOT 0.4 08/14/2020 1124   GFRNONAA >60 08/14/2020 1124   GFRNONAA 114 11/30/2019 1645   GFRAA >60 07/30/2020 0623   GFRAA 132 11/30/2019 1645   Lipase  No results found for: LIPASE     Studies/Results: [place] CHEST SOFT TISSUE  Result Date: 08/14/2020 CLINICAL DATA:  Left armpit swelling EXAM: ULTRASOUND OF HEAD/NECK SOFT TISSUES TECHNIQUE: Ultrasound examination of the head and neck soft tissues was performed in the area of clinical concern. COMPARISON:  CT 11/29/2017 FINDINGS: Targeted sonography of the left axillary region is performed. In the region of swelling, there is a complex fluid collection measuring 3.3 x 0.8 x 1.9 cm. Adjacent enlarged lymph node measuring 1.3 cm with slight eccentric cortical thickening. IMPRESSION: 1. 3.3 cm complex fluid collection in the left axilla corresponding to swelling. This may reflect infected or inflammatory fluid collection. 2. Slightly enlarged left axillary lymph node, possibly reactive. Electronically Signed   By: Jasmine Pang M.D.   On: 08/14/2020 18:50    Anti-infectives: Anti-infectives (From admission, onward)   Start     Dose/Rate Route Frequency Ordered Stop   08/15/20 0930  Ampicillin-Sulbactam (UNASYN) 3 g in sodium chloride 0.9 % 100 mL IVPB        3 g 200 mL/hr over 30 Minutes Intravenous Every 6 hours 08/15/20 0845  08/15/20 0000  sulfamethoxazole-trimethoprim (BACTRIM DS) 800-160 MG tablet        1 tablet Oral 3 times weekly 08/14/20 1055 09/14/20 2359   08/15/20 0000  bictegravir-emtricitabine-tenofovir AF (BIKTARVY) 50-200-25 MG TABS tablet        1 tablet Oral Daily 08/14/20 1055 09/14/20 2359   08/03/20 1500  penicillin G potassium 6 Million Units in dextrose 5 % 500 mL continuous infusion        6 Million Units 41.7 mL/hr over 12 Hours Intravenous Every 12 hours 08/03/20 1407 08/13/20 1559   08/03/20 1200   penicillin G potassium 2 Million Units in dextrose 5 % 50 mL IVPB  Status:  Discontinued        2 Million Units 100 mL/hr over 30 Minutes Intravenous Every 4 hours 08/03/20 1136 08/03/20 1407   07/29/20 2200  anidulafungin (ERAXIS) 100 mg in sodium chloride 0.9 % 100 mL IVPB  Status:  Discontinued       "Followed by" Linked Group Details   100 mg 78 mL/hr over 100 Minutes Intravenous Every 24 hours 07/28/20 2037 07/28/20 2127   07/29/20 1715  sulfamethoxazole-trimethoprim (BACTRIM DS) 800-160 MG per tablet 1 tablet        1 tablet Oral Daily 07/29/20 1701     07/29/20 0900  vancomycin (VANCOREADY) IVPB 1250 mg/250 mL  Status:  Discontinued        1,250 mg 166.7 mL/hr over 90 Minutes Intravenous Every 12 hours 07/28/20 2049 07/29/20 1142   07/28/20 2300  acyclovir (ZOVIRAX) 660 mg in dextrose 5 % 100 mL IVPB  Status:  Discontinued        10 mg/kg  65.8 kg 113.2 mL/hr over 60 Minutes Intravenous Every 8 hours 07/28/20 2126 07/28/20 2129   07/28/20 2200  ampicillin (OMNIPEN) 2 g in sodium chloride 0.9 % 100 mL IVPB  Status:  Discontinued        2 g 300 mL/hr over 20 Minutes Intravenous Every 4 hours 07/28/20 2027 07/28/20 2127   07/28/20 2200  ceFEPIme (MAXIPIME) 2 g in sodium chloride 0.9 % 100 mL IVPB  Status:  Discontinued        2 g 200 mL/hr over 30 Minutes Intravenous Every 8 hours 07/28/20 2027 07/28/20 2127   07/28/20 2200  anidulafungin (ERAXIS) 200 mg in sodium chloride 0.9 % 200 mL IVPB  Status:  Discontinued       "Followed by" Linked Group Details   200 mg 78 mL/hr over 200 Minutes Intravenous  Once 07/28/20 2037 07/28/20 2127   07/28/20 2200  cefTRIAXone (ROCEPHIN) 2 g in sodium chloride 0.9 % 100 mL IVPB  Status:  Discontinued        2 g 200 mL/hr over 30 Minutes Intravenous Every 12 hours 07/28/20 2142 07/29/20 1142   07/28/20 2200  bictegravir-emtricitabine-tenofovir AF (BIKTARVY) 50-200-25 MG per tablet 1 tablet        1 tablet Oral Daily 07/28/20 2148     07/28/20 2130   cefTRIAXone (ROCEPHIN) 2 g in sodium chloride 0.9 % 100 mL IVPB  Status:  Discontinued        2 g 200 mL/hr over 30 Minutes Intravenous  Once 07/28/20 2130 07/28/20 2142   07/28/20 2100  vancomycin (VANCOREADY) IVPB 1500 mg/300 mL        1,500 mg 150 mL/hr over 120 Minutes Intravenous  Once 07/28/20 2027 07/29/20 0231   07/28/20 2030  anidulafungin (ERAXIS) 200 mg in sodium chloride 0.9 % 200 mL IVPB  Status:  Discontinued        200 mg 78 mL/hr over 200 Minutes Intravenous Every 24 hours 07/28/20 2027 07/28/20 2036   07/28/20 2030  metroNIDAZOLE (FLAGYL) IVPB 500 mg  Status:  Discontinued        500 mg 100 mL/hr over 60 Minutes Intravenous  Once 07/28/20 2027 07/28/20 2127       Assessment/Plan Advanced HIV/AIDS PMH syphilis  guillain-barre syndrome  Leftaxillaryabscess - wound aspirated and sent for aerobic and anaerobic cultures with few G+ cocci - left axillary ultrasound with 3.3 x 0.8 x 1.9 cm complex fluid that may reflect infected vs inflammatory fluid - patient still with fluctuant area in L axilla, will try bedside I&D today and pack with iodoform - patient could back iodoform out slowly over the next few days and go home on PO abx 7-10 days - warm compresses - will defer abx selection to ID   FEN: reg diet  VTE: lovenox ID: Biktarvy HART; PO bactrim 10/3>>, IV unasyn 10/20>>  LOS: 19 days    Juliet Rude , [facility] 08/16/2020, 9:44 AM Please see Amion for pager number during day hours 7:00am-4:30pm

## 2020-08-16 NOTE — Procedures (Addendum)
Incision and Drainage Procedure Note  Pre-operative Diagnosis: left axillary abscess  Post-operative Diagnosis: same  Indications: persistent abscess s/p needle aspiration  Anesthesia: 1.5% lidocaine with epinephrine  Procedure Details  The procedure, risks and complications have been discussed in detail (including, but not limited to infection, bleeding) with the patient, and the patient has signed consent to the procedure.    The skin was sterilely prepped and draped over the affected area in the usual fashion. After adequate local anesthesia, an #11 blade was used to make a 1 cm ellipse incision on the left axilla. Purulent drainage obtained, hemostate used to break up all loculations yielding more purulence.  The patient remained stable throughout the procedure.  Findings:  purulence   EBL: minimal  Drains: none  Condition: Tolerated procedure well   Complications: none   Wound care: wound was packed with 1/4" iodoform gauze. Patient may remove packing in 48 hours (Saturday 10/23). After that he should shower daily with the wound open using mild soap and water. Cover area with dry gauze and tape until it closes. Would continue warm compresses with K pad or heating pad to encourage wound drainage. Call our office as needed for new cellulitis (redness) of the area, fever, or recurrence of fluctuance. Office information placed in AVS.  General surgery will sign off at this time but will remain available as needed.   Hosie Spangle, PA-C [facility], [place].A.

## 2020-08-16 NOTE — Progress Notes (Signed)
PT Cancellation Note  Patient Details Name: Travis Mcgee MRN: [id number] DOB: 11/24/1980   Cancelled Treatment:    Reason Eval/Treat Not Completed: (P) Patient declined, no reason specified (Pt eating and decline PT at this time and asked for PTA to return later this pm.  Will f/u per POC.)   [name] Artis Delay 08/16/2020, 2:29 PM  [name] , PTA Acute Rehabilitation Services Pager [phone] Office [phone]

## 2020-08-16 NOTE — Progress Notes (Addendum)
Physical Therapy Treatment Patient Details Name: Travis Mcgee MRN: [id number] DOB: 1981-10-07 Today's Date: 08/16/2020    History of Present Illness Travis Mcgee is a 39 y.o. male with medical history significant of HIV/AIDS had been on HAART.Pt presents to ED with c/o pain and weakness in BLE.  Thought to be secondary to Guillain barre and was started on IVIG.     PT Comments    Pt supine in bed and guarding L arm inward to midline due to pain in L axillae.  He refused OOB and focus of session was on LE strengthening as he remains to present with weak hip flexors. Updated recommendations to no PT follow up as he was observed in halls ambulating independently.  CM informed of need for [facility] as d/c for aide in uneven surfaces as he has had multiple falls in the past.  Informed supervising PT of need for change in recommendations at this time.     Follow Up Recommendations  No PT follow up     Equipment Recommendations  Cane    Recommendations for Other Services       Precautions / Restrictions Precautions Precautions: Fall Precaution Comments: Has had multiple falls.  Restrictions Weight Bearing Restrictions: No    Mobility  Bed Mobility                  Transfers                    Ambulation/Gait                 Stairs             Wheelchair Mobility    Modified Rankin (Stroke Patients Only)       Balance                                            Cognition Arousal/Alertness: Awake/alert Behavior During Therapy: WFL for tasks assessed/performed Overall Cognitive Status: Within Functional Limits for tasks assessed                                 General Comments: overall WFL for simple tasks      Exercises General Exercises - Lower Extremity Ankle Circles/Pumps: AROM;Both;20 reps;Supine Quad Sets: AROM;Both;10 reps;Supine Heel Slides: AROM;Both;10 reps;Supine Hip ABduction/ADduction:  AROM;Both;10 reps;Supine Straight Leg Raises: AROM;Both;10 reps;Supine    General Comments        Pertinent Vitals/Pain Pain Assessment: Faces Pain Score: 10-Worst pain ever (10/10 for L axilla and 5/10 B LEs) Faces Pain Scale: Hurts a little bit Pain Location: B LEs ( quads ) and L axilla Pain Descriptors / Indicators: Pins and needles;Tingling;Numbness Pain Intervention(s): Monitored during session;Repositioned    Home Living                      Prior Function            PT Goals (current goals can now be found in the care plan section) Acute Rehab PT Goals Patient Stated Goal: to get back to work Potential to Achieve Goals: Fair Progress towards PT goals: Progressing toward goals    Frequency    Min 2X/week      PT Plan Current plan remains appropriate    Co-evaluation  AM-PAC PT "6 Clicks" Mobility   Outcome Measure  Help needed turning from your back to your side while in a flat bed without using bedrails?: None Help needed moving from lying on your back to sitting on the side of a flat bed without using bedrails?: None Help needed moving to and from a bed to a chair (including a wheelchair)?: None Help needed standing up from a chair using your arms (e.g., wheelchair or bedside chair)?: None Help needed to walk in hospital room?: None Help needed climbing 3-5 steps with a railing? : A Little 6 Click Score: 23    End of Session   Activity Tolerance: Patient limited by pain (refused OOB due to pain from I/D to L axilla) Patient left: in bed;with call bell/phone within reach Nurse Communication: Mobility status PT Visit Diagnosis: Unsteadiness on feet (R26.81);Muscle weakness (generalized) (M62.81)     Time: 5956-3875 PT Time Calculation (min) (ACUTE ONLY): 12 min  Charges:  $Therapeutic Exercise: 8-22 mins                     [name] , PTA Acute Rehabilitation Services Pager [phone] Office  [phone]     Travis Mcgee Artis Delay 08/16/2020, 6:06 PM

## 2020-08-16 NOTE — Progress Notes (Signed)
   Covid-19 Vaccination Clinic  Name:  Aneudy Champlain    MRN: [id number] DOB: 06-Feb-1981  08/16/2020  Mr. Petrosyan was observed post Covid-19 immunization for 15 minutes without incident. He was provided with Vaccine Information Sheet and instruction to access the V-Safe system.   Mr. Arnott was instructed to call 911 with any severe reactions post vaccine: Marland Kitchen Difficulty breathing  . Swelling of face and throat  . A fast heartbeat  . A bad rash all over body  . Dizziness and weakness   Immunizations Administered    Name Date Dose VIS Date Route   Pfizer COVID-19 Vaccine 08/16/2020  1:07 PM 0.3 mL 12/21/2018 Intramuscular   Manufacturer: [employer], [employer]   Lot: [id number]   NDC: [address]-0

## 2020-08-17 ENCOUNTER — Other Ambulatory Visit (HOSPITAL_COMMUNITY): Payer: Self-pay | Admitting: Student

## 2020-08-17 LAB — MISC LABCORP TEST (SEND OUT): Labcorp test code: 7056

## 2020-08-17 MED ORDER — AMOXICILLIN-POT CLAVULANATE 875-125 MG PO TABS
1.0000 | ORAL_TABLET | Freq: Two times a day (BID) | ORAL | 0 refills | Status: DC
Start: 2020-08-17 — End: 2020-08-17

## 2020-08-17 MED ORDER — TRAMADOL HCL 50 MG PO TABS
50.0000 mg | ORAL_TABLET | Freq: Four times a day (QID) | ORAL | 0 refills | Status: DC | PRN
Start: 2020-08-17 — End: 2020-08-17

## 2020-08-17 MED ORDER — ADULT MULTIVITAMIN W/MINERALS CH
1.0000 | ORAL_TABLET | Freq: Every day | ORAL | 1 refills | Status: DC
Start: 2020-08-18 — End: 2021-02-11

## 2020-08-17 MED ORDER — AMOXICILLIN-POT CLAVULANATE 875-125 MG PO TABS
1.0000 | ORAL_TABLET | Freq: Two times a day (BID) | ORAL | Status: DC
  Administered 2020-08-17 – 2020-08-18 (×2): 1 via ORAL
  Filled 2020-08-17 (×2): qty 1

## 2020-08-17 MED ORDER — CYANOCOBALAMIN 1000 MCG PO TABS: 1000.0000 ug | ORAL_TABLET | Freq: Every day | ORAL | Status: AC

## 2020-08-17 MED ORDER — GABAPENTIN 300 MG PO CAPS
900.0000 mg | ORAL_CAPSULE | Freq: Three times a day (TID) | ORAL | 1 refills | Status: DC
Start: 2020-08-17 — End: 2020-08-17

## 2020-08-17 MED ORDER — DULOXETINE HCL 20 MG PO CPEP
20.0000 mg | ORAL_CAPSULE | Freq: Every day | ORAL | 1 refills | Status: DC
Start: 2020-08-18 — End: 2020-08-18

## 2020-08-17 MED ORDER — LEVOTHYROXINE SODIUM 25 MCG PO TABS: 25.0000 ug | ORAL_TABLET | Freq: Every day | ORAL | 1 refills | Status: DC

## 2020-08-17 MED ORDER — ACETAMINOPHEN 500 MG PO TABS: 500.0000 mg | ORAL_TABLET | Freq: Three times a day (TID) | ORAL | 0 refills | Status: AC

## 2020-08-17 MED FILL — AMOX-CLAV 875-125 MG TABLET: 875-125 | 30 days supply | Qty: 60 | Fill #0

## 2020-08-17 MED FILL — LEVOTHYROXINE SODIUM 25 MCG: 25 | 30 days supply | Qty: 30 | Fill #0

## 2020-08-17 MED FILL — DULoxetine HCL 20 MG CPEP: 20 | 30 days supply | Qty: 30 | Fill #0

## 2020-08-17 MED FILL — GABAPENTIN 300 MG CAPSULE: 300 | 30 days supply | Qty: 270 | Fill #0

## 2020-08-17 MED FILL — traMADol HCL 50 MG TABS: 50 | 7 days supply | Qty: 28 | Fill #0

## 2020-08-17 NOTE — TOC Progression Note (Signed)
Transition of Care [facility]) - Progression Note    Patient Details  Name: Travis Mcgee MRN: [id number] Date of Birth: [birth year]  [facility] of [facility] CM/SW Contact  Eduard Roux, [place] Phone Number: 08/17/2020, 9:42 PM  Clinical Narrative:     CSW provided patient with bus passes.   CSW gave the patient [place] number- patient states he was able to reach his [facility] SW that was assisting him with securing housing.  Antony Blackbird, MSW, LCSWA Clinical Social Worker   Expected Discharge Plan: Skilled Nursing Facility Barriers to Discharge: Barriers Resolved  Expected Discharge Plan and Services Expected Discharge Plan: Skilled Nursing Facility In-house Referral: Clinical Social Work Discharge Planning Services: CM Consult, MATCH Program, Medication Assistance   Living arrangements for the past 2 months:  (lives in  an shed on friend property) Expected Discharge Date: 08/17/20               DME Arranged: [place] DME Agency: [employer] Date DME Agency Contacted: 08/17/20 Time DME Agency Contacted: 1630 Representative spoke with at DME Agency: Silvio Pate HH Arranged: NA HH Agency: NA         Social Determinants of Health (SDOH) Interventions    Readmission Risk Interventions Readmission Risk Prevention Plan 08/17/2020  PCP or Specialist Appt within 5-7 Days Complete  Home Care Screening Complete  Medication Review (RN CM) Complete  Some recent data might be hidden

## 2020-08-17 NOTE — Discharge Summary (Signed)
Physician Discharge Summary  Travis Mcgee WUJ:[id number] DOB: 10-03-1981 DOA: 07/28/2020  PCP: Patient, No Pcp Per  Admit date: 07/28/2020 Discharge date: 08/17/2020  Admitted From: Home.  Lives in a shade Disposition:  Shade  Recommendations for Outpatient Follow-up:  1. Follow ups as below. 2. Please obtain CBC/BMP/Mag at follow up 3. Please follow up on the following pending results: Acid-fast smear  Home Health: None Equipment/Devices: Cane  Discharge Condition: Stable CODE STATUS: Full code   Follow-up Information    Surgery, [facility] [place] [place] Follow up.   Specialty: General Surgery Why: No follow up required, call our office as needed if you develop concerns regarding your left axilla (arm pit). Contact information: [address] [place] [address] [phone]               Hospital Course: 39 year old male with PMH of HIV/AIDS and latent syphilis presenting with progressive BLE pain, paresthesia, weakness and recurrent falls, and admitted with working diagnosis of [name] syndrome.  CTA head, MRI brain, MRI cervical, thoracic and lumbar spine without significant finding.  Neurology consulted.  Underwent lumbar puncture.  CSF fluid culture, cryptococcus antigen and VDRL negative.  He had elevated CSF IgG/albumin ratio consistent with intrathecal antibody production.  He received IVIG for 5 days.  However, continued to have BLE weakness and painful paresthesias.  There was also concern about possible neurosyphilis and started on IV penicillin that he completed on 08/12/2020.  He has elevated Borrelia antibodies, IgM/IgG but no recent tick exposure.  Western blot was negative as well.  Initially, therapy recommended SNF, but clinically improved, and he only needed a cane on discharge.  Patient has had a new finding of left axillary abscess on 10/19.  General surgery consulted and performed needle aspiration of 2 cc purulent pus. Started on IV  Unasyn on 10/20.  ID reconsulted, and recommended I&D to evacuate abscess, continuing IV Unasyn and sent labs for HIV RNA, CD4 and AFB blood cultures.   Wound culture grew   Culture showed Staphylococcus Lugdunensis. Patient underwent I&D on 10/22.  Antibiotics changed to IV vancomycin and deescalated to Augmentin for 7 more days on discharge per ID recommendation.  Wound care instruction provided by surgery.  Follow-up with surgery as needed. ID to arrange outpatient follow-up.  See individual problem list below for more on  hospital course.  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  S/p IVIG for 5 days and IV penicillin for 10 days.    Significant improvement in his ambulation but continues to endorse pain and paresthesia. -Continue duloxetine, gabapentin, tramadol and thiamine. -Can on discharge.  Recurrent falls: Likely due to the above.  Now he ambulates independently. -Pain on discharge.  Left axillary abscess: Needle aspiration on 10/19.  Abscess culture grew staphylococcus Lugdunensis.  I&D with wound packing on 10/21. -IV Unasyn 10/20-10/21>> vancomycin 10/21-10/22>> Augmentin for 7 more days -Wound care instruction by surgery as below -remove packing strip in 48 hours (Saturday 10/23). After that shower daily with the wound open using mild soap and water. Cover area with dry gauze and tape until it closes. Call our office as needed for new cellulitis (redness) of the area, fever, or recurrence of fluctuance/swelling.   HIV/ AIDS: Absolute CD4 197.  HIV viral load 450.  Improved compared with his previous labs. -On Biktarvy and prophylactic dose Bactrim DS for PJP and toxo -Outpatient follow-up with ID  Anemia of chronic disease/macrocytosis-H&H stable.  B12 and folic acid within normal -Recheck CBC at follow-up.  Hypothyroidism: Slightly elevated TSH with low free T4. -Continue Synthroid 25 mcg  daily -Recheck TFT in 3 to 4 weeks  Hyponatremia: Resolved.  Hypokalemia:resolved.  Debility/physical deconditioning: Seems to have resolved.  Ambulating independently. -Cane.  COVID-19 vaccination: -Received the first dose of Pfizer vaccine on 10/21 -Second dose in 3 to 4 weeks  Moderate protein calorie malnutrition: -Multivitamins Body mass index is 22.05 kg/m. Nutrition Problem: Moderate Malnutrition Etiology: chronic illness (HIV) Signs/Symptoms: mild fat depletion, severe muscle depletion Interventions: Ensure Enlive (each supplement provides 350kcal and 20 grams of protein), MVI      Discharge Exam: Vitals:   08/17/20 0305 08/17/20 0735  BP: 115/84 112/75  Pulse: (!) 57 96  Resp: 18 17  Temp: 98.1 F (36.7 C) 98.3 F (36.8 C)  SpO2: 99% 98%    GENERAL: No apparent distress.  Nontoxic. HEENT: MMM.  Vision and hearing grossly intact.  NECK: Supple.  No apparent JVD.  RESP:  No IWOB.  Fair aeration bilaterally. CVS:  RRR. Heart sounds normal.  ABD/GI/GU: Bowel sounds present. Soft. Non tender.  MSK/EXT:  Moves extremities. No apparent deformity. No edema.  SKIN: I&D of right axillary abscess with wound VAC.  NEURO: Awake, alert and oriented appropriately.  No apparent focal neuro deficit. PSYCH: Calm. Normal affect.  Discharge Instructions  Discharge Instructions    Call MD for:  extreme fatigue   Complete by: As directed    Call MD for:  persistant dizziness or light-headedness   Complete by: As directed    Call MD for:  redness, tenderness, or signs of infection (pain, swelling, redness, odor or green/yellow discharge around incision site)   Complete by: As directed    Call MD for:  severe uncontrolled pain   Complete by: As directed    Call MD for:  temperature >100.4   Complete by: As directed    Diet general   Complete by: As directed    Discharge instructions   Complete by: As directed    It has been a pleasure taking care of  you!  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  Your symptoms improved.  We are discharging you on antibiotics for left axillary abscess to complete treatment course.  Follow the instruction by surgery about wound care.  You may follow-up with surgery if concern with your wound or delayed healing.  Also follow-up with infectious disease doctors.  Place review your medication list and the directions on your medications before you take them.   Take care,   For home use only DME Cane   Complete by: As directed    Increase activity slowly   Complete by: As directed      Allergies as of 08/17/2020   No Known Allergies     Medication List    STOP taking these medications   azithromycin 600 MG tablet Commonly known as: ZITHROMAX   fluconazole 200 MG tablet Commonly known as: DIFLUCAN   mirtazapine 15 MG tablet Commonly known as: REMERON     TAKE these medications   acetaminophen 500 MG tablet Commonly known as: TYLENOL Take 1 tablet (500 mg total) by mouth every 8 (eight) hours for 10 days.   amoxicillin-clavulanate 875-125 MG tablet Commonly known as: AUGMENTIN Take 1 tablet by mouth every 12 (twelve) hours.   Biktarvy 50-200-25 MG Tabs tablet Generic drug: bictegravir-emtricitabine-tenofovir AF Take 1 tablet by mouth  daily.   cyanocobalamin 1000 MCG tablet Take 1 tablet (1,000 mcg total) by mouth daily. Start taking on: August 18, 2020   DULoxetine 20 MG capsule Commonly known as: CYMBALTA Take 1 capsule (20 mg total) by mouth daily. Start taking on: August 18, 2020   gabapentin 300 MG capsule Commonly known as: NEURONTIN Take 3 capsules (900 mg total) by mouth 3 (three) times daily.   levothyroxine 25 MCG tablet Commonly known as: SYNTHROID Take 1 tablet (25 mcg total) by mouth daily at 6 (six) AM. Start taking on: August 18, 2020   multivitamin with minerals Tabs tablet Take 1 tablet by  mouth daily. Start taking on: August 18, 2020   sulfamethoxazole-trimethoprim 800-160 MG tablet Commonly known as: BACTRIM DS Take 1 tablet by mouth 3 (three) times a week.   traMADol 50 MG tablet Commonly known as: ULTRAM Take 1 tablet (50 mg total) by mouth every 6 (six) hours as needed for up to 7 days for severe pain.            Durable Medical Equipment  (From admission, onward)         Start     Ordered   08/17/20 0000  For home use only DME Cane        08/17/20 1555          Consultations:  Infectious disease  Neurology  General surgery  Procedures/Studies:  08/16/2020 I&D of left axillary abscess   CT Head Wo Contrast  Result Date: 07/28/2020 CLINICAL DATA:  Lower extremity pain EXAM: CT HEAD WITHOUT CONTRAST TECHNIQUE: Contiguous axial images were obtained from the base of the skull through the vertex without intravenous contrast. COMPARISON:  None. FINDINGS: Brain: No evidence of acute infarction, hemorrhage, hydrocephalus, extra-axial collection or mass lesion/mass effect. Vascular: No hyperdense vessel or unexpected calcification. Skull: Normal. Negative for fracture or focal lesion. Sinuses/Orbits: The visualized paranasal sinuses are essentially clear. The mastoid air cells are unopacified. Other: None. IMPRESSION: Normal head CT. Electronically Signed   By: Charline Bills M.D.   On: 07/28/2020 18:46   MR BRAIN WO CONTRAST  Result Date: 07/28/2020 CLINICAL DATA:  Motor neuron disease.  Lower extremity pain. EXAM: MRI HEAD WITHOUT CONTRAST TECHNIQUE: Multiplanar, multiecho pulse sequences of the brain and surrounding structures were obtained without intravenous contrast. COMPARISON:  None. FINDINGS: BRAIN: No acute infarct, acute hemorrhage or extra-axial collection. Normal white matter signal. Normal volume of CSF spaces. No chronic microhemorrhage. Normal midline structures. VASCULAR: Major flow voids are preserved. SKULL AND UPPER CERVICAL SPINE:  Normal calvarium and skull base. Visualized upper cervical spine and soft tissues are normal. SINUSES/ORBITS: No paranasal sinus fluid levels or advanced mucosal thickening. No mastoid or middle ear effusion. Normal orbits. IMPRESSION: Normal brain MRI. Electronically Signed   By: Deatra Robinson M.D.   On: 07/28/2020 23:03   MR CERVICAL SPINE W WO CONTRAST  Result Date: 07/29/2020 CLINICAL DATA:  Lower extremity pain bilaterally.  HIV/AIDS EXAM: MRI CERVICAL, THORACIC AND LUMBAR SPINE WITHOUT AND WITH CONTRAST TECHNIQUE: Multiplanar and multiecho pulse sequences of the cervical spine, to include the craniocervical junction and cervicothoracic junction, and thoracic and lumbar spine, were obtained without and with intravenous contrast. CONTRAST:  40mL GADAVIST GADOBUTROL 1 MMOL/ML IV SOLN COMPARISON:  None. FINDINGS: MRI CERVICAL SPINE FINDINGS Alignment: Normal Vertebrae: No fracture, evidence of discitis, or bone lesion. Cord: Normal Posterior Fossa, vertebral arteries, paraspinal tissues: Negative. Disc levels: There is no disc herniation. No spinal canal or neural foraminal stenosis.  No abnormal contrast enhancement. MRI THORACIC SPINE FINDINGS Alignment:  Physiologic. Vertebrae: No fracture, evidence of discitis, or bone lesion. Cord:  Normal signal and morphology. Paraspinal and other soft tissues: Negative. Disc levels: No disc herniation, spinal canal stenosis or neural foraminal stenosis. No abnormal contrast enhancement. MRI LUMBAR SPINE FINDINGS Segmentation:  Standard Alignment:  Normal Vertebrae:  No fracture, evidence of discitis, or bone lesion. Conus medullaris and cauda equina: Conus extends to the L1 level. Conus and cauda equina appear normal. Paraspinal and other soft tissues: Negative Disc levels: All lumbar levels are normal. There is no disc herniation, spinal canal stenosis or neural foraminal stenosis. No abnormal contrast enhancement. IMPRESSION: Normal MRI of the cervical, thoracic and  lumbar spine. Electronically Signed   By: Deatra Robinson M.D.   On: 07/29/2020 00:41   MR THORACIC SPINE W WO CONTRAST  Result Date: 07/29/2020 CLINICAL DATA:  Lower extremity pain bilaterally.  HIV/AIDS EXAM: MRI CERVICAL, THORACIC AND LUMBAR SPINE WITHOUT AND WITH CONTRAST TECHNIQUE: Multiplanar and multiecho pulse sequences of the cervical spine, to include the craniocervical junction and cervicothoracic junction, and thoracic and lumbar spine, were obtained without and with intravenous contrast. CONTRAST:  7mL GADAVIST GADOBUTROL 1 MMOL/ML IV SOLN COMPARISON:  None. FINDINGS: MRI CERVICAL SPINE FINDINGS Alignment: Normal Vertebrae: No fracture, evidence of discitis, or bone lesion. Cord: Normal Posterior Fossa, vertebral arteries, paraspinal tissues: Negative. Disc levels: There is no disc herniation. No spinal canal or neural foraminal stenosis. No abnormal contrast enhancement. MRI THORACIC SPINE FINDINGS Alignment:  Physiologic. Vertebrae: No fracture, evidence of discitis, or bone lesion. Cord:  Normal signal and morphology. Paraspinal and other soft tissues: Negative. Disc levels: No disc herniation, spinal canal stenosis or neural foraminal stenosis. No abnormal contrast enhancement. MRI LUMBAR SPINE FINDINGS Segmentation:  Standard Alignment:  Normal Vertebrae:  No fracture, evidence of discitis, or bone lesion. Conus medullaris and cauda equina: Conus extends to the L1 level. Conus and cauda equina appear normal. Paraspinal and other soft tissues: Negative Disc levels: All lumbar levels are normal. There is no disc herniation, spinal canal stenosis or neural foraminal stenosis. No abnormal contrast enhancement. IMPRESSION: Normal MRI of the cervical, thoracic and lumbar spine. Electronically Signed   By: Deatra Robinson M.D.   On: 07/29/2020 00:41   MR Lumbar Spine W Wo Contrast  Result Date: 07/29/2020 CLINICAL DATA:  Lower extremity pain bilaterally.  HIV/AIDS EXAM: MRI CERVICAL, THORACIC AND  LUMBAR SPINE WITHOUT AND WITH CONTRAST TECHNIQUE: Multiplanar and multiecho pulse sequences of the cervical spine, to include the craniocervical junction and cervicothoracic junction, and thoracic and lumbar spine, were obtained without and with intravenous contrast. CONTRAST:  7mL GADAVIST GADOBUTROL 1 MMOL/ML IV SOLN COMPARISON:  None. FINDINGS: MRI CERVICAL SPINE FINDINGS Alignment: Normal Vertebrae: No fracture, evidence of discitis, or bone lesion. Cord: Normal Posterior Fossa, vertebral arteries, paraspinal tissues: Negative. Disc levels: There is no disc herniation. No spinal canal or neural foraminal stenosis. No abnormal contrast enhancement. MRI THORACIC SPINE FINDINGS Alignment:  Physiologic. Vertebrae: No fracture, evidence of discitis, or bone lesion. Cord:  Normal signal and morphology. Paraspinal and other soft tissues: Negative. Disc levels: No disc herniation, spinal canal stenosis or neural foraminal stenosis. No abnormal contrast enhancement. MRI LUMBAR SPINE FINDINGS Segmentation:  Standard Alignment:  Normal Vertebrae:  No fracture, evidence of discitis, or bone lesion. Conus medullaris and cauda equina: Conus extends to the L1 level. Conus and cauda equina appear normal. Paraspinal and other soft tissues: Negative Disc levels: All lumbar levels  are normal. There is no disc herniation, spinal canal stenosis or neural foraminal stenosis. No abnormal contrast enhancement. IMPRESSION: Normal MRI of the cervical, thoracic and lumbar spine. Electronically Signed   By: Deatra RobinsonKevin  Herman M.D.   On: 07/29/2020 00:41   DG Chest Port 1 View  Result Date: 07/28/2020 CLINICAL DATA:  Cough, weakness EXAM: PORTABLE CHEST 1 VIEW COMPARISON:  11/29/2017 FINDINGS: Lungs are clear.  No pleural effusion or pneumothorax. The heart is normal in size. IMPRESSION: No evidence of acute cardiopulmonary disease. Electronically Signed   By: Charline BillsSriyesh  Krishnan M.D.   On: 07/28/2020 13:30   US CHEST SOFT TISSUE  Result  Date: 08/14/2020 CLINICAL DATA:  Left armpit swelling EXAM: ULTRASOUND OF HEAD/NECK SOFT TISSUES TECHNIQUE: Ultrasound examination of the head and neck soft tissues was performed in the area of clinical concern. COMPARISON:  CT 11/29/2017 FINDINGS: Targeted sonography of the left axillary region is performed. In the region of swelling, there is a complex fluid collection measuring 3.3 x 0.8 x 1.9 cm. Adjacent enlarged lymph node measuring 1.3 cm with slight eccentric cortical thickening. IMPRESSION: 1. 3.3 cm complex fluid collection in the left axilla corresponding to swelling. This may reflect infected or inflammatory fluid collection. 2. Slightly enlarged left axillary lymph node, possibly reactive. Electronically Signed   By: Jasmine PangKim  Fujinaga M.D.   On: 08/14/2020 18:50        The results of significant diagnostics from this hospitalization (including imaging, microbiology, ancillary and laboratory) are listed below for reference.     Microbiology: Recent Results (from the past 240 hour(s))  Aerobic/Anaerobic Culture (surgical/deep wound)     Status: None (Preliminary result)   Collection Time: 08/14/20  4:08 PM   Specimen: Wound; Abscess  Result Value Ref Range Status   Specimen Description WOUND LEFT AXILLA  Final   Special Requests Immunocompromised  Final   Gram Stain   Final    ABUNDANT WBC PRESENT, PREDOMINANTLY PMN FEW GRAM POSITIVE COCCI Performed at [facility], [address] [place], [place] [id number]    Culture   Final    MODERATE STAPHYLOCOCCUS LUGDUNENSIS NO ANAEROBES ISOLATED; CULTURE IN PROGRESS FOR 5 DAYS    Report Status PENDING  Incomplete   Organism ID, Bacteria STAPHYLOCOCCUS LUGDUNENSIS  Final      Susceptibility   Staphylococcus lugdunensis - MIC*    CIPROFLOXACIN <=0.5 SENSITIVE Sensitive     ERYTHROMYCIN >=8 RESISTANT Resistant     GENTAMICIN <=0.5 SENSITIVE Sensitive     OXACILLIN 0.5 SENSITIVE Sensitive     TETRACYCLINE <=1 SENSITIVE Sensitive      VANCOMYCIN <=0.5 SENSITIVE Sensitive     TRIMETH/SULFA <=10 SENSITIVE Sensitive     CLINDAMYCIN >=8 RESISTANT Resistant     RIFAMPIN <=0.5 SENSITIVE Sensitive     Inducible Clindamycin NEGATIVE Sensitive     * MODERATE STAPHYLOCOCCUS LUGDUNENSIS     Labs: BNP (last 3 results) No results for input(s): BNP in the last 8760 hours. Basic Metabolic Panel: Recent Labs  Lab 08/11/20 0904 08/14/20 1124  NA 133* 136  K 4.2 3.5  CL 102 101  CO2 21* 25  GLUCOSE 98 113*  BUN 10 9  CREATININE 0.68 0.71  CALCIUM 9.3 9.6   Liver Function Tests: Recent Labs  Lab 08/14/20 1124  AST 37  ALT 50*  ALKPHOS 151*  BILITOT 0.4  PROT 9.9*  ALBUMIN 3.3*   No results for input(s): LIPASE, AMYLASE in the last 168 hours. No results for input(s): AMMONIA in  the last 168 hours. CBC: Recent Labs  Lab 08/11/20 0904 08/14/20 1124  WBC 7.2 8.4  NEUTROABS  --  3.1  HGB 11.9* 12.8*  HCT 34.9* 38.7*  MCV 105.8* 105.7*  PLT 322 358   Cardiac Enzymes: No results for input(s): CKTOTAL, CKMB, CKMBINDEX, TROPONINI in the last 168 hours. BNP: Invalid input(s): POCBNP CBG: No results for input(s): GLUCAP in the last 168 hours. D-Dimer No results for input(s): DDIMER in the last 72 hours. Hgb A1c No results for input(s): HGBA1C in the last 72 hours. Lipid Profile No results for input(s): CHOL, HDL, LDLCALC, TRIG, CHOLHDL, LDLDIRECT in the last 72 hours. Thyroid function studies No results for input(s): TSH, T4TOTAL, T3FREE, THYROIDAB in the last 72 hours.  Invalid input(s): FREET3 Anemia work up No results for input(s): VITAMINB12, FOLATE, FERRITIN, TIBC, IRON, RETICCTPCT in the last 72 hours. Urinalysis    Component Value Date/Time   COLORURINE AMBER (A) 07/28/2020 1303   APPEARANCEUR CLEAR 07/28/2020 1303   LABSPEC 1.012 07/28/2020 1303   PHURINE 6.0 07/28/2020 1303   GLUCOSEU NEGATIVE 07/28/2020 1303   HGBUR NEGATIVE 07/28/2020 1303   BILIRUBINUR SMALL (A) 07/28/2020 1303    KETONESUR NEGATIVE 07/28/2020 1303   PROTEINUR NEGATIVE 07/28/2020 1303   NITRITE NEGATIVE 07/28/2020 1303   LEUKOCYTESUR TRACE (A) 07/28/2020 1303   Sepsis Labs Invalid input(s): PROCALCITONIN,  WBC,  LACTICIDVEN   Time coordinating discharge: 40 minutes  SIGNED:  Almon Hercules, MD  [facility] 08/17/2020, 3:57 PM  If 7PM-7AM, please contact night-coverage [email or web address]

## 2020-08-17 NOTE — Progress Notes (Signed)
[facility]  Date of Admission:  07/28/2020     Total days of antibiotics 20         ASSESSMENT:  Travis Mcgee is feeling better s/p I&D of his left axilla abscess. Cultures positive for Staph Lugdunensis with sensitivities pending. Will continue current dose of vancomycin pending sensitivity results and can likely change to oral therapy. Continue wound care per General Surgery. Discussed importance of continuing to take Biktarvy and Bactrim. Will arrange routine follow up in ID clinic for AIDS. Continue Guillain Barre treatment per primary team.   PLAN:  1. Continue vancomycin pending sensitivity results of Staph Lugdunensis.  2. Wound care per General Surgery recommendations. 3. Continue Biktarvy and Bactrim.  4. Arrange follow up in ID office   Principal Problem:   Guillain Barr syndrome (HCC) Active Problems:   AIDS (acquired immune deficiency syndrome) (HCC)   History of syphilis   Lactic acidosis   Malnutrition of moderate degree   Axillary abscess   . bictegravir-emtricitabine-tenofovir AF  1 tablet Oral Daily  . DULoxetine  20 mg Oral Daily  . enoxaparin (LOVENOX) injection  40 mg Subcutaneous Q24H  . feeding supplement  237 mL Oral Q24H  . gabapentin  900 mg Oral TID  . levothyroxine  25 mcg Oral Q0600  . methocarbamol  500 mg Oral TID  . multivitamin with minerals  1 tablet Oral Daily  . sulfamethoxazole-trimethoprim  1 tablet Oral Daily  . thiamine injection  100 mg Intravenous Daily   Or  . thiamine  100 mg Oral Daily  . vitamin B-12  1,000 mcg Oral Daily    SUBJECTIVE:  Afebrile overnight with no acute events. Feeling better today. Has several questions about his viral load and CD4 count.   No Known Allergies   Review of Systems: Review of Systems  Constitutional: Negative for chills, fever and weight loss.  Respiratory: Negative for cough, shortness of breath and wheezing.   Cardiovascular: Negative for chest pain and leg  swelling.  Gastrointestinal: Negative for abdominal pain, constipation, diarrhea, nausea and vomiting.  Skin: Negative for rash.      OBJECTIVE: Vitals:   08/16/20 1606 08/16/20 1935 08/17/20 0305 08/17/20 0735  BP: 112/60 114/90 115/84 112/75  Pulse: (!) 102 94 (!) 57 96  Resp: 18 16 18 17   Temp: 98 F (36.7 C) 98.3 F (36.8 C) 98.1 F (36.7 C) 98.3 F (36.8 C)  TempSrc: Oral Oral Oral Oral  SpO2: 96% 98% 99% 98%  Weight:      Height:       Body mass index is 22.05 kg/m.  Physical Exam Constitutional:      General: He is not in acute distress.    Appearance: He is well-developed.  Cardiovascular:     Rate and Rhythm: Normal rate and regular rhythm.     Heart sounds: Normal heart sounds.  Pulmonary:     Effort: Pulmonary effort is normal.     Breath sounds: Normal breath sounds.  Skin:    General: Skin is warm and dry.  Neurological:     Mental Status: He is alert and oriented to person, place, and time.  Psychiatric:        Behavior: Behavior normal.        Thought Content: Thought content normal.        Judgment: Judgment normal.     Lab Results Lab Results  Component Value Date   WBC 8.4 08/14/2020   HGB  12.8 (L) 08/14/2020   HCT 38.7 (L) 08/14/2020   MCV 105.7 (H) 08/14/2020   PLT 358 08/14/2020    Lab Results  Component Value Date   CREATININE 0.71 08/14/2020   BUN 9 08/14/2020   NA 136 08/14/2020   K 3.5 08/14/2020   CL 101 08/14/2020   CO2 25 08/14/2020    Lab Results  Component Value Date   ALT 50 (H) 08/14/2020   AST 37 08/14/2020   ALKPHOS 151 (H) 08/14/2020   BILITOT 0.4 08/14/2020     Microbiology: Recent Results (from the past 240 hour(s))  Aerobic/Anaerobic Culture (surgical/deep wound)     Status: None (Preliminary result)   Collection Time: 08/14/20  4:08 PM   Specimen: Wound; Abscess  Result Value Ref Range Status   Specimen Description WOUND LEFT AXILLA  Final   Special Requests Immunocompromised  Final   Gram Stain    Final    ABUNDANT WBC PRESENT, PREDOMINANTLY PMN FEW GRAM POSITIVE COCCI    Culture   Final    MODERATE STAPHYLOCOCCUS LUGDUNENSIS NO ANAEROBES ISOLATED; CULTURE IN PROGRESS FOR 5 DAYS SUSCEPTIBILITIES TO FOLLOW Performed at [facility], [address] [place], [place] [address]    Report Status PENDING  Incomplete     Marcos Eke, NP [facility]  08/17/2020  11:53 AM

## 2020-08-17 NOTE — Discharge Instructions (Signed)
WOUND CARE OF LEFT AXILLA -- remove packing strip in 48 hours (Saturday 10/23). After that shower daily with the wound open using mild soap and water. Cover area with dry gauze and tape until it closes. Call our office as needed for new cellulitis (redness) of the area, fever, or recurrence of fluctuance/swelling.

## 2020-08-18 ENCOUNTER — Encounter (HOSPITAL_COMMUNITY): Payer: Self-pay | Admitting: Emergency Medicine

## 2020-08-18 ENCOUNTER — Emergency Department (HOSPITAL_COMMUNITY): Payer: No Typology Code available for payment source

## 2020-08-18 ENCOUNTER — Emergency Department (HOSPITAL_COMMUNITY)
Admission: EM | Admit: 2020-08-18 | Discharge: 2020-08-20 | Disposition: A | Discharge status: Home / Self Care | Payer: No Typology Code available for payment source | Attending: Emergency Medicine | Admitting: Emergency Medicine

## 2020-08-18 DIAGNOSIS — M6281 Muscle weakness (generalized): Secondary | ICD-10-CM | POA: Insufficient documentation

## 2020-08-18 DIAGNOSIS — R29898 Other symptoms and signs involving the musculoskeletal system: Secondary | ICD-10-CM

## 2020-08-18 DIAGNOSIS — S82831A Other fracture of upper and lower end of right fibula, initial encounter for closed fracture: Secondary | ICD-10-CM

## 2020-08-18 DIAGNOSIS — Z20822 Contact with and (suspected) exposure to covid-19: Secondary | ICD-10-CM | POA: Insufficient documentation

## 2020-08-18 DIAGNOSIS — R202 Paresthesia of skin: Secondary | ICD-10-CM | POA: Insufficient documentation

## 2020-08-18 DIAGNOSIS — F17210 Nicotine dependence, cigarettes, uncomplicated: Secondary | ICD-10-CM | POA: Insufficient documentation

## 2020-08-18 DIAGNOSIS — Z21 Asymptomatic human immunodeficiency virus [HIV] infection status: Secondary | ICD-10-CM | POA: Insufficient documentation

## 2020-08-18 IMAGING — DX DG ANKLE COMPLETE 3+V*R*
3 series · 3 of 3 positions shown · non-contrast
Comparison: None.

CLINICAL DATA: Recent fall with right ankle pain, initial encounter

EXAM:
RIGHT ANKLE - COMPLETE 3+ VIEW

[x ankle ap right]
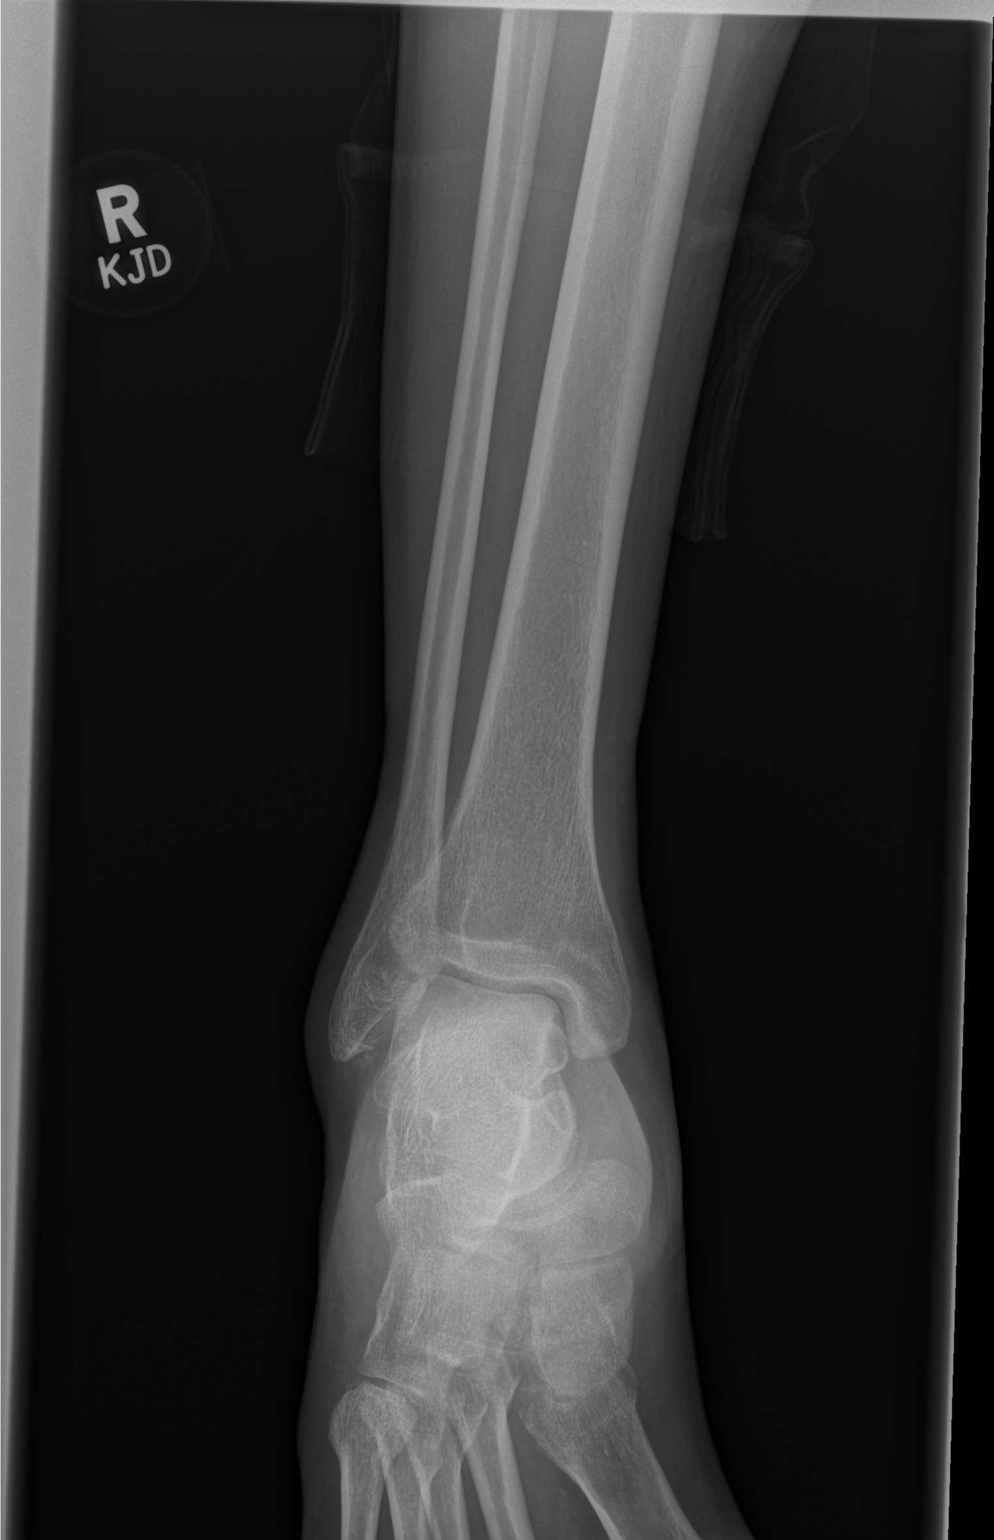

[x ankle obl right]
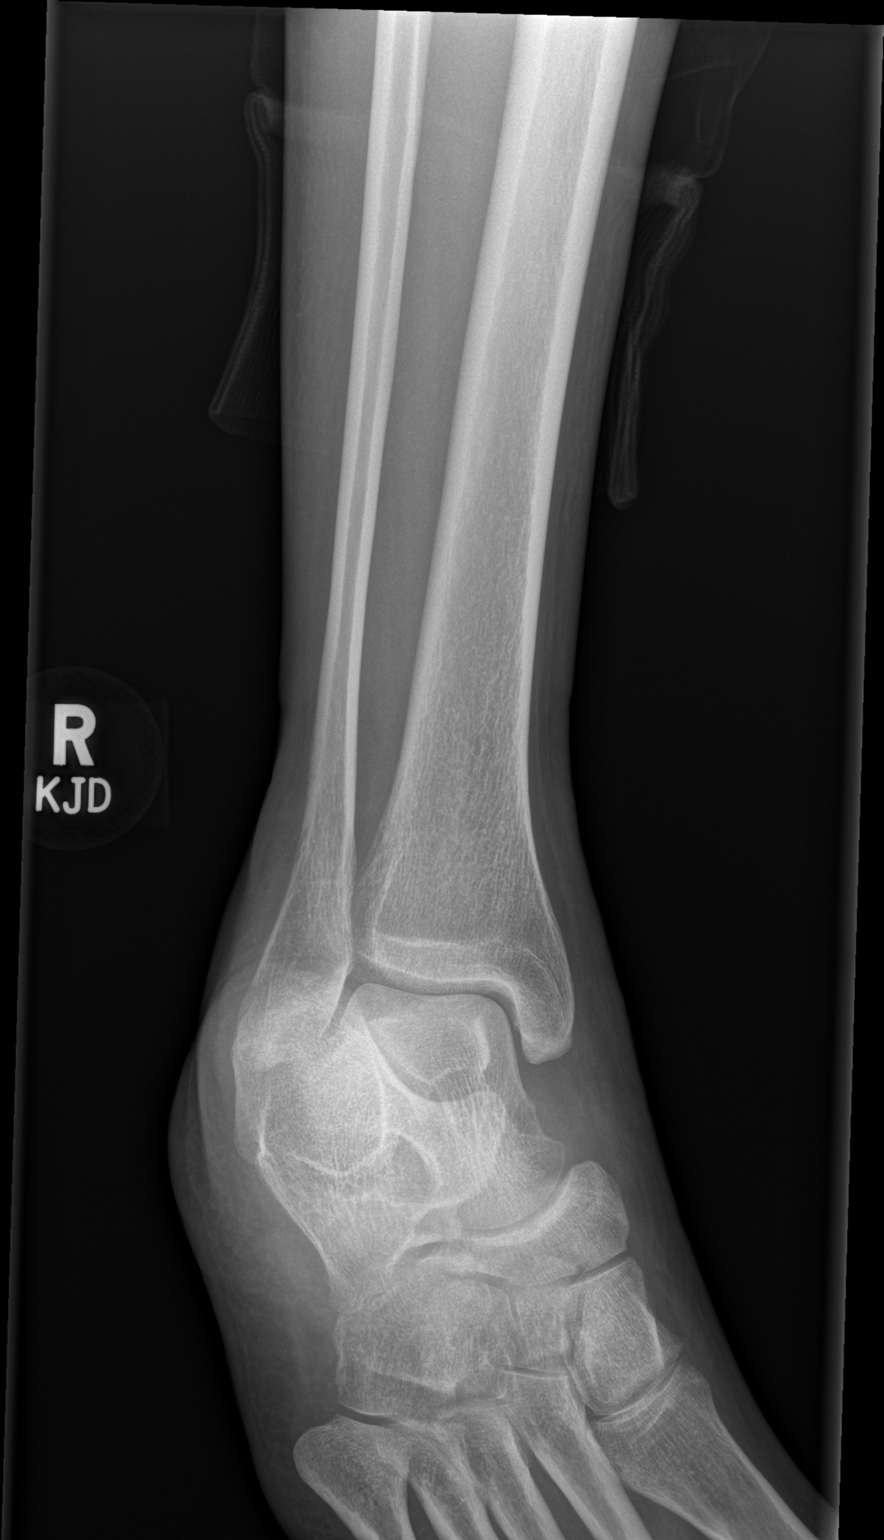

[x ankle lat right]
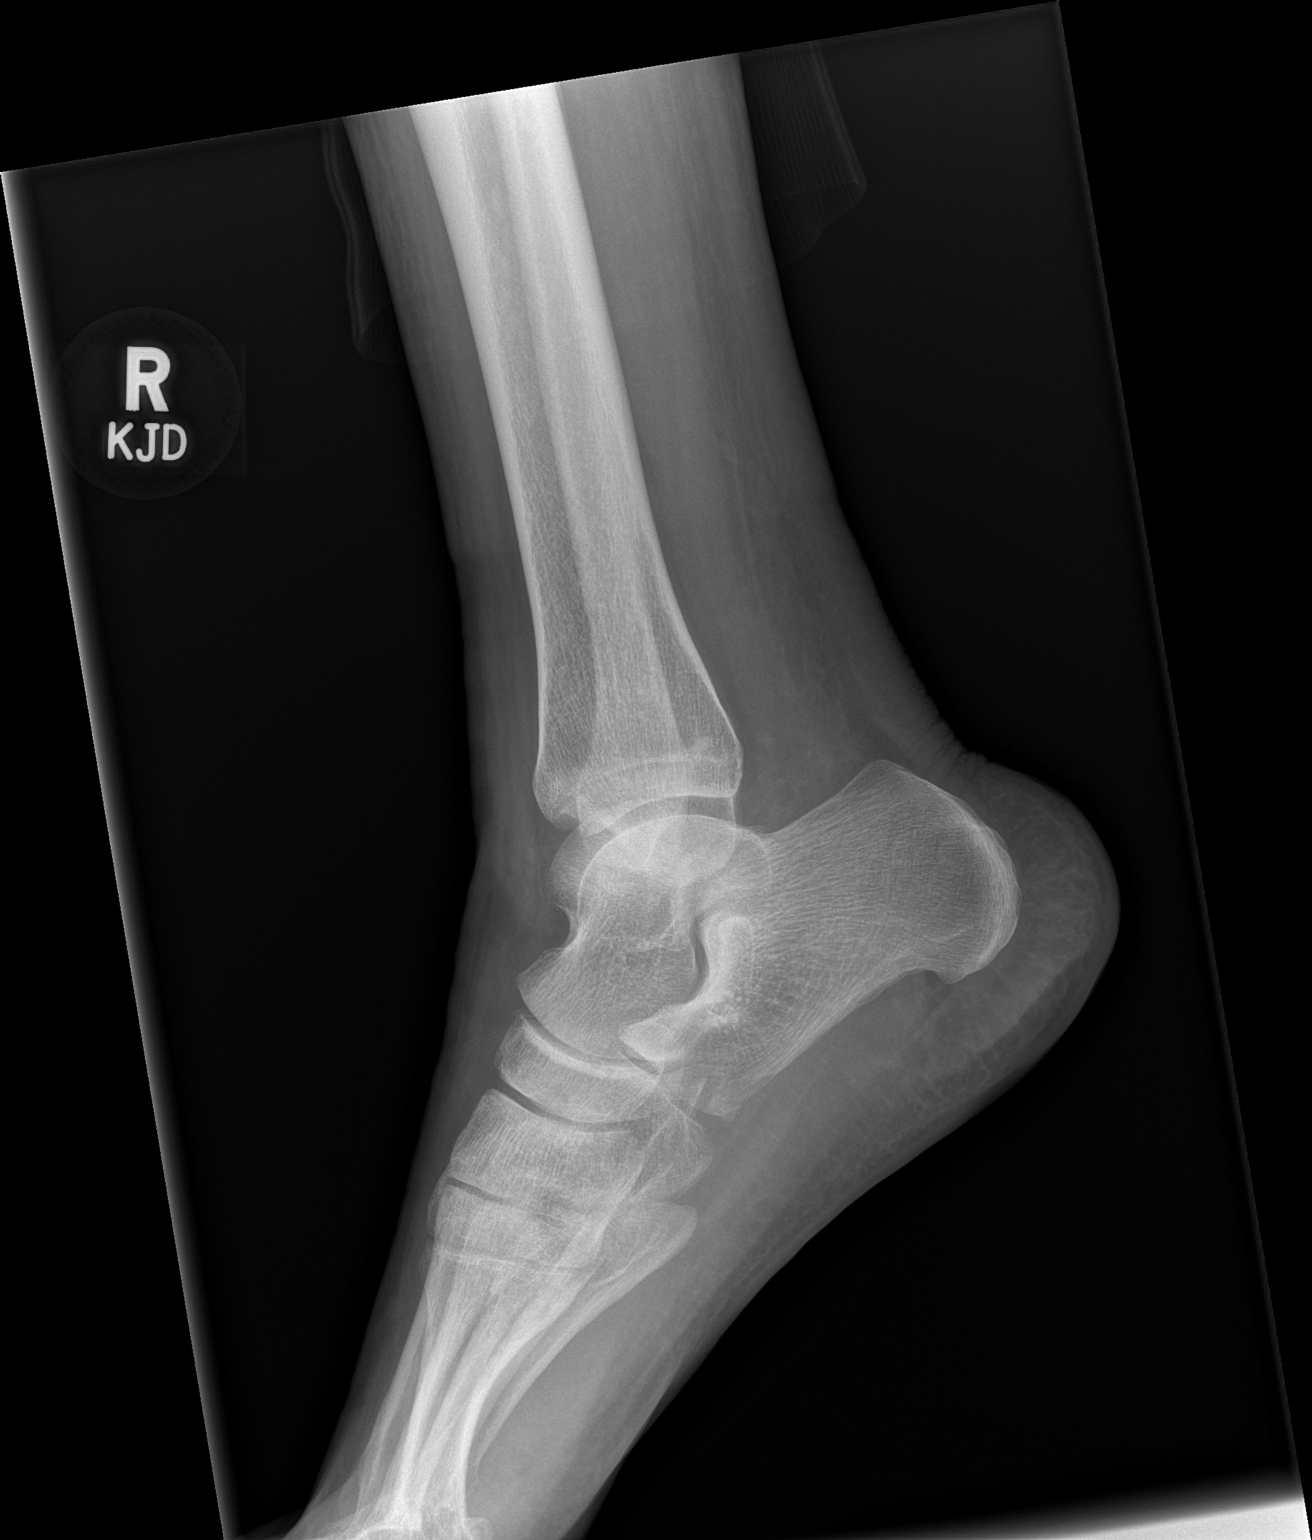

[3 of 3 positions shown; findings below may reference images not displayed]

FINDINGS: Avulsion fracture from the tip of the distal fibula is noted.
Minimal soft tissue swelling is noted. No other focal abnormality is
seen.
IMPRESSION: Avulsion from the tip of the distal fibula.
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
## 2020-08-18 NOTE — ED Triage Notes (Signed)
Pt transported from [facility] by [facility], pt reports he was d/c today, multiple falls today c/o lower extremity weakness. Pt c/o R ankle pain.

## 2020-08-18 NOTE — Progress Notes (Signed)
Discharged to home with family office visits in place teaching done  

## 2020-08-18 NOTE — ED Notes (Signed)
Pt states he does not feel like he was stable enough to leave. Pt does have cane with him

## 2020-08-18 NOTE — Discharge Summary (Addendum)
Physician Discharge Summary  Travis Mcgee ZOX:[id number] DOB: 04-13-1981 DOA: 07/28/2020  PCP: Patient, No Pcp Per  Admit date: 07/28/2020 Discharge date: 08/18/2020  Admitted From: Home Disposition:  Shed on friend's property  Recommendations for Outpatient Follow-up:  1. Follow up with PCP in 1 week 2. Follow up with [facility] [phone]) if you develop concerns w / left arm pit 3. Please follow up on the following pending results: acid fast smear  Home Health: None  Equipment/Devices: Cane   Discharge Condition: Stable  CODE STATUS: FULL   Brief/Interim Summary: 39 year old male with PMH of HIV/AIDS and latent syphilis presenting with progressive BLE pain, paresthesia, weakness and recurrent falls, and admitted with working diagnosis of [name] syndrome.  CTA head, MRI brain, MRI cervical, thoracic and lumbar spine without significant finding. Neurology consulted. Underwent lumbar puncture. CSF fluid culture, cryptococcus antigen and VDRL negative. He had elevated CSF IgG/albumin ratio consistent with intrathecal antibody production. He received IVIG for 5 days. However, continued to have BLE weakness and painful paresthesias. There was also concern about possible neurosyphilis and started on IV penicillin that he completed on 08/12/2020. He has elevated Borrelia antibodies, IgM/IgG but no recent tick exposure. Western blot was negative as well. Initially, therapy recommended SNF, but clinically improved, and he only needed a cane on discharge.  Patient has had a new finding of left axillary abscess on 10/19. General surgery consulted and performed needle aspiration of 2 cc purulent pus.Started on IV Unasyn on 10/20. ID reconsulted, andrecommended I&D to evacuate abscess, continuing IV Unasynand sent labs forHIV RNA, CD4andAFB blood cultures.  Wound culturegrewCulture showed Staphylococcus Lugdunensis.Patient underwent I&D on  10/22.Antibiotics changed to IV vancomycin and deescalated to Augmentin for 7 more days on discharge per ID recommendation.  Wound care instruction provided by surgery.  Follow-up with surgery as needed. ID to arrange outpatient follow-up.  10/23: Patient is stable for discharge. All of his medications have been delivered to his room. He will discharge to home today.   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      - S/p IVIG for 5 days and IV penicillin for 10 days.        - Significant improvement in his ambulation but continues to endorse pain and paresthesia.     - Continue duloxetine, gabapentin, tramadol and thiamine.     - 10/23: He was stable for discharge yesterday but did not go d/t problem with living arrangement. He is medically stable for discharge today. Will send home.  Recurrent falls     - Likely due to the above.     - Now he ambulates independently.     - 10/23: He is walking the halls fine. Seen as nursing desk asking for bus pass and taxi voucher  Left axillary abscess     - Needle aspiration on 10/19; Abscess culture grew staphylococcus Lugdunensis.      - I&D with wound packing on 10/21.     - IV Unasyn 10/20-10/21>>vancomycin 10/21-10/22>> Augmentin for 7 more days     - Wound care instruction by surgery as below     - remove packing strip in 48 hours (Saturday 10/23). After that shower daily with the wound open using mild soap and water. Cover area with dry gauze and tape until it closes. Call our surgery office as needed for new cellulitis (redness) of the area, fever, or recurrence of fluctuance/swelling.  HIV/ AIDS:      - Absolute CD4 197.  HIV viral load 450.  Improved compared with his previous labs.     - On Biktarvy and prophylactic dose Bactrim DS for PJP and toxo     - Outpatient follow-up with ID  Anemia of chronic disease/macrocytosis     - H&H stable. B12 and  folic acid within normal     - Recheck CBC at follow-up.  Hypothyroidism     - Slightly elevated TSH with low free T4.     - Continue Synthroid 25 mcg daily     - Recheck TFT in 3 to 4 weeks  Hyponatremia Hypokalemia     - stable, resolved  Debility/physical deconditioning     - Ambulating independently; follow up with PCP  COVID-19 vaccination     - Received the first dose of Pfizer vaccine on 10/21     - Second dose in 3 to 4 weeks  Moderate protein calorie malnutrition:     - Multivitamins     Body mass index is 22.05 kg/m.     Nutrition Problem: Moderate Malnutrition     Etiology: chronic illness (HIV)     Signs/Symptoms: mild fat depletion, severe muscle depletion     Interventions: Ensure Enlive (each supplement provides 350kcal and 20 grams of protein), MVI   Discharge Instructions  Discharge Instructions    Call MD for:  extreme fatigue   Complete by: As directed    Call MD for:  persistant dizziness or light-headedness   Complete by: As directed    Call MD for:  redness, tenderness, or signs of infection (pain, swelling, redness, odor or green/yellow discharge around incision site)   Complete by: As directed    Call MD for:  severe uncontrolled pain   Complete by: As directed    Call MD for:  temperature >100.4   Complete by: As directed    Diet general   Complete by: As directed    Discharge instructions   Complete by: As directed    It has been a pleasure taking care of you!  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  Your symptoms improved.  We are discharging you on antibiotics for left axillary abscess to complete treatment course.  Follow the instruction by surgery about wound care.  You may follow-up with surgery if concern with your wound or delayed healing.  Also follow-up with infectious disease doctors.  Place review your medication list and the directions on your medications  before you take them.   Take care,   For home use only DME Cane   Complete by: As directed    Increase activity slowly   Complete by: As directed      Allergies as of 08/18/2020   No Known Allergies     Medication List    STOP taking these medications   azithromycin 600 MG tablet Commonly known as: ZITHROMAX   fluconazole 200 MG tablet Commonly known as: DIFLUCAN   mirtazapine 15 MG tablet Commonly known as: REMERON     TAKE these medications   acetaminophen 500 MG tablet Commonly known as: TYLENOL Take 1 tablet (500 mg total) by mouth every 8 (eight) hours for 10 days.   amoxicillin-clavulanate 875-125 MG tablet Commonly known as: AUGMENTIN Take 1 tablet by mouth every 12 (twelve) hours.   Biktarvy 50-200-25 MG Tabs tablet Generic drug: bictegravir-emtricitabine-tenofovir AF Take 1 tablet by mouth daily.  cyanocobalamin 1000 MCG tablet Take 1 tablet (1,000 mcg total) by mouth daily.   DULoxetine 20 MG capsule Commonly known as: CYMBALTA Take 1 capsule (20 mg total) by mouth daily.   gabapentin 300 MG capsule Commonly known as: NEURONTIN Take 3 capsules (900 mg total) by mouth 3 (three) times daily.   levothyroxine 25 MCG tablet Commonly known as: SYNTHROID Take 1 tablet (25 mcg total) by mouth daily at 6 (six) AM.   multivitamin with minerals Tabs tablet Take 1 tablet by mouth daily.   sulfamethoxazole-trimethoprim 800-160 MG tablet Commonly known as: BACTRIM DS Take 1 tablet by mouth 3 (three) times a week.   traMADol 50 MG tablet Commonly known as: ULTRAM Take 1 tablet (50 mg total) by mouth every 6 (six) hours as needed for up to 7 days for severe pain.            Durable Medical Equipment  (From admission, onward)         Start     Ordered   08/17/20 1603  For home use only DME Cane  Once        08/17/20 1602   08/17/20 0000  For home use only DME Cane        08/17/20 1555          Follow-up Information    [facility] Follow up.   Specialty: General Surgery Why: No follow up required, call our office as needed if you develop concerns regarding your left axilla (arm pit). Contact information: [address] [place] [address] [phone]        PCP Follow up.   Why: Follow up with your PCP in 1 week.             No Known Allergies  Consultations:  General surgery  ID  Neurology  Procedures/Studies: CT Head Wo Contrast  Result Date: 07/28/2020 CLINICAL DATA:  Lower extremity pain EXAM: CT HEAD WITHOUT CONTRAST TECHNIQUE: Contiguous axial images were obtained from the base of the skull through the vertex without intravenous contrast. COMPARISON:  None. FINDINGS: Brain: No evidence of acute infarction, hemorrhage, hydrocephalus, extra-axial collection or mass lesion/mass effect. Vascular: No hyperdense vessel or unexpected calcification. Skull: Normal. Negative for fracture or focal lesion. Sinuses/Orbits: The visualized paranasal sinuses are essentially clear. The mastoid air cells are unopacified. Other: None. IMPRESSION: Normal head CT. Electronically Signed   By: Charline Bills M.D.   On: 07/28/2020 18:46   MR BRAIN WO CONTRAST  Result Date: 07/28/2020 CLINICAL DATA:  Motor neuron disease.  Lower extremity pain. EXAM: MRI HEAD WITHOUT CONTRAST TECHNIQUE: Multiplanar, multiecho pulse sequences of the brain and surrounding structures were obtained without intravenous contrast. COMPARISON:  None. FINDINGS: BRAIN: No acute infarct, acute hemorrhage or extra-axial collection. Normal white matter signal. Normal volume of CSF spaces. No chronic microhemorrhage. Normal midline structures. VASCULAR: Major flow voids are preserved. SKULL AND UPPER CERVICAL SPINE: Normal calvarium and skull base. Visualized upper cervical spine and soft tissues are normal. SINUSES/ORBITS: No paranasal sinus fluid levels or advanced mucosal thickening. No mastoid or middle ear effusion. Normal  orbits. IMPRESSION: Normal brain MRI. Electronically Signed   By: Deatra Robinson M.D.   On: 07/28/2020 23:03   MR CERVICAL SPINE W WO CONTRAST  Result Date: 07/29/2020 CLINICAL DATA:  Lower extremity pain bilaterally.  HIV/AIDS EXAM: MRI CERVICAL, THORACIC AND LUMBAR SPINE WITHOUT AND WITH CONTRAST TECHNIQUE: Multiplanar and multiecho pulse sequences of the cervical spine, to include  the craniocervical junction and cervicothoracic junction, and thoracic and lumbar spine, were obtained without and with intravenous contrast. CONTRAST:  47mL GADAVIST GADOBUTROL 1 MMOL/ML IV SOLN COMPARISON:  None. FINDINGS: MRI CERVICAL SPINE FINDINGS Alignment: Normal Vertebrae: No fracture, evidence of discitis, or bone lesion. Cord: Normal Posterior Fossa, vertebral arteries, paraspinal tissues: Negative. Disc levels: There is no disc herniation. No spinal canal or neural foraminal stenosis. No abnormal contrast enhancement. MRI THORACIC SPINE FINDINGS Alignment:  Physiologic. Vertebrae: No fracture, evidence of discitis, or bone lesion. Cord:  Normal signal and morphology. Paraspinal and other soft tissues: Negative. Disc levels: No disc herniation, spinal canal stenosis or neural foraminal stenosis. No abnormal contrast enhancement. MRI LUMBAR SPINE FINDINGS Segmentation:  Standard Alignment:  Normal Vertebrae:  No fracture, evidence of discitis, or bone lesion. Conus medullaris and cauda equina: Conus extends to the L1 level. Conus and cauda equina appear normal. Paraspinal and other soft tissues: Negative Disc levels: All lumbar levels are normal. There is no disc herniation, spinal canal stenosis or neural foraminal stenosis. No abnormal contrast enhancement. IMPRESSION: Normal MRI of the cervical, thoracic and lumbar spine. Electronically Signed   By: Deatra Robinson M.D.   On: 07/29/2020 00:41   MR THORACIC SPINE W WO CONTRAST  Result Date: 07/29/2020 CLINICAL DATA:  Lower extremity pain bilaterally.  HIV/AIDS EXAM: MRI  CERVICAL, THORACIC AND LUMBAR SPINE WITHOUT AND WITH CONTRAST TECHNIQUE: Multiplanar and multiecho pulse sequences of the cervical spine, to include the craniocervical junction and cervicothoracic junction, and thoracic and lumbar spine, were obtained without and with intravenous contrast. CONTRAST:  63mL GADAVIST GADOBUTROL 1 MMOL/ML IV SOLN COMPARISON:  None. FINDINGS: MRI CERVICAL SPINE FINDINGS Alignment: Normal Vertebrae: No fracture, evidence of discitis, or bone lesion. Cord: Normal Posterior Fossa, vertebral arteries, paraspinal tissues: Negative. Disc levels: There is no disc herniation. No spinal canal or neural foraminal stenosis. No abnormal contrast enhancement. MRI THORACIC SPINE FINDINGS Alignment:  Physiologic. Vertebrae: No fracture, evidence of discitis, or bone lesion. Cord:  Normal signal and morphology. Paraspinal and other soft tissues: Negative. Disc levels: No disc herniation, spinal canal stenosis or neural foraminal stenosis. No abnormal contrast enhancement. MRI LUMBAR SPINE FINDINGS Segmentation:  Standard Alignment:  Normal Vertebrae:  No fracture, evidence of discitis, or bone lesion. Conus medullaris and cauda equina: Conus extends to the L1 level. Conus and cauda equina appear normal. Paraspinal and other soft tissues: Negative Disc levels: All lumbar levels are normal. There is no disc herniation, spinal canal stenosis or neural foraminal stenosis. No abnormal contrast enhancement. IMPRESSION: Normal MRI of the cervical, thoracic and lumbar spine. Electronically Signed   By: Deatra Robinson M.D.   On: 07/29/2020 00:41   MR Lumbar Spine W Wo Contrast  Result Date: 07/29/2020 CLINICAL DATA:  Lower extremity pain bilaterally.  HIV/AIDS EXAM: MRI CERVICAL, THORACIC AND LUMBAR SPINE WITHOUT AND WITH CONTRAST TECHNIQUE: Multiplanar and multiecho pulse sequences of the cervical spine, to include the craniocervical junction and cervicothoracic junction, and thoracic and lumbar spine, were  obtained without and with intravenous contrast. CONTRAST:  22mL GADAVIST GADOBUTROL 1 MMOL/ML IV SOLN COMPARISON:  None. FINDINGS: MRI CERVICAL SPINE FINDINGS Alignment: Normal Vertebrae: No fracture, evidence of discitis, or bone lesion. Cord: Normal Posterior Fossa, vertebral arteries, paraspinal tissues: Negative. Disc levels: There is no disc herniation. No spinal canal or neural foraminal stenosis. No abnormal contrast enhancement. MRI THORACIC SPINE FINDINGS Alignment:  Physiologic. Vertebrae: No fracture, evidence of discitis, or bone lesion. Cord:  Normal signal and morphology. Paraspinal  and other soft tissues: Negative. Disc levels: No disc herniation, spinal canal stenosis or neural foraminal stenosis. No abnormal contrast enhancement. MRI LUMBAR SPINE FINDINGS Segmentation:  Standard Alignment:  Normal Vertebrae:  No fracture, evidence of discitis, or bone lesion. Conus medullaris and cauda equina: Conus extends to the L1 level. Conus and cauda equina appear normal. Paraspinal and other soft tissues: Negative Disc levels: All lumbar levels are normal. There is no disc herniation, spinal canal stenosis or neural foraminal stenosis. No abnormal contrast enhancement. IMPRESSION: Normal MRI of the cervical, thoracic and lumbar spine. Electronically Signed   By: Deatra Robinson M.D.   On: 07/29/2020 00:41   DG Chest Port 1 View  Result Date: 07/28/2020 CLINICAL DATA:  Cough, weakness EXAM: PORTABLE CHEST 1 VIEW COMPARISON:  11/29/2017 FINDINGS: Lungs are clear.  No pleural effusion or pneumothorax. The heart is normal in size. IMPRESSION: No evidence of acute cardiopulmonary disease. Electronically Signed   By: Charline Bills M.D.   On: 07/28/2020 13:30   [place] CHEST SOFT TISSUE  Result Date: 08/14/2020 CLINICAL DATA:  Left armpit swelling EXAM: ULTRASOUND OF HEAD/NECK SOFT TISSUES TECHNIQUE: Ultrasound examination of the head and neck soft tissues was performed in the area of clinical concern.  COMPARISON:  CT 11/29/2017 FINDINGS: Targeted sonography of the left axillary region is performed. In the region of swelling, there is a complex fluid collection measuring 3.3 x 0.8 x 1.9 cm. Adjacent enlarged lymph node measuring 1.3 cm with slight eccentric cortical thickening. IMPRESSION: 1. 3.3 cm complex fluid collection in the left axilla corresponding to swelling. This may reflect infected or inflammatory fluid collection. 2. Slightly enlarged left axillary lymph node, possibly reactive. Electronically Signed   By: Jasmine Pang M.D.   On: 08/14/2020 18:50      Subjective: No acute events ON.   Discharge Exam: Vitals:   08/17/20 2349 08/18/20 0743  BP: 116/71 108/74  Pulse: 96 87  Resp: 17   Temp: 98.3 F (36.8 C) 98 F (36.7 C)  SpO2: 100% 100%   Vitals:   08/17/20 1612 08/17/20 1930 08/17/20 2349 08/18/20 0743  BP: 123/80 113/79 116/71 108/74  Pulse: (!) 58 70 96 87  Resp: 16 18 17    Temp: 98.5 F (36.9 C) 98.6 F (37 C) 98.3 F (36.8 C) 98 F (36.7 C)  TempSrc: Oral Oral Oral Oral  SpO2: 95% 98% 100% 100%  Weight:      Height:        General: 39 y.o. male resting in bed in NAD Eyes: PERRL, normal sclera ENMT: Nares patent w/o discharge, orophaynx clear, dentition normal, ears w/o discharge/lesions/ulcers Neck: Supple, trachea midline Cardiovascular: RRR, +S1, S2, no m/g/r, equal pulses throughout Respiratory: CTABL, no w/r/r, normal WOB GI: BS+, NDNT, no masses noted, no organomegaly noted MSK: No e/c/c, left axilla bandaging CDI Skin: No rashes, bruises, ulcerations noted Neuro: A&O x 3, no focal deficits Psyc: Appropriate interaction and affect, calm/cooperative     The results of significant diagnostics from this hospitalization (including imaging, microbiology, ancillary and laboratory) are listed below for reference.     Microbiology: Recent Results (from the past 240 hour(s))  Aerobic/Anaerobic Culture (surgical/deep wound)     Status: None  (Preliminary result)   Collection Time: 08/14/20  4:08 PM   Specimen: Wound; Abscess  Result Value Ref Range Status   Specimen Description WOUND LEFT AXILLA  Final   Special Requests Immunocompromised  Final   Gram Stain   Final    ABUNDANT  WBC PRESENT, PREDOMINANTLY PMN FEW GRAM POSITIVE COCCI Performed at [facility], [address] [place], [place] [address]    Culture   Final    MODERATE STAPHYLOCOCCUS LUGDUNENSIS NO ANAEROBES ISOLATED; CULTURE IN PROGRESS FOR 5 DAYS    Report Status PENDING  Incomplete   Organism ID, Bacteria STAPHYLOCOCCUS LUGDUNENSIS  Final      Susceptibility   Staphylococcus lugdunensis - MIC*    CIPROFLOXACIN <=0.5 SENSITIVE Sensitive     ERYTHROMYCIN >=8 RESISTANT Resistant     GENTAMICIN <=0.5 SENSITIVE Sensitive     OXACILLIN 0.5 SENSITIVE Sensitive     TETRACYCLINE <=1 SENSITIVE Sensitive     VANCOMYCIN <=0.5 SENSITIVE Sensitive     TRIMETH/SULFA <=10 SENSITIVE Sensitive     CLINDAMYCIN >=8 RESISTANT Resistant     RIFAMPIN <=0.5 SENSITIVE Sensitive     Inducible Clindamycin NEGATIVE Sensitive     * MODERATE STAPHYLOCOCCUS LUGDUNENSIS     Labs: BNP (last 3 results) No results for input(s): BNP in the last 8760 hours. Basic Metabolic Panel: Recent Labs  Lab 08/14/20 1124  NA 136  K 3.5  CL 101  CO2 25  GLUCOSE 113*  BUN 9  CREATININE 0.71  CALCIUM 9.6   Liver Function Tests: Recent Labs  Lab 08/14/20 1124  AST 37  ALT 50*  ALKPHOS 151*  BILITOT 0.4  PROT 9.9*  ALBUMIN 3.3*   No results for input(s): LIPASE, AMYLASE in the last 168 hours. No results for input(s): AMMONIA in the last 168 hours. CBC: Recent Labs  Lab 08/14/20 1124  WBC 8.4  NEUTROABS 3.1  HGB 12.8*  HCT 38.7*  MCV 105.7*  PLT 358   Cardiac Enzymes: No results for input(s): CKTOTAL, CKMB, CKMBINDEX, TROPONINI in the last 168 hours. BNP: Invalid input(s): POCBNP CBG: No results for input(s): GLUCAP in the last 168 hours. D-Dimer No results  for input(s): DDIMER in the last 72 hours. Hgb A1c No results for input(s): HGBA1C in the last 72 hours. Lipid Profile No results for input(s): CHOL, HDL, LDLCALC, TRIG, CHOLHDL, LDLDIRECT in the last 72 hours. Thyroid function studies No results for input(s): TSH, T4TOTAL, T3FREE, THYROIDAB in the last 72 hours.  Invalid input(s): FREET3 Anemia work up No results for input(s): VITAMINB12, FOLATE, FERRITIN, TIBC, IRON, RETICCTPCT in the last 72 hours. Urinalysis    Component Value Date/Time   COLORURINE AMBER (A) 07/28/2020 1303   APPEARANCEUR CLEAR 07/28/2020 1303   LABSPEC 1.012 07/28/2020 1303   PHURINE 6.0 07/28/2020 1303   GLUCOSEU NEGATIVE 07/28/2020 1303   HGBUR NEGATIVE 07/28/2020 1303   BILIRUBINUR SMALL (A) 07/28/2020 1303   KETONESUR NEGATIVE 07/28/2020 1303   PROTEINUR NEGATIVE 07/28/2020 1303   NITRITE NEGATIVE 07/28/2020 1303   LEUKOCYTESUR TRACE (A) 07/28/2020 1303   Sepsis Labs Invalid input(s): PROCALCITONIN,  WBC,  LACTICIDVEN Microbiology Recent Results (from the past 240 hour(s))  Aerobic/Anaerobic Culture (surgical/deep wound)     Status: None (Preliminary result)   Collection Time: 08/14/20  4:08 PM   Specimen: Wound; Abscess  Result Value Ref Range Status   Specimen Description WOUND LEFT AXILLA  Final   Special Requests Immunocompromised  Final   Gram Stain   Final    ABUNDANT WBC PRESENT, PREDOMINANTLY PMN FEW GRAM POSITIVE COCCI Performed at [facility], [address] [place], [place] [address]    Culture   Final    MODERATE STAPHYLOCOCCUS LUGDUNENSIS NO ANAEROBES ISOLATED; CULTURE IN PROGRESS FOR 5 DAYS    Report  Status PENDING  Incomplete   Organism ID, Bacteria STAPHYLOCOCCUS LUGDUNENSIS  Final      Susceptibility   Staphylococcus lugdunensis - MIC*    CIPROFLOXACIN <=0.5 SENSITIVE Sensitive     ERYTHROMYCIN >=8 RESISTANT Resistant     GENTAMICIN <=0.5 SENSITIVE Sensitive     OXACILLIN 0.5 SENSITIVE Sensitive     TETRACYCLINE  <=1 SENSITIVE Sensitive     VANCOMYCIN <=0.5 SENSITIVE Sensitive     TRIMETH/SULFA <=10 SENSITIVE Sensitive     CLINDAMYCIN >=8 RESISTANT Resistant     RIFAMPIN <=0.5 SENSITIVE Sensitive     Inducible Clindamycin NEGATIVE Sensitive     * MODERATE STAPHYLOCOCCUS LUGDUNENSIS     Time coordinating discharge: 40 minutes  SIGNED:   Teddy Spike, DO  [facility] 08/18/2020, 12:47 PM   If 7PM-7AM, please contact night-coverage [email or web address]

## 2020-08-19 ENCOUNTER — Emergency Department (HOSPITAL_COMMUNITY): Payer: No Typology Code available for payment source

## 2020-08-19 ENCOUNTER — Other Ambulatory Visit: Payer: Self-pay

## 2020-08-19 LAB — BASIC METABOLIC PANEL
Anion gap: 11 (ref 5–15)
BUN: 7 mg/dL (ref 6–20)
CO2: 24 mmol/L (ref 22–32)
Calcium: 9.9 mg/dL (ref 8.9–10.3)
Chloride: 95 mmol/L — ABNORMAL LOW (ref 98–111)
Creatinine, Ser: 0.85 mg/dL (ref 0.61–1.24)
GFR, Estimated: >60 mL/min (ref >60)
Glucose, Bld: 100 mg/dL — ABNORMAL HIGH (ref 70–99)
Potassium: 4 mmol/L (ref 3.5–5.1)
Sodium: 130 mmol/L — ABNORMAL LOW (ref 135–145)

## 2020-08-19 LAB — CBC WITH DIFFERENTIAL/PLATELET
Abs Immature Granulocytes: 0.03 10*3/uL (ref 0.00–0.07)
Basophils Absolute: 0.1 10*3/uL (ref 0.0–0.1)
Basophils Relative: 1 %
Eosinophils Absolute: 0.5 10*3/uL (ref 0.0–0.5)
Eosinophils Relative: 4 %
HCT: 37.2 % — ABNORMAL LOW (ref 39.0–52.0)
Hemoglobin: 12.1 g/dL — ABNORMAL LOW (ref 13.0–17.0)
Immature Granulocytes: 0 %
Lymphocytes Relative: 19 %
Lymphs Abs: 2.4 10*3/uL (ref 0.7–4.0)
MCH: 33.9 pg (ref 26.0–34.0)
MCHC: 32.5 g/dL (ref 30.0–36.0)
MCV: 104.2 fL — ABNORMAL HIGH (ref 80.0–100.0)
Monocytes Absolute: 1 10*3/uL (ref 0.1–1.0)
Monocytes Relative: 8 %
Neutro Abs: 8.4 10*3/uL — ABNORMAL HIGH (ref 1.7–7.7)
Neutrophils Relative %: 68 %
Platelets: 342 10*3/uL (ref 150–400)
RBC: 3.57 MIL/uL — ABNORMAL LOW (ref 4.22–5.81)
RDW: 13.6 % (ref 11.5–15.5)
WBC: 12.5 10*3/uL — ABNORMAL HIGH (ref 4.0–10.5)
nRBC: 0 % (ref 0.0–0.2)

## 2020-08-19 LAB — RESPIRATORY PANEL BY RT PCR (FLU A&B, COVID)
Influenza A by PCR: NEGATIVE
Influenza B by PCR: NEGATIVE
SARS Coronavirus 2 by RT PCR: NEGATIVE

## 2020-08-19 LAB — AEROBIC/ANAEROBIC CULTURE W GRAM STAIN (SURGICAL/DEEP WOUND)

## 2020-08-19 IMAGING — DX DG FOOT COMPLETE 3+V*R*
3 series · 3 of 3 positions shown · non-contrast
Comparison: None.

CLINICAL DATA: Status post fall.

EXAM:
RIGHT FOOT COMPLETE - 3+ VIEW

[foot ap]
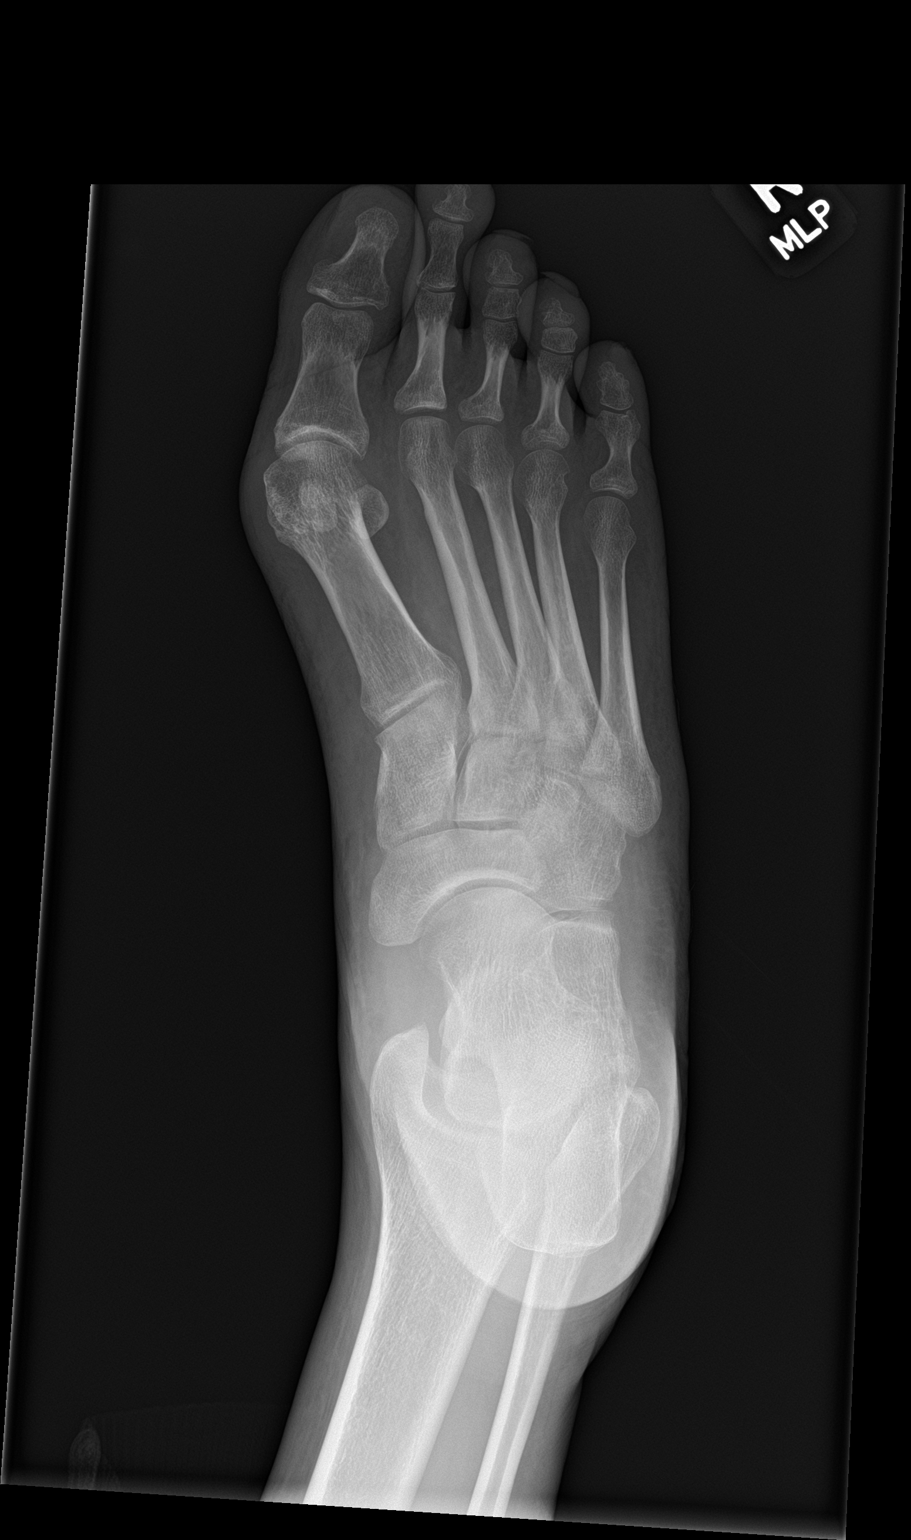

[foot obl]
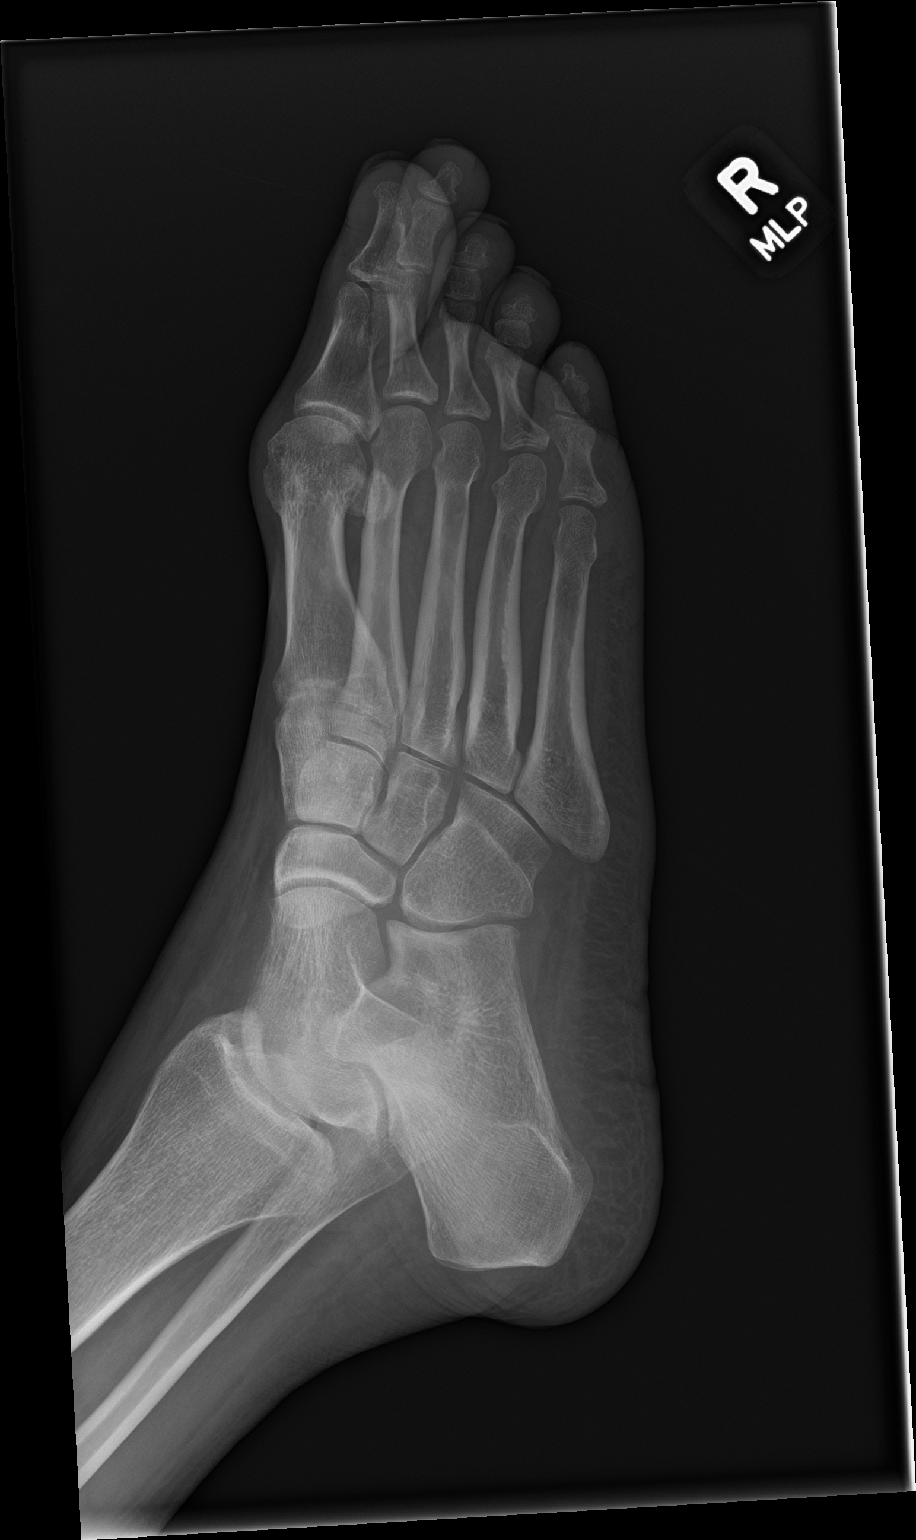

[foot lat]
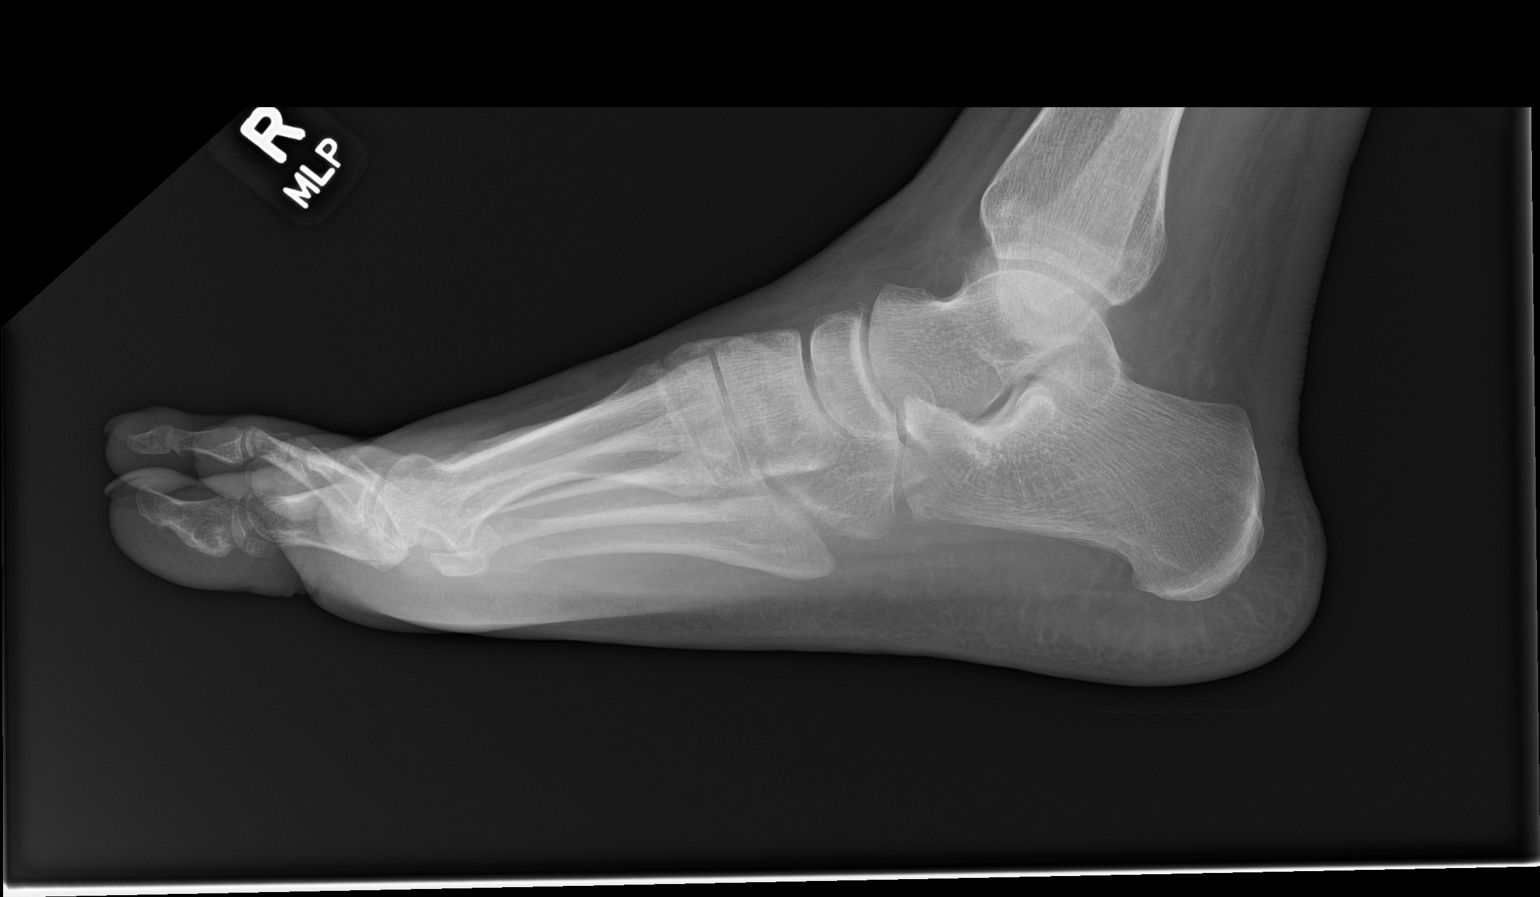

[3 of 3 positions shown; findings below may reference images not displayed]

FINDINGS: There is no evidence of an acute fracture or dislocation. Mild to
moderate severity degenerative changes are seen involving the
metatarsophalangeal articulation of the right great toe. Soft
tissues are unremarkable.
IMPRESSION: 1. No acute fracture or dislocation.

## 2020-08-19 MED ORDER — AMOXICILLIN-POT CLAVULANATE 875-125 MG PO TABS
1.0000 | ORAL_TABLET | Freq: Two times a day (BID) | ORAL | Status: DC
  Administered 2020-08-19 – 2020-08-20 (×4): 1 via ORAL
  Filled 2020-08-19 (×4): qty 1

## 2020-08-19 MED ORDER — TRAMADOL HCL 50 MG PO TABS
50.0000 mg | ORAL_TABLET | Freq: Four times a day (QID) | ORAL | Status: DC | PRN
  Administered 2020-08-19 – 2020-08-20 (×4): 50 mg via ORAL
  Filled 2020-08-19 (×5): qty 1

## 2020-08-19 MED ORDER — IBUPROFEN 400 MG PO TABS: 600.0000 mg | ORAL_TABLET | Freq: Once | ORAL | Status: DC

## 2020-08-19 MED ORDER — SULFAMETHOXAZOLE-TRIMETHOPRIM 800-160 MG PO TABS
1.0000 | ORAL_TABLET | ORAL | Status: DC
  Administered 2020-08-20: 1 via ORAL
  Filled 2020-08-19: qty 1

## 2020-08-19 MED ORDER — BICTEGRAVIR-EMTRICITAB-TENOFOV 50-200-25 MG PO TABS
1.0000 | ORAL_TABLET | Freq: Every day | ORAL | Status: DC
  Administered 2020-08-19 – 2020-08-20 (×2): 1 via ORAL
  Filled 2020-08-19 (×2): qty 1

## 2020-08-19 MED ORDER — DULOXETINE HCL 20 MG PO CPEP
20.0000 mg | ORAL_CAPSULE | Freq: Every day | ORAL | Status: DC
  Administered 2020-08-19 – 2020-08-20 (×2): 20 mg via ORAL
  Filled 2020-08-19 (×3): qty 1

## 2020-08-19 MED ORDER — GABAPENTIN 300 MG PO CAPS
900.0000 mg | ORAL_CAPSULE | Freq: Three times a day (TID) | ORAL | Status: DC
  Administered 2020-08-19 – 2020-08-20 (×5): 900 mg via ORAL
  Filled 2020-08-19 (×5): qty 3

## 2020-08-19 MED ORDER — LEVOTHYROXINE SODIUM 25 MCG PO TABS
25.0000 ug | ORAL_TABLET | Freq: Every day | ORAL | Status: DC
  Administered 2020-08-19 – 2020-08-20 (×2): 25 ug via ORAL
  Filled 2020-08-19 (×2): qty 1

## 2020-08-19 NOTE — ED Notes (Signed)
Social work at bedside.  

## 2020-08-19 NOTE — ED Notes (Signed)
PT at bedside.

## 2020-08-19 NOTE — Evaluation (Signed)
Physical Therapy Evaluation Patient Details Name: Travis Mcgee MRN: [id number] DOB: 05-Apr-1981 Today's Date: 08/19/2020   History of Present Illness  39 year old male with a history of HIV, prior syphilis and GERD who presents with leg weakness.  He was recently admitted for bilateral leg weakness and paresthesias.  He was treated for possible Guillain-Barr versus neurosyphilis.He was treated with 5 days of IVIG and also penicillin.  He was discharged earlier 10/23 and fell 3 times since discharge returning to ED that evening after he injured his right foot and ankle during one of the falls.  Pt reports increased difficulty walking due to increased paresthesias in bilateral LE.   Clinical Impression  Pt known to therapist from prior hospitalization. Pt discharged yesterday and was seen by therapist to be able to walk halls with cane or use of handrails. Pt has poor living conditions and no support. He reports experiencing increased paresthesia in bilateral LE and experienced 3x falls as he was trying to walk across uneven ground. Pt is currently limited in safe mobility by 9/10 pain in bilateral LE, and increased weakness due to pain. Pt experiences increase in HR to 145 bpm with standing at EoB. Pt requires min guard for bed mobility and transfers however is only able to tolerate 5 feet ambulation with RW due to pain and decreased proprioception. PT recommends SNF level rehab to progress mobility, PT recognizes pt is a difficult placement, however he will need further rehab to be able to return to his current living conditions. Pt reports working with [facility]il[facility]ige[facility] and [facility] to procure housing, which will be the best long term solution. Pt will likely be able to progress mobility with decrease in his LE pain.     Follow Up Recommendations SNF    Equipment Recommendations  None recommended by PT       Precautions / Restrictions Precautions Precautions: Fall Precaution  Comments: Has had multiple falls.  Required Braces or Orthoses: Other Brace Other Brace: R ASO for ankle sprain  Restrictions Weight Bearing Restrictions: No      Mobility  Bed Mobility Overal bed mobility: Needs Assistance Bed Mobility: Supine to Sit;Sit to Supine     Supine to sit: Min guard Sit to supine: Min guard   General bed mobility comments: min guard for safety, pt provides assist for LE movement with UE    Transfers Overall transfer level: Needs assistance Equipment used: Rolling walker (2 wheeled) Transfers: Sit to/from Stand Sit to Stand: Min guard         General transfer comment: min guard for safety, increased UE support on RW to power up to standing, and decreased weightbearing in standing due to pain   Ambulation/Gait Ambulation/Gait assistance: Min guard Gait Distance (Feet): 5 Feet Assistive device: Rolling walker (2 wheeled) Gait Pattern/deviations: Step-through pattern;Decreased stride length;Trunk flexed Gait velocity: Decreased Gait velocity interpretation: <1.31 ft/sec, indicative of household ambulator General Gait Details: min guard for safety, able to step laterally and back and forth from bed, with increased UE support to decrease weightbearing, unable to progress due to pain          Balance Overall balance assessment: Needs assistance Sitting-balance support: Feet unsupported Sitting balance-Leahy Scale: Fair     Standing balance support: Bilateral upper extremity supported Standing balance-Leahy Scale: Poor Standing balance comment: requires UE support due to pain  Pertinent Vitals/Pain Pain Assessment: 0-10 Pain Score: 9  Pain Location: BLE worsen distally  Pain Descriptors / Indicators: Pins and needles;Tingling;Numbness Pain Intervention(s): Monitored during session;Patient requesting pain meds-RN notified;Repositioned;Limited activity within patient's tolerance    Home Living  Family/patient expects to be discharged to:: Private residence Living Arrangements: Alone Available Help at Discharge: Other (Comment) (no one) Type of Home: Other(Comment) (States he lives in a shed. No running water, and a hot plate) Home Access: Level entry     Home Layout: One level Home Equipment: None Additional Comments: Uses a bucket of water to bathe. Does not have a formal bathroom. Goes outside to use the bathroom.     Prior Function Level of Independence: Independent with assistive device(s)         Comments: discharged 10/23 able to ambulate >470 ft with cane indepedently      Hand Dominance   Dominant Hand: Right    Extremity/Trunk Assessment   Upper Extremity Assessment Upper Extremity Assessment: Overall WFL for tasks assessed    Lower Extremity Assessment Lower Extremity Assessment: RLE deficits/detail;LLE deficits/detail RLE Deficits / Details: ROM limited by pain, strength grossly assessed during movement 3/5, R ankle sprain further reducing ROM  RLE: Unable to fully assess due to pain RLE Sensation: decreased proprioception (sensative to light touch ) RLE Coordination: decreased fine motor LLE: Unable to fully assess due to pain LLE Sensation: decreased proprioception (sensative to light touch ) LLE Coordination: decreased fine motor    Cervical / Trunk Assessment Cervical / Trunk Assessment: Normal  Communication   Communication: No difficulties  Cognition Arousal/Alertness: Awake/alert Behavior During Therapy: WFL for tasks assessed/performed Overall Cognitive Status: Within Functional Limits for tasks assessed                                 General Comments: overall WFL for simple tasks      General Comments General comments (skin integrity, edema, etc.): HR to 145 with weightbearing at EoB, painful to light touch    Exercises General Exercises - Lower Extremity Ankle Circles/Pumps: AROM;Supine;5 reps;Both Quad Sets:  AROM;Both;Supine Heel Slides: AROM;Both;Supine Hip ABduction/ADduction: AROM;Both;Supine Straight Leg Raises: AROM;10 reps;Supine Hip Flexion/Marching: AROM;Both;Seated Heel Raises: AROM;Both;10 reps;Standing Mini-Sqauts: AROM;Both;10 reps;Standing   Assessment/Plan    PT Assessment Patient needs continued PT services  PT Problem List Decreased strength;Decreased balance;Decreased mobility;Decreased safety awareness;Decreased activity tolerance;Pain;Decreased coordination;Decreased range of motion       PT Treatment Interventions Gait training;DME instruction;Functional mobility training;Therapeutic activities;Therapeutic exercise;Balance training;Patient/family education    PT Goals (Current goals can be found in the Care Plan section)  Acute Rehab PT Goals Patient Stated Goal: walk without falling  PT Goal Formulation: With patient Time For Goal Achievement: 08/30/20 Potential to Achieve Goals: Fair    Frequency Min 2X/week   Barriers to discharge Decreased caregiver support;Inaccessible home environment         AM-PAC PT "6 Clicks" Mobility  Outcome Measure Help needed turning from your back to your side while in a flat bed without using bedrails?: None Help needed moving from lying on your back to sitting on the side of a flat bed without using bedrails?: None Help needed moving to and from a bed to a chair (including a wheelchair)?: A Little Help needed standing up from a chair using your arms (e.g., wheelchair or bedside chair)?: A Little Help needed to walk in hospital room?: Total Help needed climbing 3-5 steps with a  railing? : Total 6 Click Score: 16    End of Session Equipment Utilized During Treatment: Gait belt Activity Tolerance: Patient limited by pain Patient left: in bed;with call bell/phone within reach Nurse Communication: Mobility status PT Visit Diagnosis: Unsteadiness on feet (R26.81);Muscle weakness (generalized) (M62.81);Pain Pain - Right/Left:   (bilateral ) Pain - part of body: Leg    Time: 0921-0950 PT Time Calculation (min) (ACUTE ONLY): 29 min   Charges:     PT Treatments $Therapeutic Exercise: 8-22 mins        Jayjay Littles B. Beverely Risen PT, DPT Acute Rehabilitation Services Pager [phone] Office [phone]   Elon Alas Fleet 08/19/2020, 10:52 AM
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
## 2020-08-19 NOTE — Progress Notes (Signed)
MSW Intern met with patient to discuss options after PT/OT has met with patient.  Patient provided some social history; is from [place] and adoptive family lives there. Patient has no contact with adoptive family and provided no social support information. Patient was living in a shed. Patient states that after he was released from hospital yesterday, he became weak in his leg and fell 4xs. Patient sees HIV specialist, "Dr. Mendel Ryder" in a brick building near the hospital. Patient denied any mental health hx or SA hx. Patient has been working with Olevia Perches with the [facility] and says he was approved for housing at a place in [place] but couldn't provide any further information.   TOC will continue to follow for discharge needs.       08/19/20 0936  TOC ED Mini Assessment  TOC Time spent with patient (minutes): 15  PING Used in TOC Assessment No  Admission or Readmission Diverted No  What brought you to the Emergency Department?  a fall  Barriers to Discharge Continued Medical Work up;Homeless with medical needs  Patient states their goals for this hospitalization and ongoing recovery are: I want to feel better and get into the housing program that I got approved for.

## 2020-08-19 NOTE — Progress Notes (Signed)
Orthopedic Tech Progress Note Patient Details:  Travis Mcgee 02/25/1981 [id number]  Ortho Devices Type of Ortho Device: ASO Ortho Device/Splint Location: rle Ortho Device/Splint Interventions: Ordered, Application, Adjustment   Post Interventions Patient Tolerated: Well Instructions Provided: Care of device, Adjustment of device   [name] Post 08/19/2020, 7:24 AM

## 2020-08-20 ENCOUNTER — Ambulatory Visit: Payer: Non-veteran care | Admitting: Infectious Diseases

## 2020-08-20 NOTE — ED Provider Notes (Signed)
I was informed by social worker that there is no way to place patient given his lack of insurance.  He has been ambulatory with a walker here in the emergency department and so he will be given a walker prior to discharge after discussion with case management.   Pricilla Loveless, MD 08/20/20 1242

## 2020-08-20 NOTE — ED Notes (Signed)
Lunch Tray Ordered @ 1003. 

## 2020-08-20 NOTE — Discharge Planning (Signed)
Oletta Cohn, RN, BSN, [place] [phone] Pt qualifies for DME rolling walker.  DME  ordered through [employer].  Rolling walker will be delivered to pt room prior to D/C home.

## 2020-08-20 NOTE — ED Notes (Signed)
DME walker brought by equipment staff. Pt unable to pay copay for walker, SW notified. Per pt, SW and [place] SW discussing options. Pt states he is unable to walk.

## 2020-08-20 NOTE — Discharge Instructions (Signed)
[facility] [place] [phone] FOR ASSISTANCE WITH HOUSING!!!!  DAY CENTERS [facility]) Monday - Friday 8am - 3pm          Sat & Sun 8am - 2pm [address] [place], [place] [address]   [phone]     www.[email or web address] [facility] offers among other critical resources: showers, laundry, barbershop, phone bank, mailroom, computer lab, medical clinic, gardens and a bike maintenance area.   AREA SHELTERS  [facility] [employer]  (Men & women) [address] [place] [phone]  [facility] (Men/women/families) [address] [place] [phone]   [facility] (Families with children) [phone] [address] [place] [address]  [place] [phone]   [facility] (Domestic Violence Shelter) [address]  [place] [phone]   [facility] (Children ages 66-17) [address]  [place] [phone]   [facility]   (Women & children) [address] [place] [phone]   [address] (Women/substance abuse) [address]  [place] [phone]   [facility] (Men) [address]   [phone]   [facility] (Men) [address]  [place] [phone]  [employer] [facility] (Women) [phone] [address]  [place] [phone]   [facility] (Single women & women with children) [address]  [place] [phone]  [facility] (Men/women/families) [address] [phone]    [facility]   (Domestic Violence shelter) [address]   [phone]   [facility] (Men & women) [address]  [place] [phone]  [facility] (Men) [address]  [place] [phone]   [facility] (Men) [address]  [place] [phone]   [facility] (Single women & families) [address]    [place] [phone]  [facility] (Men/women & families) [address]  [place] [phone]    If you are at risk of losing your housing (throughout [facility]) call the Housing Hotline at [phone]. You may also contact 2-1-1, a FREE service of the [place] [address] that provides information about many resources including housing. Dial 211, or visit online at PooledIncome.pl.

## 2020-08-20 NOTE — ED Provider Notes (Signed)
Emergency Medicine Observation Re-evaluation Note  Travis Mcgee is a 39 y.o. male, seen on rounds today.  Pt initially presented to the ED for complaints of Fall Currently, the patient is waiting for placement.  Physical Exam  BP 117/86   Pulse (!) 101   Temp 98.7 F (37.1 C) (Oral)   Resp 16   SpO2 98%  Physical Exam General: resting in stretcher, no acute distress Cardiac: HR ~100 Lungs: no increased WOB Psych: no psychosis  ED Course / MDM  EKG:EKG Interpretation  Date/Time:  Sunday August 19 2020 02:33:03 EDT Ventricular Rate:  112 PR Interval:    QRS Duration: 89 QT Interval:  349 QTC Calculation: 477 R Axis:   90 Text Interpretation: Sinus tachycardia Borderline right axis deviation Borderline prolonged QT interval since last tracing no significant change Confirmed by Rolan Bucco [phone]) on 08/19/2020 5:32:04 AM    I have reviewed the labs performed to date as well as medications administered while in observation.  Recent changes in the last 24 hours include none.  Plan  Current plan is for placement. Patient is not under full IVC at this time.   Pricilla Loveless, MD 08/20/20 [phone]

## 2020-08-20 NOTE — ED Notes (Signed)
Patient ambulated independently with rolling walker to bathroom in hallway. Standby assist available for safety and to observe gait. Patient demonstrated correct use of walker. Patient states the activity was tiresome but achievable. Patient returned to bed, comfort measures provided, vital signs stable at this time.

## 2020-08-20 NOTE — Social Work (Signed)
CSW met with Travis Mcgee at bedside. Although Travis Mcgee was initially expected to go to SNF for Travis Mcgee rehab, Travis Mcgee does not have insurance coverage.   Travis Mcgee is working with [employer] social worker on homelessness issues Lattie Haw @704 [phone], but is not connected with [place] medical benefits as yet.   Since Travis Mcgee is homeless, with no insurance, no home health Travis Mcgee could be set up. CSW worked with Travis Mcgee to find an alternative and Travis Mcgee decided to go to [facility] for shelter.  CSW arranged transportation through [place] for Travis Mcgee to go to get to [facility]. Travis Mcgee expressed appreciation.

## 2020-08-20 NOTE — Discharge Planning (Signed)
Pt active at [facility] NP: Ave Filter SW: Gigi Gin   Pager: [phone] Desk phone: [phone]

## 2020-08-20 NOTE — Progress Notes (Addendum)
CSW spoke with Wille Celeste at [facility] due to it being the patient's only bed offer - however, this patient is not adequately insured for SNF placement.   CSW attempted to reach staff member "Andree Moro" at the [facility] without success - CSW spoke with several staff attempting to locate that individual and was informed nobody by that name is employed by the [place].   CSW informed Dr. Criss Alvine of information.  Edwin Dada, MSW, LCSW-A Transitions of Care  Clinical Social Worker  [facility] Emergency Departments  Medical ICU [phone]

## 2020-08-22 LAB — ACID FAST SMEAR (AFB, MYCOBACTERIA)

## 2020-08-23 ENCOUNTER — Inpatient Hospital Stay: Payer: No Typology Code available for payment source | Admitting: Infectious Diseases

## 2020-08-23 LAB — FLOW CYTOMETRY

## 2020-08-28 LAB — FUNGUS CULTURE WITH STAIN

## 2020-08-28 LAB — FUNGAL ORGANISM REFLEX

## 2020-08-28 LAB — FUNGUS CULTURE RESULT

## 2020-08-30 LAB — PORPHYRINS, FRACTIONATION-PLASMA
Coproporphyrin.: <1 ug/dL (ref <1.0)
Heptacarboxyl Porphyrins: <1 ug/dL (ref <1.0)
Hexacarboxyl Porphyrins: <1.5 ug/dL (ref <1.5)
Pentacarboxyl Porphyrins: <1 ug/dL (ref <1.0)
Protoporphyrin: <1.5 ug/dL (ref <1.5)
Uroporphyrin: <1.5 ug/dL (ref <1.5)

## 2020-09-13 ENCOUNTER — Telehealth: Payer: Self-pay | Admitting: *Deleted

## 2020-09-13 NOTE — Telephone Encounter (Signed)
Attempted to contact patient. The number listed in chart is not accepting calls at this time. [phone]. Bridge referral?

## 2020-09-13 NOTE — Telephone Encounter (Signed)
Received disability/confirmation of diagnosis paperwork for patient. He has not been to clinic in more than a year, no-showed recent appointments with Judeth Cornfield. Placed paperwork in Greg's box, as he was the last provider to see patient (while hospitalized). Andree Coss, RN

## 2020-09-13 NOTE — Telephone Encounter (Signed)
Will need office visit if he wants that completed

## 2020-09-24 NOTE — Telephone Encounter (Signed)
Sending to [place], will see if Marthann Schiller can send up to [place] at state level. Form completed by Tammy Sours, has patient's new address of [address] [place], [place] [address], phone number [phone]. RN made a copy of the form for scanning into chart, mailed original in envelope supplied. Andree Coss, RN

## 2020-10-03 LAB — ACID FAST CULTURE WITH REFLEXED SENSITIVITIES (MYCOBACTERIA): Acid Fast Culture: NEGATIVE

## 2021-02-03 ENCOUNTER — Other Ambulatory Visit: Payer: Self-pay

## 2021-02-03 ENCOUNTER — Inpatient Hospital Stay (HOSPITAL_COMMUNITY)
Admission: EM | Admit: 2021-02-03 | Discharge: 2021-02-11 | DRG: 975 | Disposition: A | Discharge status: Home / Self Care | Payer: Medicaid Other | Attending: Internal Medicine | Admitting: Internal Medicine

## 2021-02-03 ENCOUNTER — Encounter (HOSPITAL_COMMUNITY): Payer: Self-pay

## 2021-02-03 ENCOUNTER — Emergency Department (HOSPITAL_COMMUNITY): Payer: Medicaid Other

## 2021-02-03 DIAGNOSIS — Z79899 Other long term (current) drug therapy: Secondary | ICD-10-CM | POA: Diagnosis not present

## 2021-02-03 DIAGNOSIS — E871 Hypo-osmolality and hyponatremia: Secondary | ICD-10-CM | POA: Diagnosis present

## 2021-02-03 DIAGNOSIS — B20 Human immunodeficiency virus [HIV] disease: Principal | ICD-10-CM | POA: Diagnosis present

## 2021-02-03 DIAGNOSIS — F1010 Alcohol abuse, uncomplicated: Secondary | ICD-10-CM

## 2021-02-03 DIAGNOSIS — Z8619 Personal history of other infectious and parasitic diseases: Secondary | ICD-10-CM | POA: Diagnosis present

## 2021-02-03 DIAGNOSIS — Y92230 Patient room in hospital as the place of occurrence of the external cause: Secondary | ICD-10-CM | POA: Diagnosis not present

## 2021-02-03 DIAGNOSIS — Z79890 Hormone replacement therapy (postmenopausal): Secondary | ICD-10-CM

## 2021-02-03 DIAGNOSIS — Z9181 History of falling: Secondary | ICD-10-CM | POA: Diagnosis not present

## 2021-02-03 DIAGNOSIS — Z5901 Sheltered homelessness: Secondary | ICD-10-CM

## 2021-02-03 DIAGNOSIS — B3781 Candidal esophagitis: Secondary | ICD-10-CM | POA: Diagnosis present

## 2021-02-03 DIAGNOSIS — G629 Polyneuropathy, unspecified: Secondary | ICD-10-CM | POA: Diagnosis present

## 2021-02-03 DIAGNOSIS — G610 Guillain-Barre syndrome: Secondary | ICD-10-CM | POA: Diagnosis present

## 2021-02-03 DIAGNOSIS — R7989 Other specified abnormal findings of blood chemistry: Secondary | ICD-10-CM | POA: Diagnosis present

## 2021-02-03 DIAGNOSIS — R7401 Elevation of levels of liver transaminase levels: Secondary | ICD-10-CM | POA: Diagnosis not present

## 2021-02-03 DIAGNOSIS — R29898 Other symptoms and signs involving the musculoskeletal system: Secondary | ICD-10-CM | POA: Diagnosis present

## 2021-02-03 DIAGNOSIS — R112 Nausea with vomiting, unspecified: Secondary | ICD-10-CM

## 2021-02-03 DIAGNOSIS — U071 COVID-19: Secondary | ICD-10-CM | POA: Diagnosis present

## 2021-02-03 DIAGNOSIS — T50916A Underdosing of multiple unspecified drugs, medicaments and biological substances, initial encounter: Secondary | ICD-10-CM | POA: Diagnosis present

## 2021-02-03 DIAGNOSIS — Z91138 Patient's unintentional underdosing of medication regimen for other reason: Secondary | ICD-10-CM | POA: Diagnosis not present

## 2021-02-03 DIAGNOSIS — K219 Gastro-esophageal reflux disease without esophagitis: Secondary | ICD-10-CM | POA: Diagnosis present

## 2021-02-03 DIAGNOSIS — D72819 Decreased white blood cell count, unspecified: Secondary | ICD-10-CM | POA: Diagnosis present

## 2021-02-03 DIAGNOSIS — W06XXXA Fall from bed, initial encounter: Secondary | ICD-10-CM | POA: Diagnosis not present

## 2021-02-03 DIAGNOSIS — R202 Paresthesia of skin: Secondary | ICD-10-CM

## 2021-02-03 DIAGNOSIS — Z21 Asymptomatic human immunodeficiency virus [HIV] infection status: Secondary | ICD-10-CM | POA: Diagnosis present

## 2021-02-03 DIAGNOSIS — F17210 Nicotine dependence, cigarettes, uncomplicated: Secondary | ICD-10-CM | POA: Diagnosis present

## 2021-02-03 DIAGNOSIS — E876 Hypokalemia: Secondary | ICD-10-CM | POA: Diagnosis present

## 2021-02-03 DIAGNOSIS — M79606 Pain in leg, unspecified: Secondary | ICD-10-CM | POA: Diagnosis not present

## 2021-02-03 HISTORY — DX: Guillain-Barre syndrome: G61.0

## 2021-02-03 LAB — CBC WITH DIFFERENTIAL/PLATELET
Abs Immature Granulocytes: 0.02 10*3/uL (ref 0.00–0.07)
Basophils Absolute: 0 10*3/uL (ref 0.0–0.1)
Basophils Relative: 1 %
Eosinophils Absolute: 0.1 10*3/uL (ref 0.0–0.5)
Eosinophils Relative: 2 %
HCT: 40.7 % (ref 39.0–52.0)
Hemoglobin: 13.5 g/dL (ref 13.0–17.0)
Immature Granulocytes: 1 %
Lymphocytes Relative: 22 %
Lymphs Abs: 0.8 10*3/uL (ref 0.7–4.0)
MCH: 33.5 pg (ref 26.0–34.0)
MCHC: 33.2 g/dL (ref 30.0–36.0)
MCV: 101 fL — ABNORMAL HIGH (ref 80.0–100.0)
Monocytes Absolute: 0.3 10*3/uL (ref 0.1–1.0)
Monocytes Relative: 9 %
Neutro Abs: 2.4 10*3/uL (ref 1.7–7.7)
Neutrophils Relative %: 65 %
Platelets: 179 10*3/uL (ref 150–400)
RBC: 4.03 MIL/uL — ABNORMAL LOW (ref 4.22–5.81)
RDW: 18.8 % — ABNORMAL HIGH (ref 11.5–15.5)
WBC: 3.6 10*3/uL — ABNORMAL LOW (ref 4.0–10.5)
nRBC: 0 % (ref 0.0–0.2)

## 2021-02-03 LAB — COMPREHENSIVE METABOLIC PANEL
ALT: 103 U/L — ABNORMAL HIGH (ref 0–44)
AST: 192 U/L — ABNORMAL HIGH (ref 15–41)
Albumin: 2.5 g/dL — ABNORMAL LOW (ref 3.5–5.0)
Alkaline Phosphatase: 486 U/L — ABNORMAL HIGH (ref 38–126)
Anion gap: 14 (ref 5–15)
BUN: <5 mg/dL — ABNORMAL LOW (ref 6–20)
CO2: 26 mmol/L (ref 22–32)
Calcium: 8.3 mg/dL — ABNORMAL LOW (ref 8.9–10.3)
Chloride: 93 mmol/L — ABNORMAL LOW (ref 98–111)
Creatinine, Ser: 0.67 mg/dL (ref 0.61–1.24)
GFR, Estimated: >60 mL/min (ref >60)
Glucose, Bld: 100 mg/dL — ABNORMAL HIGH (ref 70–99)
Potassium: 3.2 mmol/L — ABNORMAL LOW (ref 3.5–5.1)
Sodium: 133 mmol/L — ABNORMAL LOW (ref 135–145)
Total Bilirubin: 1.6 mg/dL — ABNORMAL HIGH (ref 0.3–1.2)
Total Protein: 8.8 g/dL — ABNORMAL HIGH (ref 6.5–8.1)

## 2021-02-03 LAB — URINALYSIS, ROUTINE W REFLEX MICROSCOPIC
Glucose, UA: NEGATIVE mg/dL
Hgb urine dipstick: NEGATIVE
Ketones, ur: 5 mg/dL — AB
Nitrite: NEGATIVE
Protein, ur: 100 mg/dL — AB
Specific Gravity, Urine: 1.026 (ref 1.005–1.030)
pH: 5 (ref 5.0–8.0)

## 2021-02-03 LAB — RESP PANEL BY RT-PCR (FLU A&B, COVID) ARPGX2
Influenza A by PCR: NEGATIVE
Influenza B by PCR: NEGATIVE
SARS Coronavirus 2 by RT PCR: POSITIVE — AB

## 2021-02-03 LAB — LIPASE, BLOOD: Lipase: 21 U/L (ref 11–51)

## 2021-02-03 IMAGING — US US ABDOMEN LIMITED RUQ/ASCITES
1 series · 14 of 25 positions shown · non-contrast
Comparison: No comparison studies available.

CLINICAL DATA: Elevated LFTs.

EXAM:
ULTRASOUND ABDOMEN LIMITED RIGHT UPPER QUADRANT

[Series 1: us abdomen limited ruq/ascites · 14 of 523 slices shown]
[im 1/523]
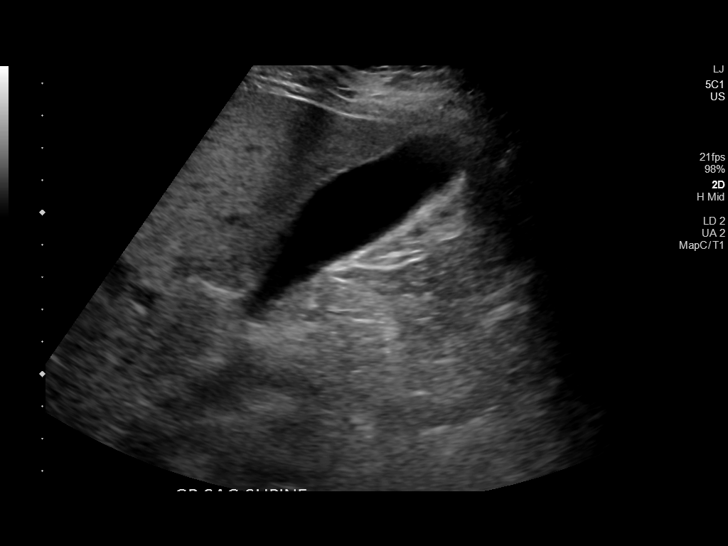
[im 44/523]
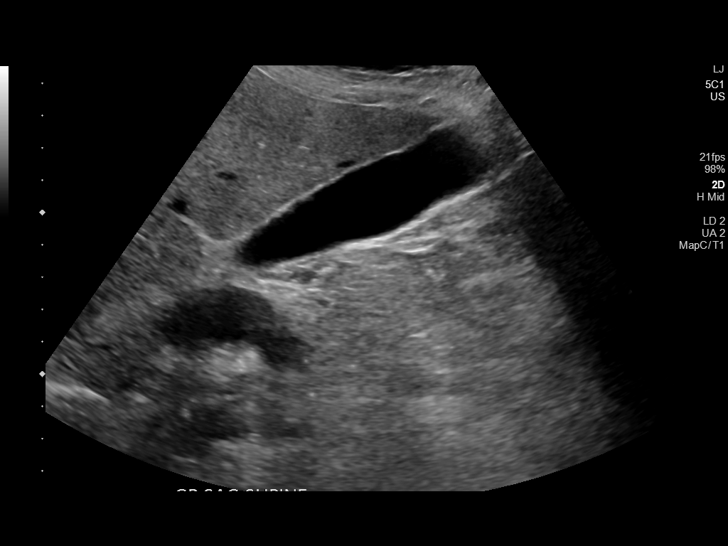
[im 88/523]
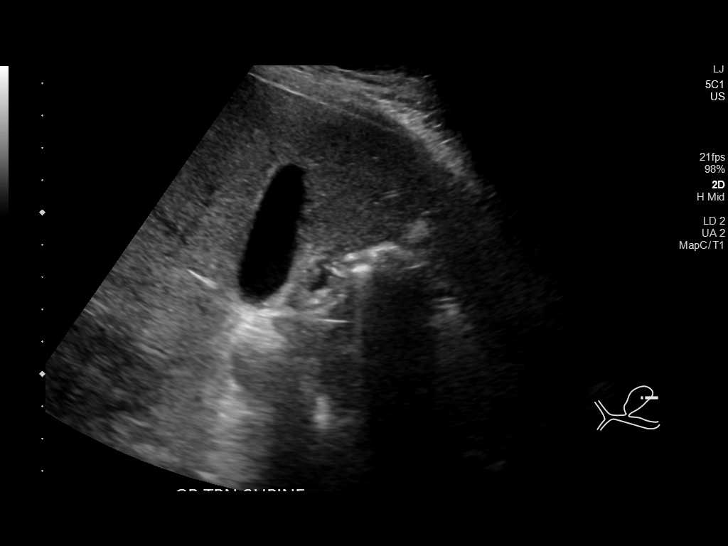
[im 131/523]
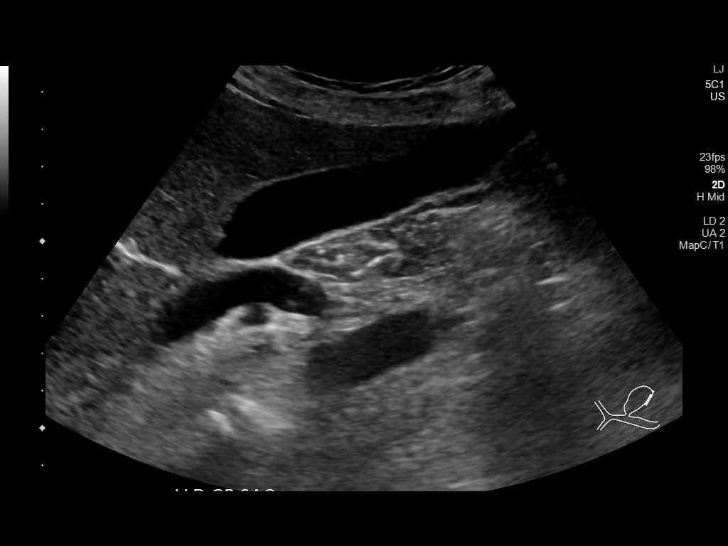
[im 175/523]
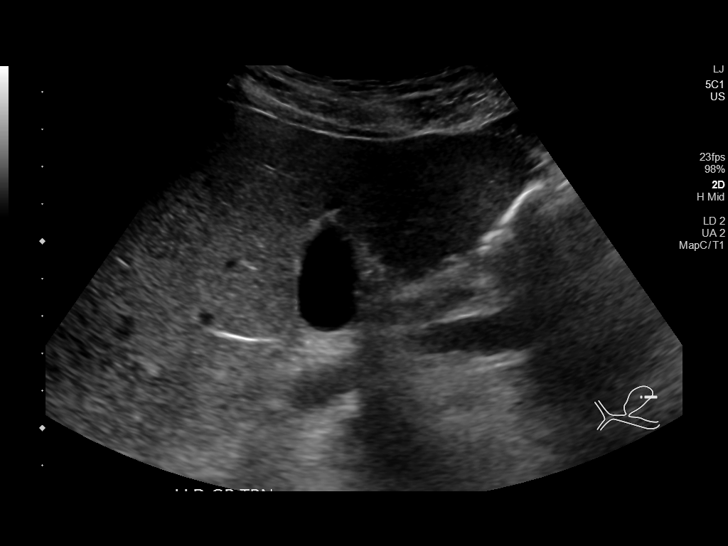
[im 196/523]
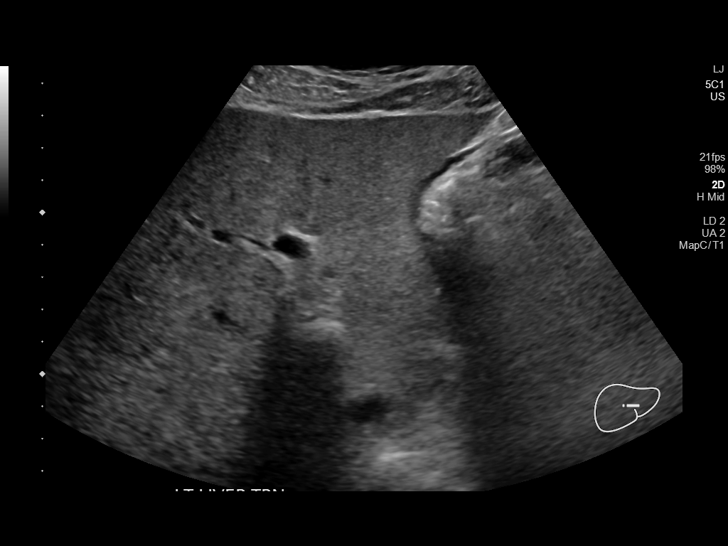
[im 240/523]
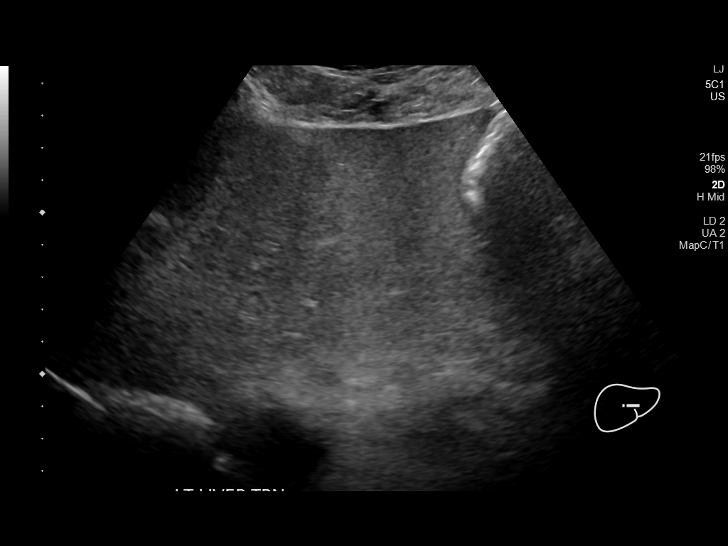
[im 283/523]
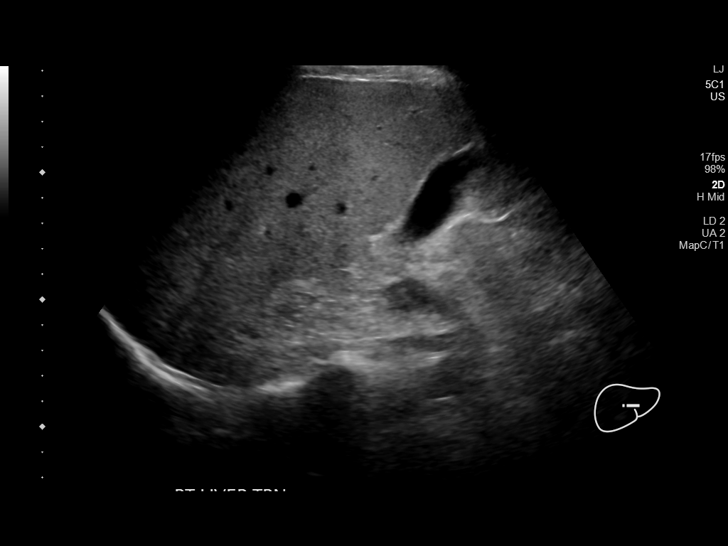
[im 327/523]
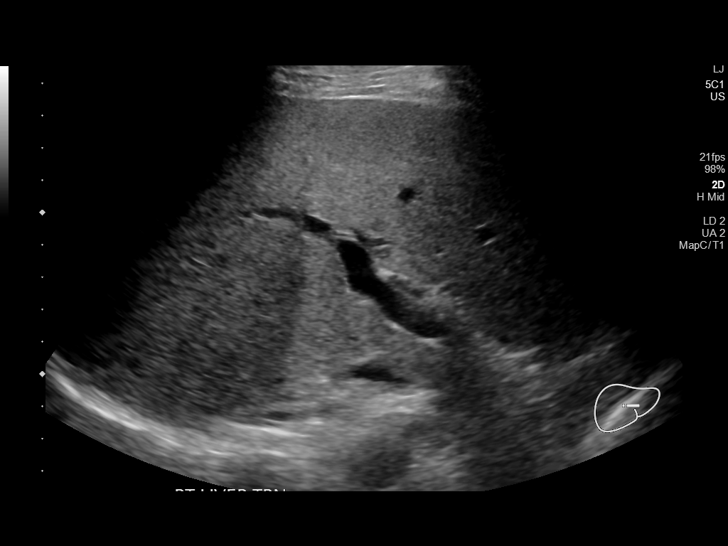
[im 349/523]
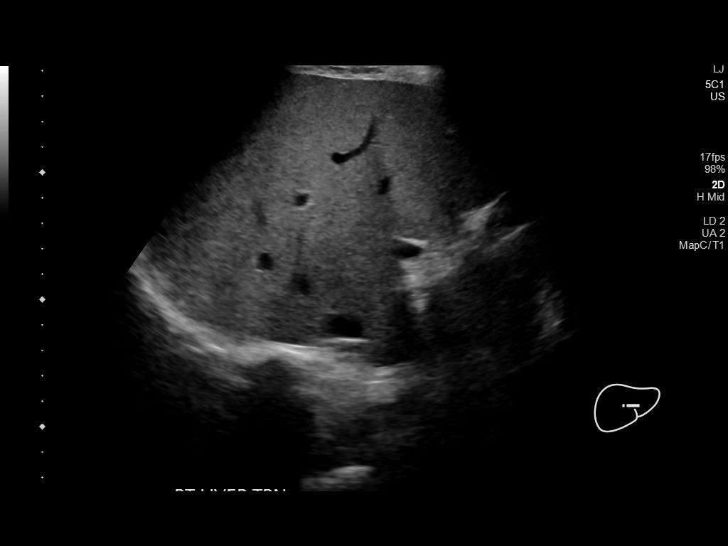
[im 392/523]
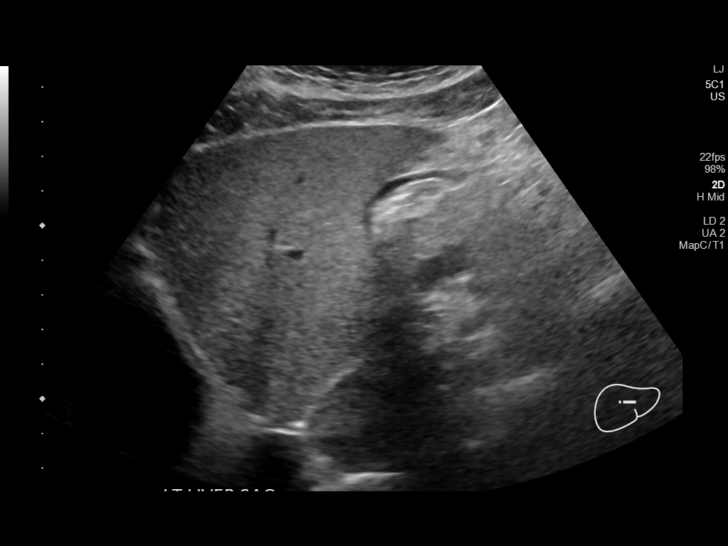
[im 436/523]
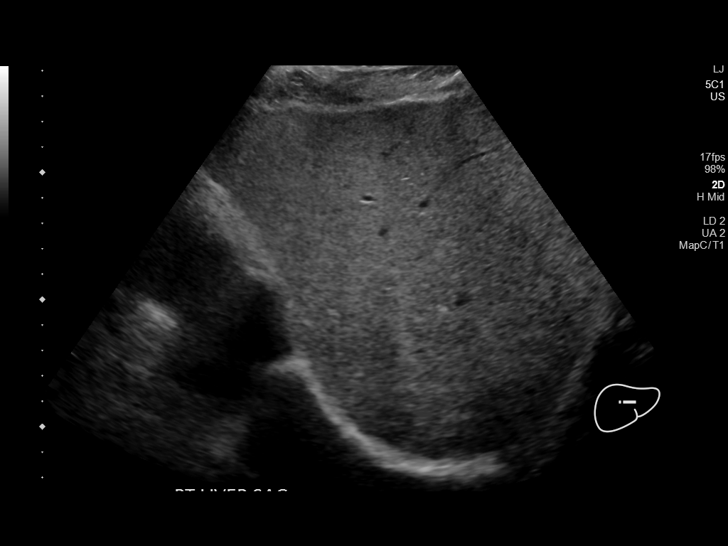
[im 479/523]
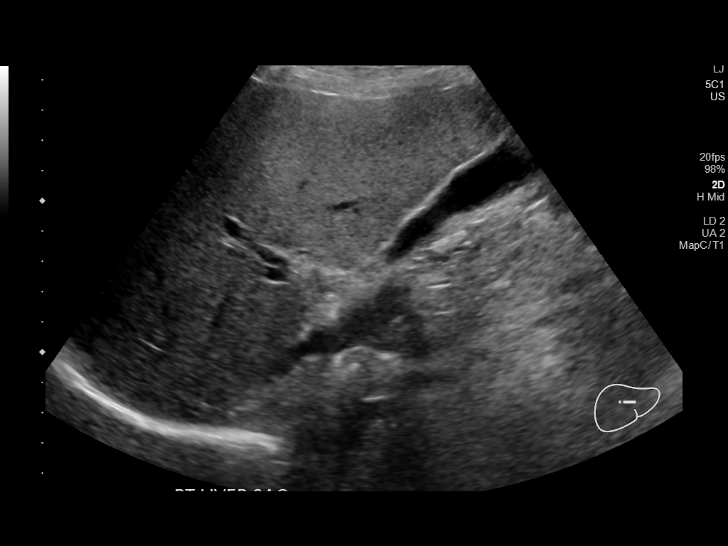
[im 523/523]
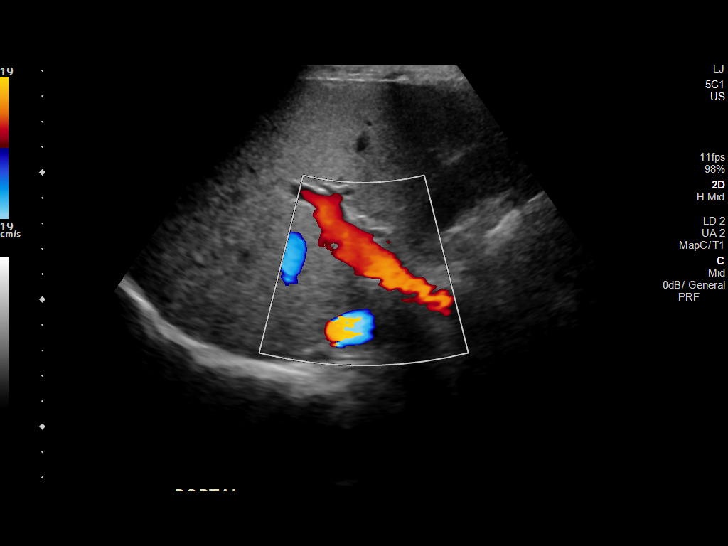

[14 of 25 positions shown; findings below may reference images not displayed]

FINDINGS: Gallbladder:

No gallstones or wall thickening visualized. No sonographic Murphy
sign noted by sonographer.

Common bile duct:

Diameter: 5 mm

Liver:

Mild coarsening of the hepatic echotexture suggests fatty
deposition. Portal vein is patent on color Doppler imaging with
normal direction of blood flow towards the liver.

Other: None.
IMPRESSION: Probable hepatic steatosis.  Otherwise unremarkable.

## 2021-02-03 MED ORDER — THIAMINE HCL 100 MG/ML IJ SOLN
100.0000 mg | Freq: Every day | INTRAMUSCULAR | Status: DC
  Administered 2021-02-03: 100 mg via INTRAVENOUS
  Filled 2021-02-03 (×2): qty 2

## 2021-02-03 MED ORDER — GABAPENTIN 300 MG PO CAPS
900.0000 mg | ORAL_CAPSULE | Freq: Three times a day (TID) | ORAL | Status: DC
  Administered 2021-02-03 – 2021-02-07 (×13): 900 mg via ORAL
  Filled 2021-02-03 (×14): qty 3

## 2021-02-03 MED ORDER — LACTATED RINGERS IV SOLN: INTRAVENOUS | Status: AC

## 2021-02-03 MED ORDER — ONDANSETRON 4 MG PO TBDP
4.0000 mg | ORAL_TABLET | Freq: Once | ORAL | Status: AC
  Administered 2021-02-03: 4 mg via ORAL
  Filled 2021-02-03: qty 1

## 2021-02-03 MED ORDER — FENTANYL CITRATE (PF) 100 MCG/2ML IJ SOLN
50.0000 ug | Freq: Once | INTRAMUSCULAR | Status: AC
Start: 2021-02-03 — End: 2021-02-03
  Administered 2021-02-03: 50 ug via INTRAVENOUS
  Filled 2021-02-03: qty 2

## 2021-02-03 MED ORDER — ADULT MULTIVITAMIN W/MINERALS CH
1.0000 | ORAL_TABLET | Freq: Every day | ORAL | Status: DC
  Administered 2021-02-03 – 2021-02-11 (×9): 1 via ORAL
  Filled 2021-02-03 (×9): qty 1

## 2021-02-03 MED ORDER — LACTATED RINGERS IV BOLUS
1000.0000 mL | Freq: Once | INTRAVENOUS | Status: AC
  Administered 2021-02-03: 1000 mL via INTRAVENOUS

## 2021-02-03 MED ORDER — THIAMINE HCL 100 MG PO TABS
100.0000 mg | ORAL_TABLET | Freq: Every day | ORAL | Status: DC
  Administered 2021-02-03 – 2021-02-07 (×5): 100 mg via ORAL
  Filled 2021-02-03 (×5): qty 1

## 2021-02-03 MED ORDER — ONDANSETRON HCL 4 MG/2ML IJ SOLN
4.0000 mg | Freq: Three times a day (TID) | INTRAMUSCULAR | Status: DC | PRN
  Administered 2021-02-03: 4 mg via INTRAVENOUS
  Filled 2021-02-03: qty 2

## 2021-02-03 MED ORDER — LEVOTHYROXINE SODIUM 25 MCG PO TABS
25.0000 ug | ORAL_TABLET | Freq: Every day | ORAL | Status: DC
  Administered 2021-02-04 – 2021-02-11 (×8): 25 ug via ORAL
  Filled 2021-02-03 (×8): qty 1

## 2021-02-03 MED ORDER — MORPHINE SULFATE (PF) 2 MG/ML IV SOLN
2.0000 mg | INTRAVENOUS | Status: DC | PRN
Start: 2021-02-03 — End: 2021-02-04
  Administered 2021-02-03 – 2021-02-04 (×4): 2 mg via INTRAVENOUS
  Filled 2021-02-03 (×4): qty 1

## 2021-02-03 MED ORDER — GABAPENTIN 300 MG PO CAPS: 300.0000 mg | ORAL_CAPSULE | Freq: Three times a day (TID) | ORAL | Status: DC

## 2021-02-03 MED ORDER — IMMUNE GLOBULIN (HUMAN) 10 GM/100ML IV SOLN
25.0000 g | INTRAVENOUS | Status: AC
  Administered 2021-02-03 – 2021-02-07 (×5): 25 g via INTRAVENOUS
  Filled 2021-02-03: qty 200
  Filled 2021-02-03: qty 50
  Filled 2021-02-03: qty 200
  Filled 2021-02-03 (×2): qty 50

## 2021-02-03 MED ORDER — ENOXAPARIN SODIUM 40 MG/0.4ML ~~LOC~~ SOLN
40.0000 mg | SUBCUTANEOUS | Status: DC
  Filled 2021-02-03 (×6): qty 0.4

## 2021-02-03 MED ORDER — LORAZEPAM 2 MG/ML IJ SOLN
1.0000 mg | INTRAMUSCULAR | Status: AC | PRN
Start: 2021-02-03 — End: 2021-02-06

## 2021-02-03 MED ORDER — FOLIC ACID 1 MG PO TABS
1.0000 mg | ORAL_TABLET | Freq: Every day | ORAL | Status: DC
  Administered 2021-02-03 – 2021-02-11 (×9): 1 mg via ORAL
  Filled 2021-02-03 (×9): qty 1

## 2021-02-03 MED ORDER — LORAZEPAM 1 MG PO TABS
1.0000 mg | ORAL_TABLET | ORAL | Status: AC | PRN
Start: 2021-02-03 — End: 2021-02-06
  Administered 2021-02-03: 1 mg via ORAL
  Filled 2021-02-03: qty 2

## 2021-02-03 NOTE — Progress Notes (Signed)
Per pt legs have been tingling/burning pain of 8 for 4 weeks. Not taking anything OP for pain relief. Pt stated morphine did not help his pain. MD notified. Gabapentin, IVIG and ativan per CIWA scale given. Pt resting comfortably.

## 2021-02-03 NOTE — Progress Notes (Signed)
Patient alert and oriented, heart rate is elevated. Yellow MEWS protocol initiated, continue to monitor.  02/03/21 1749  Assess: MEWS Score  Temp 99 F (37.2 C)  BP 117/78  Pulse Rate (!) 124  Resp 20  Level of Consciousness Alert  Assess: MEWS Score  MEWS Temp 0  MEWS Systolic 0  MEWS Pulse 2  MEWS RR 0  MEWS LOC 0  MEWS Score 2  MEWS Score Color Yellow  Assess: if the MEWS score is Yellow or Red  Were vital signs taken at a resting state? Yes  Focused Assessment Change from prior assessment (see assessment flowsheet)  Early Detection of Sepsis Score *See Row Information* Low  MEWS guidelines implemented *See Row Information* Yes  Treat  MEWS Interventions Administered scheduled meds/treatments  Pain Scale 0-10  Pain Score 8  Pain Type Chronic pain  Pain Location Leg  Pain Orientation Left;Right  Pain Descriptors / Indicators Aching;Tingling  Pain Frequency Constant  Pain Onset On-going  Pain Intervention(s) Medication (See eMAR)  Complains of Nausea /  Vomiting  Nausea relieved by Antiemetic  Take Vital Signs  Increase Vital Sign Frequency  Yellow: Q 2hr X 2 then Q 4hr X 2, if remains yellow, continue Q 4hrs  Escalate  MEWS: Escalate Yellow: discuss with charge nurse/RN and consider discussing with provider and RRT  Notify: Charge Nurse/RN  Name of Charge Nurse/RN Notified Dana. RN  Date Charge Nurse/RN Notified 02/03/21  Time Charge Nurse/RN Notified 1749
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
## 2021-02-03 NOTE — ED Triage Notes (Signed)
Emergency Medicine Provider Triage Evaluation Note  Travis Mcgee , a 40 y.o. male  with history of HIV was evaluated in triage.  Pt complains of NBNB emesis x 4 days. Admits 10+ episodes in the last 24 hours. No meds prior to arrival. Also endorsing ascending bilateral leg numbness x 10 day, has history of the same and was admitted for [facility] Syndrome vs neurosyphilis. Has been out of all medications x 2 weeks  Review of Systems  Positive: Emesis, weight loss, numbness Negative: Abdominal pain, fever, chills, urianry symptoms, diarrhea  Physical Exam  BP (!) 118/91 (BP Location: Left Arm)   Pulse (!) 118   Temp 98.3 F (36.8 C) (Oral)   Resp 18   SpO2 100%  Gen:   Awake, no distress , thin HEENT:  Atraumatic  Resp:  Normal effort  Cardiac:  tachycardic Abd:   Nondistended, nontender MSK:   Decreased sensation in bilateral lower extremities Neuro:  Speech clear   Medical Decision Making  Medically screening exam initiated at 10:33 AM.  Appropriate orders placed.  Travis Mcgee was informed that the remainder of the evaluation will be completed by another provider, this initial triage assessment does not replace that evaluation, and the importance of remaining in the ED until their evaluation is complete.  Clinical Impression  Needs labs and additional work up.   Shanon Ace, PA-C 02/03/21 1041

## 2021-02-03 NOTE — Progress Notes (Signed)
COVID result came back positive, patient notified and transferred to [id number].
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
## 2021-02-03 NOTE — ED Triage Notes (Signed)
Pt arrived via EMS, called for n/v and issues with leg mvmt x5 days. Hx of nerve disease.

## 2021-02-04 DIAGNOSIS — R29898 Other symptoms and signs involving the musculoskeletal system: Secondary | ICD-10-CM

## 2021-02-04 DIAGNOSIS — Z8619 Personal history of other infectious and parasitic diseases: Secondary | ICD-10-CM

## 2021-02-04 DIAGNOSIS — G610 Guillain-Barre syndrome: Secondary | ICD-10-CM | POA: Diagnosis not present

## 2021-02-04 DIAGNOSIS — B20 Human immunodeficiency virus [HIV] disease: Secondary | ICD-10-CM | POA: Diagnosis not present

## 2021-02-04 DIAGNOSIS — F1010 Alcohol abuse, uncomplicated: Secondary | ICD-10-CM | POA: Diagnosis not present

## 2021-02-04 DIAGNOSIS — R7989 Other specified abnormal findings of blood chemistry: Secondary | ICD-10-CM

## 2021-02-04 DIAGNOSIS — R7401 Elevation of levels of liver transaminase levels: Secondary | ICD-10-CM

## 2021-02-04 DIAGNOSIS — D72819 Decreased white blood cell count, unspecified: Secondary | ICD-10-CM

## 2021-02-04 DIAGNOSIS — R112 Nausea with vomiting, unspecified: Secondary | ICD-10-CM

## 2021-02-04 LAB — COMPREHENSIVE METABOLIC PANEL
ALT: 80 U/L — ABNORMAL HIGH (ref 0–44)
AST: 154 U/L — ABNORMAL HIGH (ref 15–41)
Albumin: 2 g/dL — ABNORMAL LOW (ref 3.5–5.0)
Alkaline Phosphatase: 370 U/L — ABNORMAL HIGH (ref 38–126)
Anion gap: 8 (ref 5–15)
BUN: <5 mg/dL — ABNORMAL LOW (ref 6–20)
CO2: 28 mmol/L (ref 22–32)
Calcium: 7.8 mg/dL — ABNORMAL LOW (ref 8.9–10.3)
Chloride: 94 mmol/L — ABNORMAL LOW (ref 98–111)
Creatinine, Ser: 0.62 mg/dL (ref 0.61–1.24)
GFR, Estimated: >60 mL/min (ref >60)
Glucose, Bld: 86 mg/dL (ref 70–99)
Potassium: 3.5 mmol/L (ref 3.5–5.1)
Sodium: 130 mmol/L — ABNORMAL LOW (ref 135–145)
Total Bilirubin: 1.4 mg/dL — ABNORMAL HIGH (ref 0.3–1.2)
Total Protein: 7.6 g/dL (ref 6.5–8.1)

## 2021-02-04 LAB — CBC
HCT: 34.8 % — ABNORMAL LOW (ref 39.0–52.0)
Hemoglobin: 11.5 g/dL — ABNORMAL LOW (ref 13.0–17.0)
MCH: 33.9 pg (ref 26.0–34.0)
MCHC: 33 g/dL (ref 30.0–36.0)
MCV: 102.7 fL — ABNORMAL HIGH (ref 80.0–100.0)
Platelets: 146 10*3/uL — ABNORMAL LOW (ref 150–400)
RBC: 3.39 MIL/uL — ABNORMAL LOW (ref 4.22–5.81)
RDW: 18.7 % — ABNORMAL HIGH (ref 11.5–15.5)
WBC: 2.9 10*3/uL — ABNORMAL LOW (ref 4.0–10.5)
nRBC: 0 % (ref 0.0–0.2)

## 2021-02-04 LAB — HEPATITIS B SURFACE ANTIGEN: Hepatitis B Surface Ag: NONREACTIVE

## 2021-02-04 LAB — LACTATE DEHYDROGENASE: LDH: 211 U/L — ABNORMAL HIGH (ref 98–192)

## 2021-02-04 LAB — CRYPTOCOCCAL ANTIGEN: Crypto Ag: NEGATIVE

## 2021-02-04 LAB — T-HELPER CELLS (CD4) COUNT (NOT AT ARMC)
CD4 % Helper T Cell: 5 % — ABNORMAL LOW (ref 33–65)
CD4 T Cell Abs: 51 /uL — ABNORMAL LOW (ref 400–1790)

## 2021-02-04 LAB — MAGNESIUM: Magnesium: 1.5 mg/dL — ABNORMAL LOW (ref 1.7–2.4)

## 2021-02-04 LAB — PHOSPHORUS: Phosphorus: 4 mg/dL (ref 2.5–4.6)

## 2021-02-04 MED ORDER — PROSOURCE PLUS PO LIQD
30.0000 mL | Freq: Two times a day (BID) | ORAL | Status: DC
  Administered 2021-02-04 – 2021-02-10 (×12): 30 mL via ORAL
  Filled 2021-02-04 (×13): qty 30

## 2021-02-04 MED ORDER — FLUCONAZOLE IN SODIUM CHLORIDE 200-0.9 MG/100ML-% IV SOLN
200.0000 mg | INTRAVENOUS | Status: DC
  Administered 2021-02-04 – 2021-02-05 (×2): 200 mg via INTRAVENOUS
  Filled 2021-02-04 (×3): qty 100

## 2021-02-04 MED ORDER — ENSURE ENLIVE PO LIQD
237.0000 mL | Freq: Two times a day (BID) | ORAL | Status: DC
  Administered 2021-02-04 – 2021-02-09 (×10): 237 mL via ORAL

## 2021-02-04 MED ORDER — MAGNESIUM SULFATE 2 GM/50ML IV SOLN
2.0000 g | Freq: Once | INTRAVENOUS | Status: AC
  Administered 2021-02-04: 2 g via INTRAVENOUS
  Filled 2021-02-04: qty 50

## 2021-02-04 MED ORDER — HYDROCODONE-ACETAMINOPHEN 5-325 MG PO TABS
1.0000 | ORAL_TABLET | ORAL | Status: DC | PRN
  Administered 2021-02-04 – 2021-02-05 (×5): 1 via ORAL
  Filled 2021-02-04 (×5): qty 1

## 2021-02-04 NOTE — Significant Event (Signed)
Rapid Response Event Note   Reason for Call :  Telemetry & possible EKG changes, elevated HR, recently completed IVIG infusion, RN concerned about possible reaction to IVIG.  Initial Focused Assessment:  Patient appears alert, oriented x 4, able to answer all questions and follows commands. Patient reports ongoing numbness and tingling in bilateral lower extremities that radiates from soles of feet to hips. Patient denies shortness of breath, severe pain, or chest pain. Vital signs documented within expected range. Patient has equal bilateral lower extremity weakness and no unilateral deficits observed.   Interventions:  Reviewed chart and focused assessment completed. Reviewed signs/symptoms of IVIG reaction on "up to date" link. Bedside RN completed EKG that does look slightly different from admission EKG, but would appreciate MD review. Paged on call provider, Linton Flemings, NP and then spoke to her regarding patient in person. No new orders given, but she recommended getting ordered am labs now. Bedside RN to call phlebotomy to request this. Educated patient on reason for my assessment and answered his questions.   Plan of Care:  Advised that physician will need to review data obtained. Unsure if patient had actual reaction to IVIG infusion or if symptoms simply consistent with reason for admission.  Collaborated with on call provider, bedside nurse and unit charge nurse; all agreed that patient is stable and safe to remain on current unit. If further changes occur, they will call back.   Event Summary:   MD Notified: 0145 Call Time: 0137 Arrival Time: 0145 End Time: 0215  Lamona Curl, RN

## 2021-02-04 NOTE — Progress Notes (Addendum)
Telemetry changes noted by this nurse 2 hours after IVIG completion. Central tele called to inform as well. Pt noted to be in bigeminy/ frequent PVCs as well what would appear as a BBB at times. See tele strip and EKG in chart. Pt notes no CP or palp. VS stable.   EKG notes frequent PVCs and prolonged qtc-new from admission EKG.   Physician notified. Rapid nurse called to eval. Lab called to obtain AM labs early-including mag and phos.  Will continue to monitor.
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
## 2021-02-04 NOTE — Plan of Care (Signed)

## 2021-02-04 NOTE — Progress Notes (Signed)
Initial Nutrition Assessment  DOCUMENTATION CODES:   Not applicable  INTERVENTION:  - will order Ensure Enlive BID, each supplement provides 350 kcal and 20 grams of protein. - will order 30 ml Prosource Plus BID, each supplement provides 100 kcal and 15 grams protein.  - will order 1 tablet multivitamin with minerals/day. - complete NFPE when feasible.    NUTRITION DIAGNOSIS:   Increased nutrient needs related to acute illness,catabolic illness as evidenced by estimated needs.  GOAL:   Patient will meet greater than or equal to 90% of their needs  MONITOR:   PO intake,Supplement acceptance,Labs,Weight trends  REASON FOR ASSESSMENT:   Malnutrition Screening Tool  ASSESSMENT:   40 year old male with a medical history of homelessness, HIV/AIDS, Guillain Barr syndrome, and latent syphilis. He presented to the ED d/t vomiting x2-4 days, BLE numbness x2-3 weeks with associated fall. Patient has not taken his medications x4 weeks. Patient reports drinking two 24 oz beers on a regular basis.  Diet advanced from CLD to Soft today at 0955. No intakes documented since admission. Unable to talk with patient at this time.   Weight yesterday was 141 lb and weight on 08/20/20 was 156 lb. This indicates 15 lb weight loss (9.6% body weight) in the past 5.5 months.  Per notes: - intractable N/V - BLE weakness, pain, and paresthesia with concern for recurrent Guillain-Barre syndrome - HIV/AIDS off HAART for 4 weeks - COVID-19 infection   Labs reviewed; Na: 130 mmol/l, Cl: 94 mmol/, BUN: <5 mg/dl, Ca: 7.8 mg/dl, Alk Phos elevated, LFTs elevated. Medications reviewed; 1 mg folvite/day, 25 mcg oral synthroid/day, 2 g IV Mg sulfate x1 run 4/11, 100 mg oral thiamine/day.      NUTRITION - FOCUSED PHYSICAL EXAM:  unable to complete at this time.   Diet Order:   Diet Order            DIET SOFT Room service appropriate? Yes; Fluid consistency: Thin  Diet effective now                  EDUCATION NEEDS:   No education needs have been identified at this time  Skin:  Skin Assessment: Reviewed RN Assessment  Last BM:  4/11 (type 5 x1)  Height:   Ht Readings from Last 1 Encounters:  02/03/21 _0  (1.803 m)    Weight:   Wt Readings from Last 1 Encounters:  02/03/21 63.8 kg    Estimated Nutritional Needs:  Kcal:  2100-2300 kcal Protein:  105-120 grams Fluid:  >/= 2.5 L/day      Travis Matin, MS, RD, LDN, CNSC Inpatient Clinical Dietitian RD pager # available in AMION  After hours/weekend pager # available in [facility]

## 2021-02-04 NOTE — Progress Notes (Addendum)
PROGRESS NOTE    Travis Mcgee  WYO:[id number] DOB: January 05, 1981 DOA: 02/03/2021 PCP: Patient, No Pcp Per (Inactive)   Brief Narrative:  This is a 40 year old male with a past medical history of homelessness, HIV and AIDS, and Guillain Barr syndrome, latent syphilis who presented to the ED with complaints of nonbilious nonbloody emesis x2-4 days.  Also admits to acsending bilateral leg numbness 2-3 weeks. He has been having significant bilateral lower extremity weakness and a fall the other day with associated paresthesias and pain from his legs up to his abdomen. He was admitted in October 2021 for concern of GBS versus neurosyphilis and was treated with IVIG and IV penicillin and also had an I&D for a left axillary abscess at the time.   In the ED patient found to be afebrile with mild tachycardia, without hypoxia, Alk phos 46, lipase 21, AST 182, ALT 103, T bili 1.6.  Covid incidentally positive. US-probable hepatic steatosis and otherwise unremarkable. Patient received fentanyl, Zofran, 1 L LR bolus. ED provider consulted with ID who recommended admission and GI consult.   Assessment & Plan:   Active Problems:   AIDS (acquired immune deficiency syndrome) (H[facility])   HIV (human immunodeficiency virus infection) ([place])   Guillain Barr syndrome (HCC)   Leg weakness, bilateral   Nausea & vomiting   Elevated LFTs   Alcohol abuse  Bilateral lower extremity weakness, pain, and paresthesias concerning for recurrent Guillain-Barr syndrome versus atypical neuropathy, POA - Questionably provoked by COVID  - IVIG x5 days - Guidelines recommend against steroids - Continue home gabapentin 900 mg 3 times daily - Avoiding narcotics - Discussed with neurology - Leonel Ramsay is going to consult, appreciate insight and recommendations. - B1 level pending  Nausea and vomiting of unclear etiology - LFTs elevated with hepatic steatosis on [place] -appear to be somewhat chronic - Discussed with Dr. Tommy Medal, ID,  high risk for opportunistic infection and requested GI consult -> recommending fluconazole in case of possible fungal esophagitis/thrush - Zofran as needed - Clear liquid diet and advance as tolerated  HIV with history of AIDS - Has been off his HAART therapy for the past several weeks -if not longer, declines any recent follow-up per our discussion - ID recommends holding off on restarting medications for now - Viral load pending; CD4 markedly low Lab Results  Component Value Date   HIV1RNAQUANT 450 08/15/2020   Lab Results  Component Value Date   CD4TABS 51 (L) 02/04/2021   CD4TABS 90 (L) 07/30/2020   CD4TABS 92 (L) 11/30/2019    Hypokalemia -Follow-up after fluids -Check magnesium  Alcohol abuse Advised cessation CIWA protocol ongoing   DVT prophylaxis: Lovenox Code Status: Full Family Communication: None present  Status is: Inpatient  Dispo: The patient is from: Homeless              Anticipated d/c is to: To be determined              Anticipated d/c date is: >72h              Patient currently not medically stable for discharge given ongoing need for further work-up and evaluation, IV medications as above  Consultants:   ID, neurology, GI  Procedures:   None planned  Antimicrobials:  Fluconazole initiated 02/04/2021  Subjective: Patient had rapid response overnight in the setting of worsening leg paresthesias/pain.  This morning patient denies any ongoing paresthesias and that notably he is having pain.  He also continues to report ongoing  weakness mostly in the lower extremities at the ankles and feet.  Otherwise denies chest pain shortness of breath nausea vomiting diarrhea constipation headache fevers chills incontinence of bowel or bladder over the past 24 hours.  Objective: Vitals:   02/04/21 0016 02/04/21 0145 02/04/21 0157 02/04/21 0525  BP: 116/83 106/75 108/84 112/82  Pulse: 80  95 84  Resp: 18   19  Temp: 98.7 F (37.1 C) 99 F (37.2 C)  99.2 F (37.3 C) 99.3 F (37.4 C)  TempSrc: Oral Oral Oral Oral  SpO2: 100%  100% 100%  Weight:      Height:        Intake/Output Summary (Last 24 hours) at 02/04/2021 0718 Last data filed at 02/04/2021 0500 Gross per 24 hour  Intake 2689.29 ml  Output 500 ml  Net 2189.29 ml   Filed Weights   02/03/21 1749  Weight: 63.8 kg    Examination:  General:  Pleasantly resting in bed, No acute distress. HEENT:  Normocephalic atraumatic.  Sclerae nonicteric, noninjected.  Extraocular movements intact bilaterally. Neck:  Without mass or deformity.  Trachea is midline. Lungs:  Clear to auscultate bilaterally without rhonchi, wheeze, or rales. Heart:  Regular rate and rhythm.  Without murmurs, rubs, or gallops. Abdomen:  Soft, nontender, nondistended.  Without guarding or rebound. Extremities: Without cyanosis, clubbing, edema, or obvious deformity.  Bilateral upper extremity strength 5/5; lower extremity strength approximately 5 out of 5, distally at the foot and ankle exam limited by patient's pain, unwilling to interact with strength testing. Vascular:  Dorsalis pedis and posterior tibial pulses palpable bilaterally. Skin:  Warm and dry, no erythema  Data Reviewed: I have personally reviewed following labs and imaging studies  CBC: Recent Labs  Lab 02/03/21 1036 02/04/21 0226  WBC 3.6* 2.9*  NEUTROABS 2.4  --   HGB 13.5 11.5*  HCT 40.7 34.8*  MCV 101.0* 102.7*  PLT 179 242*   Basic Metabolic Panel: Recent Labs  Lab 02/03/21 1036 02/04/21 0226  NA 133* 130*  K 3.2* 3.5  CL 93* 94*  CO2 26 28  GLUCOSE 100* 86  BUN <5* <5*  CREATININE 0.67 0.62  CALCIUM 8.3* 7.8*  MG  --  1.5*  PHOS  --  4.0   GFR: Estimated Creatinine Clearance: 109.7 mL/min (by C-G formula based on SCr of 0.62 mg/dL). Liver Function Tests: Recent Labs  Lab 02/03/21 1036 02/04/21 0226  AST 192* 154*  ALT 103* 80*  ALKPHOS 486* 370*  BILITOT 1.6* 1.4*  PROT 8.8* 7.6  ALBUMIN 2.5* 2.0*    Recent Labs  Lab 02/03/21 1036  LIPASE 21   No results for input(s): AMMONIA in the last 168 hours. Coagulation Profile: No results for input(s): INR, PROTIME in the last 168 hours. Cardiac Enzymes: No results for input(s): CKTOTAL, CKMB, CKMBINDEX, TROPONINI in the last 168 hours. BNP (last 3 results) No results for input(s): PROBNP in the last 8760 hours. HbA1C: No results for input(s): HGBA1C in the last 72 hours. CBG: No results for input(s): GLUCAP in the last 168 hours. Lipid Profile: No results for input(s): CHOL, HDL, LDLCALC, TRIG, CHOLHDL, LDLDIRECT in the last 72 hours. Thyroid Function Tests: No results for input(s): TSH, T4TOTAL, FREET4, T3FREE, THYROIDAB in the last 72 hours. Anemia Panel: No results for input(s): VITAMINB12, FOLATE, FERRITIN, TIBC, IRON, RETICCTPCT in the last 72 hours. Sepsis Labs: No results for input(s): PROCALCITON, LATICACIDVEN in the last 168 hours.  Recent Results (from the past 240 hour(s))  Resp  Panel by RT-PCR (Flu A&B, Covid) Nasopharyngeal Swab     Status: Abnormal   Collection Time: 02/03/21  3:40 PM   Specimen: Nasopharyngeal Swab; Nasopharyngeal(NP) swabs in vial transport medium  Result Value Ref Range Status   SARS Coronavirus 2 by RT PCR POSITIVE (A) NEGATIVE Final    Comment: CRITICAL RESULT CALLED TO, READ BACK BY AND VERIFIED WITH: AMELIA WOART RN 02/03/21 _0  BY P.HENDERSON    Influenza A by PCR NEGATIVE NEGATIVE Final   Influenza B by PCR NEGATIVE NEGATIVE Final    Comment: (NOTE) The Xpert Xpress SARS-CoV-2/FLU/RSV plus assay is intended as an aid in the diagnosis of influenza from Nasopharyngeal swab specimens and should not be used as a sole basis for treatment. Nasal washings and aspirates are unacceptable for Xpert Xpress SARS-CoV-2/FLU/RSV testing.  Fact Sheet for Patients: EntrepreneurPulse.com.au  Fact Sheet for Healthcare Providers: IncredibleEmployment.be  This  test is not yet approved or cleared by the [place] FDA and has been authorized for detection and/or diagnosis of SARS-CoV-2 by FDA under an Emergency Use Authorization (EUA). This EUA will remain in effect (meaning this test can be used) for the duration of the COVID-19 declaration under Section 564(b)(1) of the Act, 21 U.S.C. section 360bbb-3(b)(1), unless the authorization is terminated or revoked.  Performed at [facility] [place] [address] [place], [place] [address]      Radiology Studies: US Abdomen Limited  Result Date: 02/03/2021 CLINICAL DATA:  Elevated LFTs. EXAM: ULTRASOUND ABDOMEN LIMITED RIGHT UPPER QUADRANT COMPARISON:  No comparison studies available. FINDINGS: Gallbladder: No gallstones or wall thickening visualized. No sonographic Murphy sign noted by sonographer. Common bile duct: Diameter: 5 mm Liver: Mild coarsening of the hepatic echotexture suggests fatty deposition. Portal vein is patent on color Doppler imaging with normal direction of blood flow towards the liver. Other: None. IMPRESSION: Probable hepatic steatosis.  Otherwise unremarkable. Electronically Signed   By: Misty Stanley M.D.   On: 02/03/2021 14:53   Scheduled Meds: . enoxaparin (LOVENOX) injection  40 mg Subcutaneous Q24H  . folic acid  1 mg Oral Daily  . gabapentin  900 mg Oral TID  . levothyroxine  25 mcg Oral Q0600  . multivitamin with minerals  1 tablet Oral Daily  . thiamine  100 mg Oral Daily   Or  . thiamine  100 mg Intravenous Daily   Continuous Infusions: . Immune Globulin 10% Stopped (02/03/21 2324)    LOS: 1 day   Time spent: 16mn  Jaszmine Navejas C Lillian Ballester, DO [facility]  If 7PM-7AM, please contact night-coverage [email or web address]  02/04/2021, 7:18 AM
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
## 2021-02-05 ENCOUNTER — Ambulatory Visit (HOSPITAL_COMMUNITY)
Admit: 2021-02-05 | Discharge: 2021-02-05 | Disposition: A | Discharge status: Home / Self Care | Payer: Medicaid Other | Attending: Neurology | Admitting: Neurology

## 2021-02-05 ENCOUNTER — Other Ambulatory Visit (HOSPITAL_COMMUNITY): Payer: Self-pay

## 2021-02-05 DIAGNOSIS — R7989 Other specified abnormal findings of blood chemistry: Secondary | ICD-10-CM | POA: Diagnosis not present

## 2021-02-05 DIAGNOSIS — B20 Human immunodeficiency virus [HIV] disease: Secondary | ICD-10-CM | POA: Diagnosis not present

## 2021-02-05 DIAGNOSIS — F1010 Alcohol abuse, uncomplicated: Secondary | ICD-10-CM | POA: Diagnosis not present

## 2021-02-05 DIAGNOSIS — G610 Guillain-Barre syndrome: Secondary | ICD-10-CM | POA: Diagnosis not present

## 2021-02-05 LAB — COMPREHENSIVE METABOLIC PANEL
ALT: 72 U/L — ABNORMAL HIGH (ref 0–44)
AST: 133 U/L — ABNORMAL HIGH (ref 15–41)
Albumin: 2 g/dL — ABNORMAL LOW (ref 3.5–5.0)
Alkaline Phosphatase: 309 U/L — ABNORMAL HIGH (ref 38–126)
Anion gap: 7 (ref 5–15)
BUN: 6 mg/dL (ref 6–20)
CO2: 24 mmol/L (ref 22–32)
Calcium: 7.7 mg/dL — ABNORMAL LOW (ref 8.9–10.3)
Chloride: 97 mmol/L — ABNORMAL LOW (ref 98–111)
Creatinine, Ser: 0.56 mg/dL — ABNORMAL LOW (ref 0.61–1.24)
GFR, Estimated: >60 mL/min (ref >60)
Glucose, Bld: 99 mg/dL (ref 70–99)
Potassium: 3.6 mmol/L (ref 3.5–5.1)
Sodium: 128 mmol/L — ABNORMAL LOW (ref 135–145)
Total Bilirubin: 0.7 mg/dL (ref 0.3–1.2)
Total Protein: 7.6 g/dL (ref 6.5–8.1)

## 2021-02-05 LAB — ACID FAST SMEAR (AFB, MYCOBACTERIA)

## 2021-02-05 LAB — CBC
HCT: 32.5 % — ABNORMAL LOW (ref 39.0–52.0)
Hemoglobin: 10.5 g/dL — ABNORMAL LOW (ref 13.0–17.0)
MCH: 34 pg (ref 26.0–34.0)
MCHC: 32.3 g/dL (ref 30.0–36.0)
MCV: 105.2 fL — ABNORMAL HIGH (ref 80.0–100.0)
Platelets: 154 10*3/uL (ref 150–400)
RBC: 3.09 MIL/uL — ABNORMAL LOW (ref 4.22–5.81)
RDW: 18.2 % — ABNORMAL HIGH (ref 11.5–15.5)
WBC: 4 10*3/uL (ref 4.0–10.5)
nRBC: 0 % (ref 0.0–0.2)

## 2021-02-05 LAB — HEPATITIS C ANTIBODY: HCV Ab: REACTIVE — AB

## 2021-02-05 LAB — RPR
RPR Ser Ql: REACTIVE — AB
RPR Titer: 1:16 {titer}

## 2021-02-05 LAB — HIV-1 RNA QUANT-NO REFLEX-BLD
HIV 1 RNA Quant: 528000 copies/mL
LOG10 HIV-1 RNA: 5.723 log10copy/mL

## 2021-02-05 LAB — VITAMIN B12: Vitamin B-12: 946 pg/mL — ABNORMAL HIGH (ref 180–914)

## 2021-02-05 LAB — HEPATITIS B CORE ANTIBODY, TOTAL: Hep B Core Total Ab: REACTIVE — AB

## 2021-02-05 LAB — HEPATITIS B SURFACE ANTIBODY, QUANTITATIVE: Hep B S AB Quant (Post): 170.9 m[IU]/mL (ref >9.9)

## 2021-02-05 IMAGING — MR MR THORACIC SPINE WO/W CM
8 of 9 series · 36 of 48 positions shown · non-contrast
Comparison: None.

Previous MRI from [DATE]

CLINICAL DATA: Initial evaluation for cervical radiculopathy.
History of HIV, syphilis, Guillain-Barre syndrome, leg weakness.

EXAM:
MRI CERVICAL AND THORACIC SPINE WITHOUT CONTRAST
TECHNIQUE: Multiplanar and multiecho pulse sequences of the cervical spine, to
include the craniocervical junction and cervicothoracic junction,
and the thoracic spine, were obtained without intravenous contrast.

[Series 27: T1 · sagittal · 4.0mm · 1.72mm/px · 1 of 9 slices shown (1 of 3)]
[im 1/9]
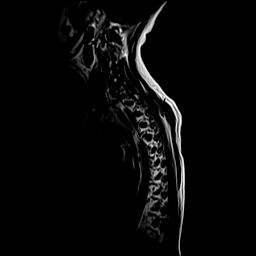

[Series 28: STIR · sagittal · 3.0mm · 1.25mm/px · 4 of 15 slices shown]
[im 1/15]
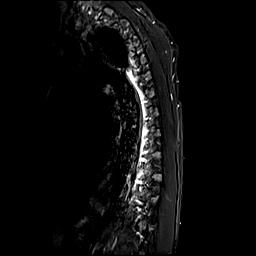
[im 5/15]
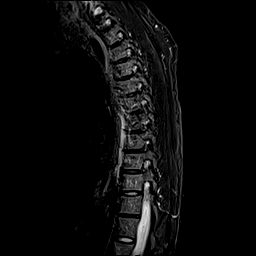
[im 10/15]
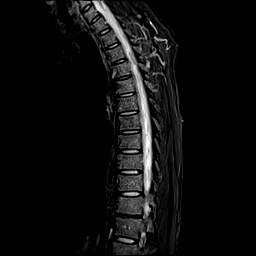
[im 15/15]
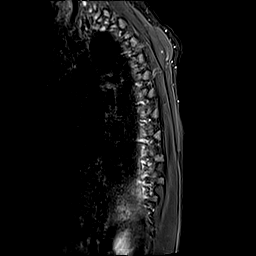

[Series 29: T1 · sagittal · 3.0mm · 1.25mm/px · 4 of 15 slices shown (2 of 3)]
[im 1/15]
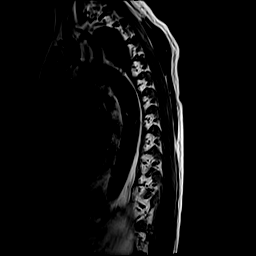
[im 5/15]
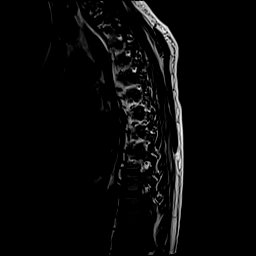
[im 10/15]
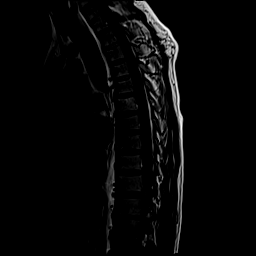
[im 15/15]
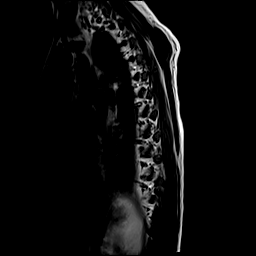

[Series 30: T2 · axial · 4.0mm · 0.78mm/px · z∈[-296,-83]mm · 9 of 36 slices shown]
[im 1/36]
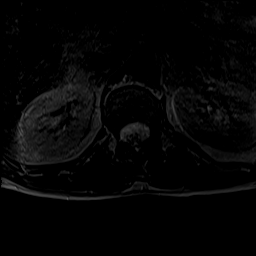
[im 5/36]
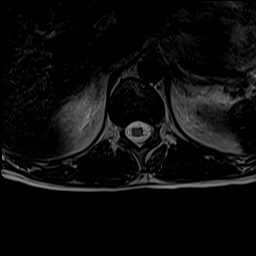
[im 9/36]
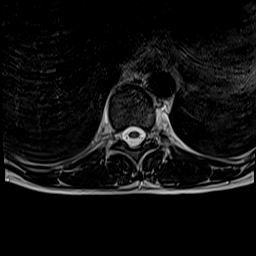
[im 14/36]
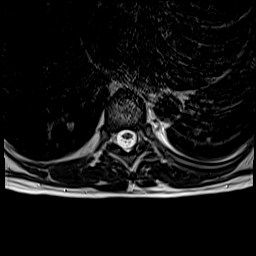
[im 18/36]
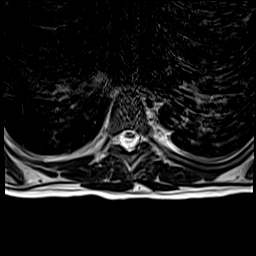
[im 22/36]
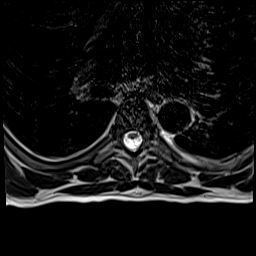
[im 27/36]
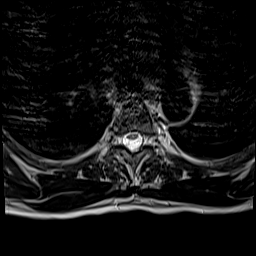
[im 31/36]
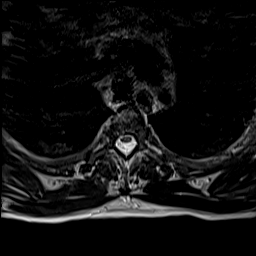
[im 36/36]
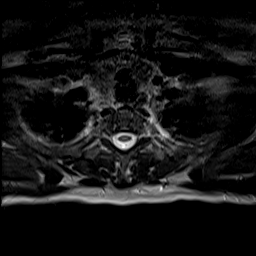

[Series 32: T1 · axial · 4.0mm · 0.39mm/px · z∈[-297,-79]mm · 8 of 36 slices shown (3 of 3)]
[im 1/36]
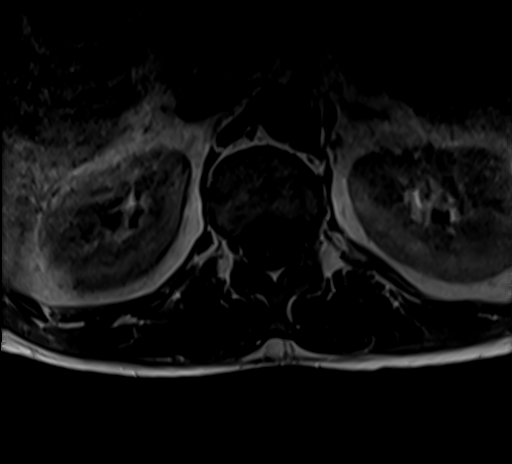
[im 5/36]
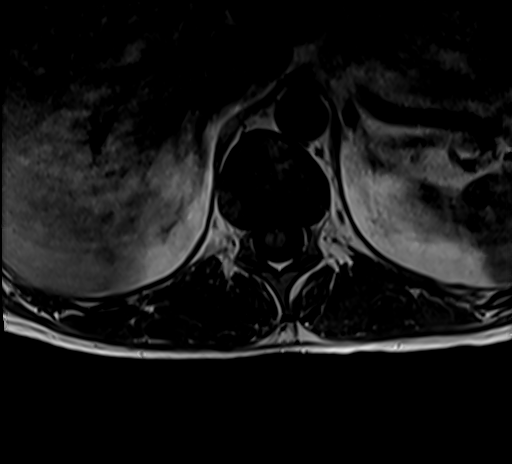
[im 9/36]
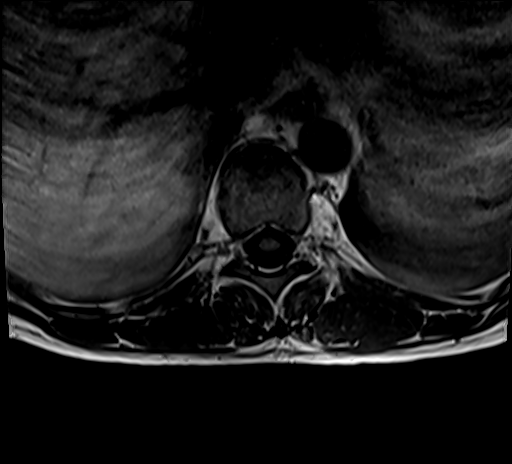
[im 14/36]
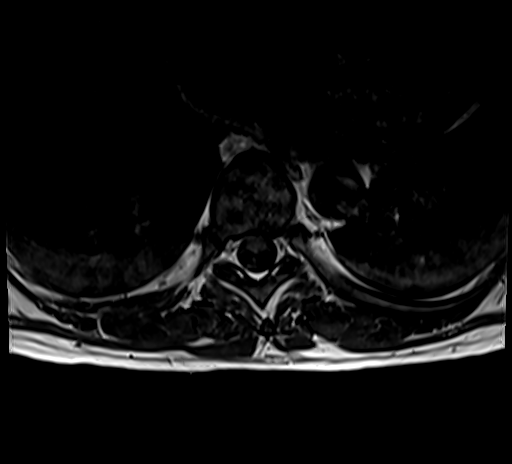
[im 22/36]
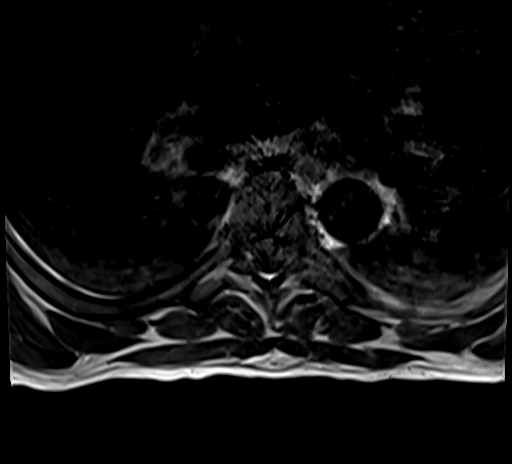
[im 27/36]
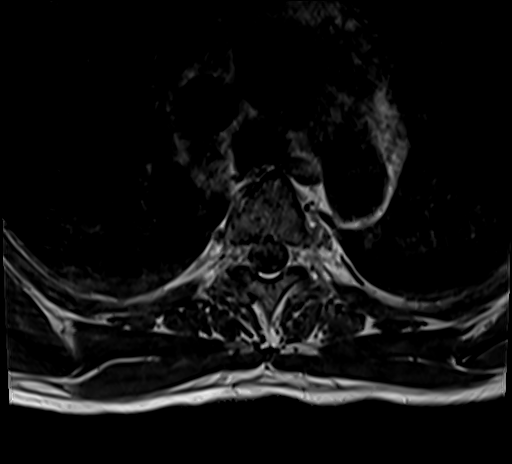
[im 31/36]
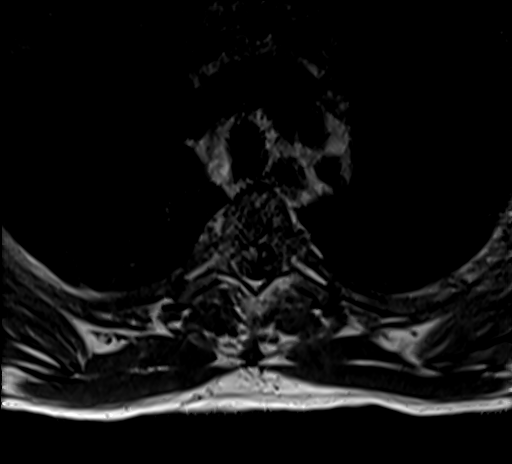
[im 36/36]
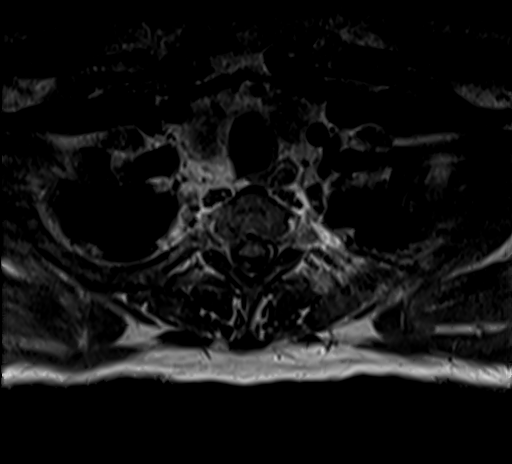

[Series 33: T2 post-contrast · sagittal · 3.0mm · 1.00mm/px · 4 of 15 slices shown]
[im 1/15]
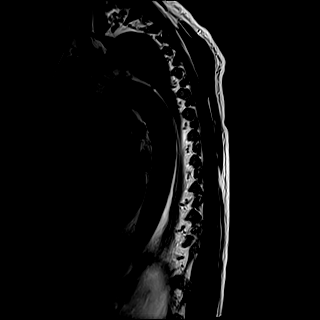
[im 5/15]
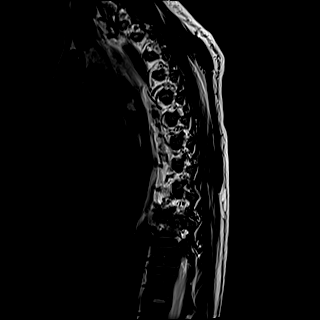
[im 10/15]
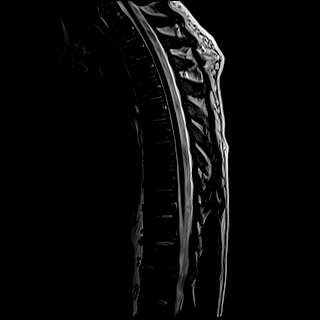
[im 15/15]
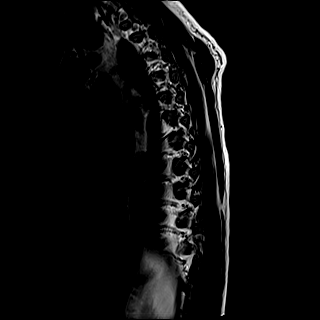

[Series 34: T1 fat-sat post-contrast · sagittal · 3.0mm · 1.25mm/px · 4 of 15 slices shown]
[im 1/15]
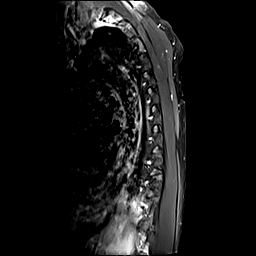
[im 5/15]
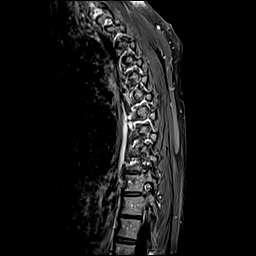
[im 10/15]
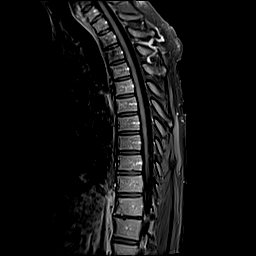
[im 15/15]
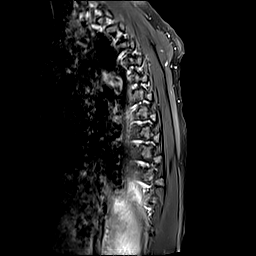

[Series 35: T1 post-contrast · axial · 4.0mm · 0.39mm/px · z∈[-297,-267]mm · 2 of 36 slices shown]
[im 1/36]
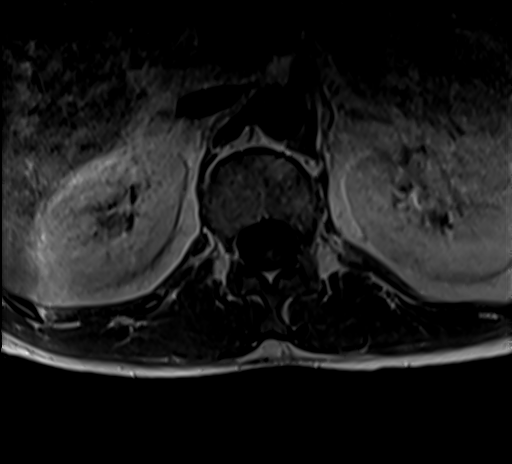
[im 5/36]
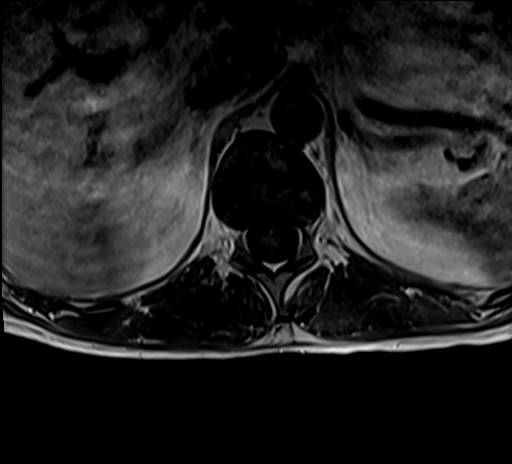

[36 of 48 positions shown; findings below may reference images not displayed]

FINDINGS: MRI CERVICAL SPINE FINDINGS

Alignment: Mild straightening of the normal cervical lordosis. Trace
physiologic anterolisthesis of C7 on T1.

Vertebrae: Vertebral body height maintained without acute or chronic
fracture. Bone marrow signal intensity diffusely decreased on T1
weighted imaging, nonspecific, but most commonly related to anemia,
smoking, or obesity. No discrete or worrisome osseous lesions. No
abnormal marrow edema or enhancement.

Cord: Signal intensity within the cervical spinal cord is normal. A
degree of underlying diffuse cord atrophy noted, similar to
previous. No abnormal enhancement. No epidural collections.

Posterior Fossa, vertebral arteries, paraspinal tissues: Visualized
brain and posterior fossa within normal limits. Craniocervical
junction normal. Paraspinous and prevertebral soft tissues within
normal limits. Normal intravascular flow voids seen within the
vertebral arteries bilaterally.

Disc levels:

C2-C3: Unremarkable.

C3-C4:  Unremarkable.

C4-C5: Mild bilateral uncovertebral hypertrophy without significant
disc bulge. No spinal stenosis. Foramina remain patent.

C5-C6: Mild right-sided uncovertebral hypertrophy without
significant disc bulge. No spinal stenosis. Mild right C6 foraminal
narrowing. Left neural foramina remains patent.

C6-C7: Mild right-sided uncovertebral hypertrophy without
significant disc bulge. No spinal stenosis. Mild right C7 foraminal
stenosis. Left neural foramina remains patent.

C7-T1:  Unremarkable.

MRI THORACIC SPINE FINDINGS

Alignment: Trace scoliosis. Alignment otherwise normal with
preservation of the normal thoracic kyphosis. No listhesis.

Vertebrae: Vertebral body height maintained without acute or chronic
fracture. Bone marrow signal intensity diffusely decreased on T1
weighted imaging, nonspecific, but most commonly related to anemia,
smoking, or obesity. No discrete or worrisome osseous lesions. No
abnormal marrow edema or enhancement.

Cord: Signal intensity within the thoracic spinal cord is within
normal limits. Degree of underlying diffuse cord atrophy noted. No
abnormal enhancement. No epidural collections or other abnormality.

Paraspinal and other soft tissues: Paraspinous soft tissues within
normal limits. Trace layering bilateral pleural effusions noted.
Visualized visceral structures otherwise grossly unremarkable.

Disc levels:

T11-12: Disc desiccation with mild discogenic reactive endplate
change. No significant disc bulge or focal disc herniation. No
stenosis.

Otherwise, no other significant disc pathology seen within the
thoracic spine. No other disc bulge or focal disc herniation. No
other stenosis or neural impingement.
IMPRESSION: MRI CERVICAL SPINE IMPRESSION:

1. Mild diffuse cord atrophy, similar to previous.
2. Right-sided uncovertebral hypertrophy at C5-6 and C6-7 with
resultant mild right C6 and C7 foraminal stenosis.
3. Otherwise unremarkable and normal MRI of the cervical spine.

MRI THORACIC SPINE IMPRESSION:

1. Mild diffuse cord atrophy, similar to previous.
2. Mild degenerative disc disease at T11-12 without stenosis.
Otherwise no significant disc pathology, stenosis, or evidence for
neural impingement.
3. Trace layering bilateral pleural effusions.

## 2021-02-05 IMAGING — MR MR CERVICAL SPINE WO/W CM
7 of 8 series · 34 of 48 positions shown · non-contrast
Comparison: None.

Previous MRI from [DATE]

CLINICAL DATA: Initial evaluation for cervical radiculopathy.
History of HIV, syphilis, Guillain-Barre syndrome, leg weakness.

EXAM:
MRI CERVICAL AND THORACIC SPINE WITHOUT CONTRAST
TECHNIQUE: Multiplanar and multiecho pulse sequences of the cervical spine, to
include the craniocervical junction and cervicothoracic junction,
and the thoracic spine, were obtained without intravenous contrast.

[Series 5: T1 · sagittal · 3.0mm · 0.72mm/px · 4 of 15 slices shown (1 of 2)]
[im 1/15]
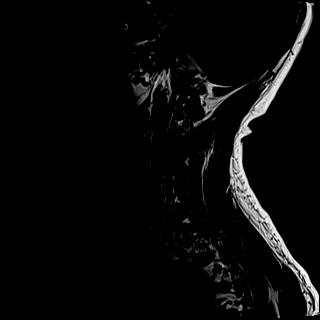
[im 5/15]
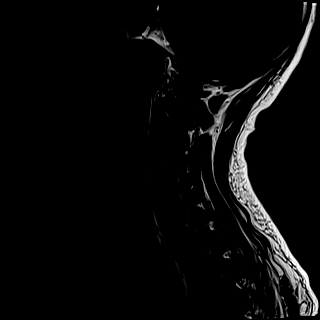
[im 10/15]
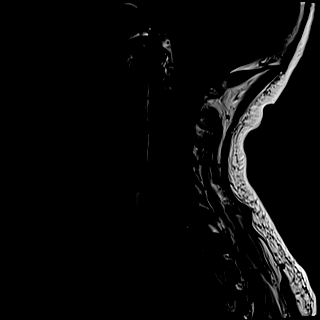
[im 15/15]
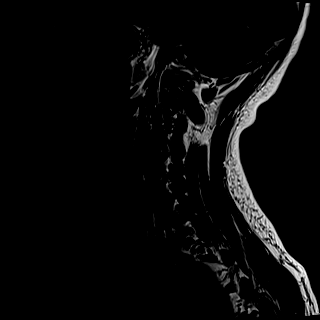

[Series 6: T2 · sagittal · 3.0mm · 0.72mm/px · 4 of 15 slices shown (1 of 2)]
[im 1/15]
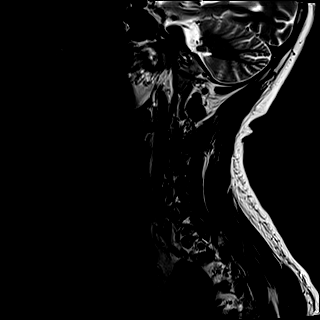
[im 5/15]
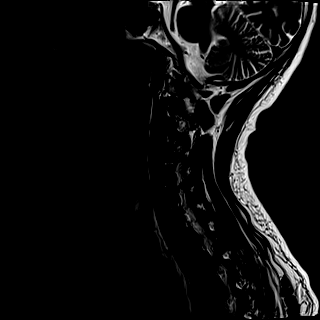
[im 10/15]
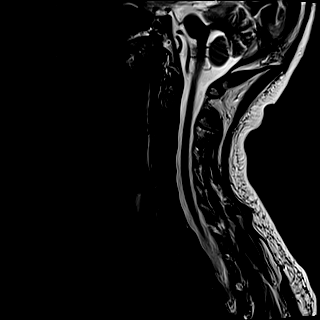
[im 15/15]
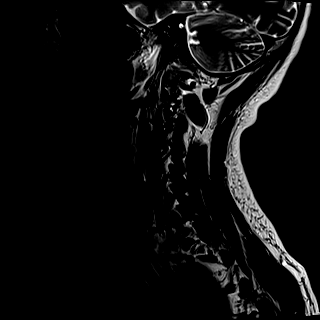

[Series 7: STIR · sagittal · 3.0mm · 0.90mm/px · 4 of 15 slices shown]
[im 1/15]
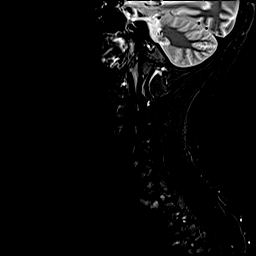
[im 5/15]
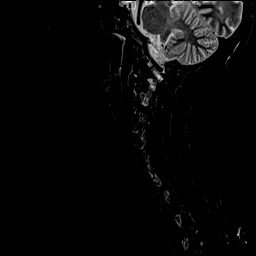
[im 10/15]
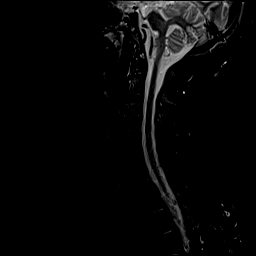
[im 15/15]
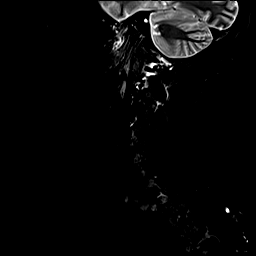

[Series 8: T2 · axial · 3.0mm · 0.74mm/px · z∈[-60,+33]mm · 8 of 29 slices shown (2 of 2)]
[im 1/29]
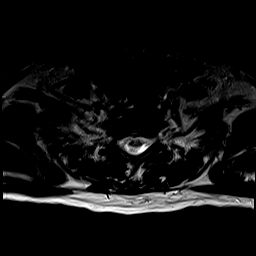
[im 5/29]
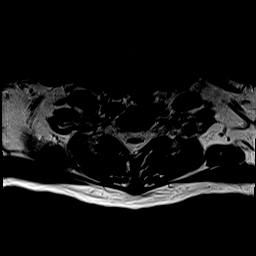
[im 9/29]
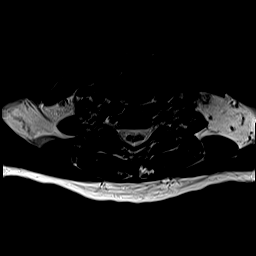
[im 13/29]
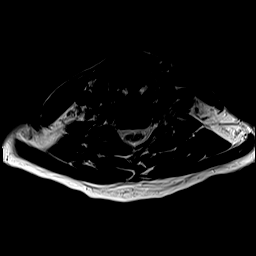
[im 17/29]
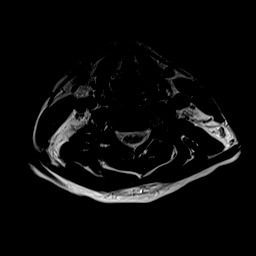
[im 21/29]
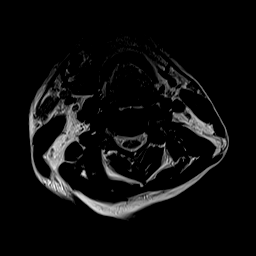
[im 25/29]
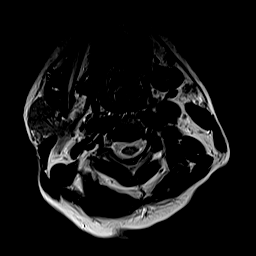
[im 29/29]
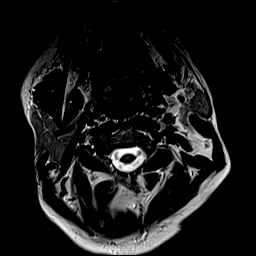

[Series 10: T1 · axial · 3.0mm · 0.35mm/px · z∈[-56,+38]mm · 8 of 29 slices shown (2 of 2)]
[im 1/29]
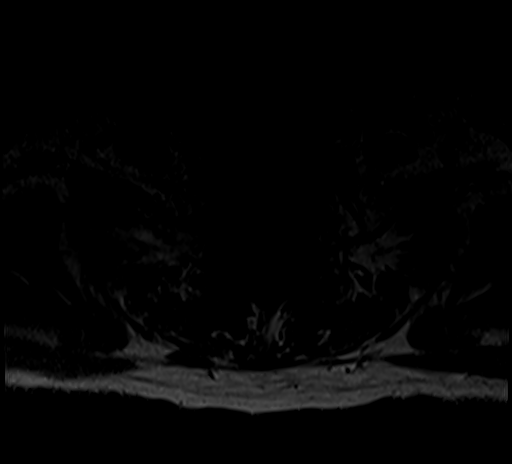
[im 5/29]
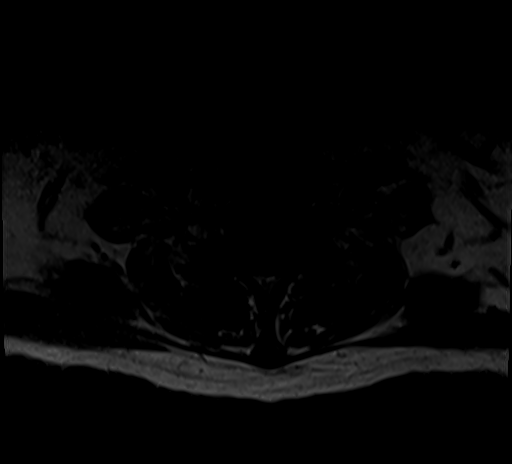
[im 9/29]
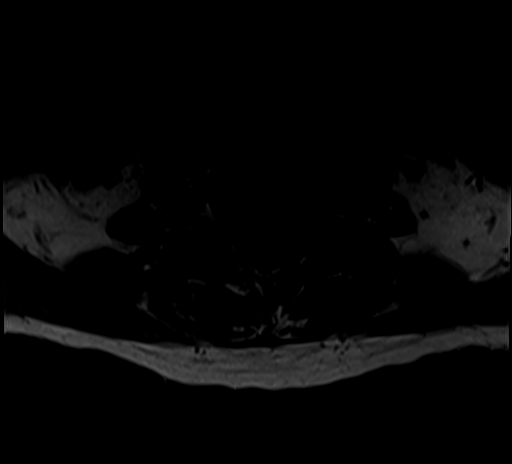
[im 13/29]
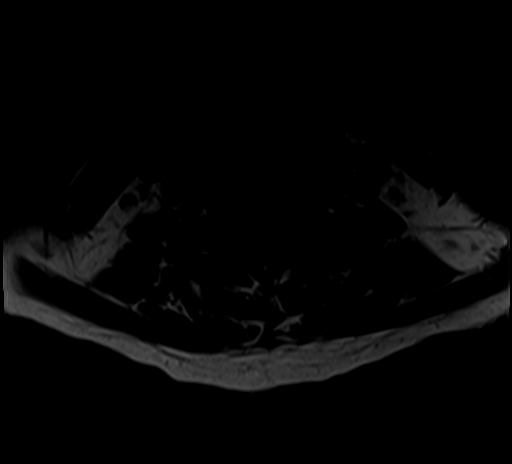
[im 17/29]
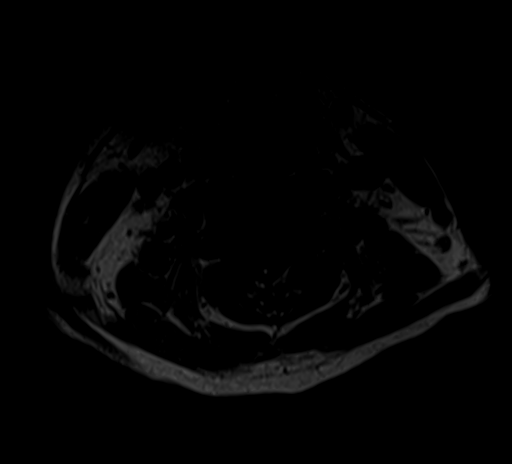
[im 21/29]
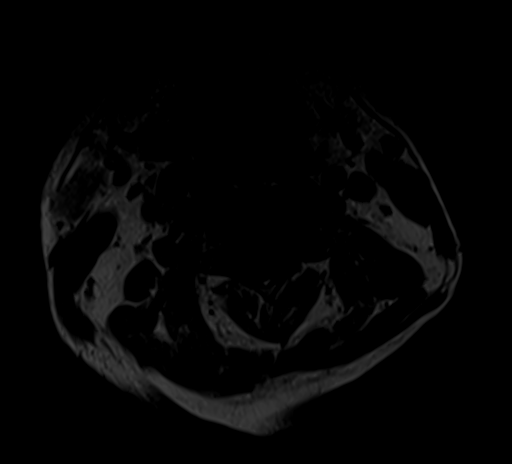
[im 25/29]
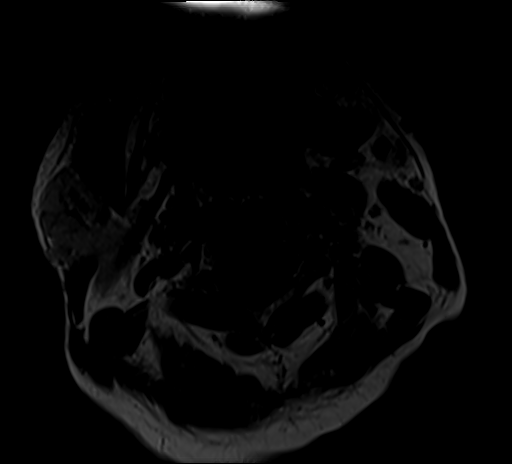
[im 29/29]
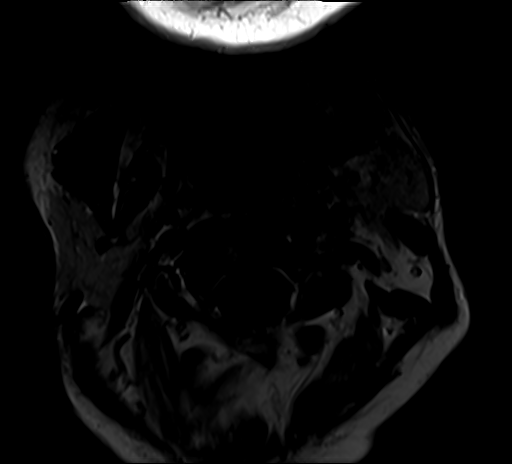

[Series 35: T1 fat-sat post-contrast · sagittal · 3.0mm · 0.86mm/px · 4 of 15 slices shown]
[im 1/15]
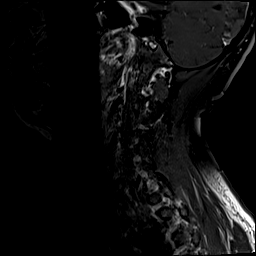
[im 5/15]
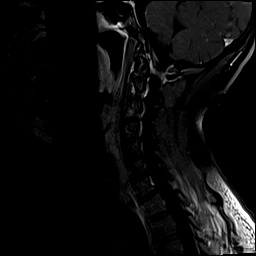
[im 10/15]
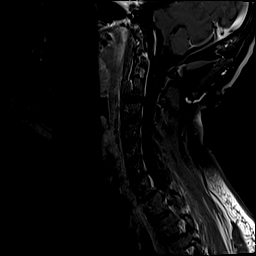
[im 15/15]
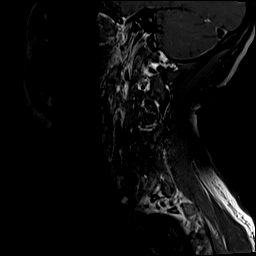

[Series 36: T1 post-contrast · axial · 3.0mm · 0.35mm/px · z∈[-56,-42]mm · 2 of 29 slices shown]
[im 1/29]
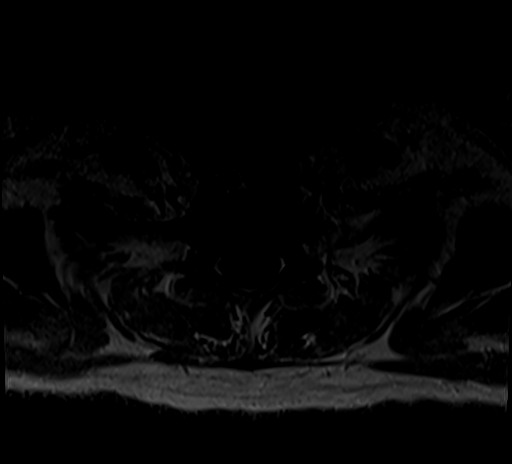
[im 5/29]
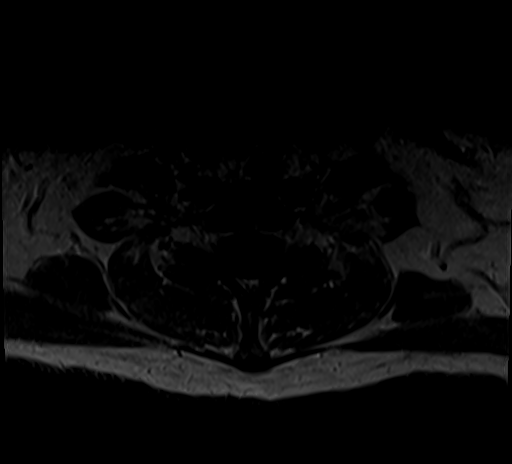

[34 of 48 positions shown; findings below may reference images not displayed]

FINDINGS: MRI CERVICAL SPINE FINDINGS

Alignment: Mild straightening of the normal cervical lordosis. Trace
physiologic anterolisthesis of C7 on T1.

Vertebrae: Vertebral body height maintained without acute or chronic
fracture. Bone marrow signal intensity diffusely decreased on T1
weighted imaging, nonspecific, but most commonly related to anemia,
smoking, or obesity. No discrete or worrisome osseous lesions. No
abnormal marrow edema or enhancement.

Cord: Signal intensity within the cervical spinal cord is normal. A
degree of underlying diffuse cord atrophy noted, similar to
previous. No abnormal enhancement. No epidural collections.

Posterior Fossa, vertebral arteries, paraspinal tissues: Visualized
brain and posterior fossa within normal limits. Craniocervical
junction normal. Paraspinous and prevertebral soft tissues within
normal limits. Normal intravascular flow voids seen within the
vertebral arteries bilaterally.

Disc levels:

C2-C3: Unremarkable.

C3-C4:  Unremarkable.

C4-C5: Mild bilateral uncovertebral hypertrophy without significant
disc bulge. No spinal stenosis. Foramina remain patent.

C5-C6: Mild right-sided uncovertebral hypertrophy without
significant disc bulge. No spinal stenosis. Mild right C6 foraminal
narrowing. Left neural foramina remains patent.

C6-C7: Mild right-sided uncovertebral hypertrophy without
significant disc bulge. No spinal stenosis. Mild right C7 foraminal
stenosis. Left neural foramina remains patent.

C7-T1:  Unremarkable.

MRI THORACIC SPINE FINDINGS

Alignment: Trace scoliosis. Alignment otherwise normal with
preservation of the normal thoracic kyphosis. No listhesis.

Vertebrae: Vertebral body height maintained without acute or chronic
fracture. Bone marrow signal intensity diffusely decreased on T1
weighted imaging, nonspecific, but most commonly related to anemia,
smoking, or obesity. No discrete or worrisome osseous lesions. No
abnormal marrow edema or enhancement.

Cord: Signal intensity within the thoracic spinal cord is within
normal limits. Degree of underlying diffuse cord atrophy noted. No
abnormal enhancement. No epidural collections or other abnormality.

Paraspinal and other soft tissues: Paraspinous soft tissues within
normal limits. Trace layering bilateral pleural effusions noted.
Visualized visceral structures otherwise grossly unremarkable.

Disc levels:

T11-12: Disc desiccation with mild discogenic reactive endplate
change. No significant disc bulge or focal disc herniation. No
stenosis.

Otherwise, no other significant disc pathology seen within the
thoracic spine. No other disc bulge or focal disc herniation. No
other stenosis or neural impingement.
IMPRESSION: MRI CERVICAL SPINE IMPRESSION:

1. Mild diffuse cord atrophy, similar to previous.
2. Right-sided uncovertebral hypertrophy at C5-6 and C6-7 with
resultant mild right C6 and C7 foraminal stenosis.
3. Otherwise unremarkable and normal MRI of the cervical spine.

MRI THORACIC SPINE IMPRESSION:

1. Mild diffuse cord atrophy, similar to previous.
2. Mild degenerative disc disease at T11-12 without stenosis.
Otherwise no significant disc pathology, stenosis, or evidence for
neural impingement.
3. Trace layering bilateral pleural effusions.

## 2021-02-05 MED ORDER — HYDROCODONE-ACETAMINOPHEN 5-325 MG PO TABS
1.0000 | ORAL_TABLET | Freq: Three times a day (TID) | ORAL | Status: DC | PRN
  Administered 2021-02-05 – 2021-02-10 (×12): 1 via ORAL
  Filled 2021-02-05 (×12): qty 1

## 2021-02-05 MED ORDER — GADOBUTROL 1 MMOL/ML IV SOLN
7.0000 mL | Freq: Once | INTRAVENOUS | Status: AC | PRN
  Administered 2021-02-05: 7 mL via INTRAVENOUS
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
## 2021-02-05 NOTE — Progress Notes (Addendum)
PROGRESS NOTE    Travis Mcgee  JJH:[id number] DOB: 04/14/1981 DOA: 02/03/2021 PCP: Patient, No Pcp Per (Inactive)   Brief Narrative:  This is a 40 year old male with a past medical history of homelessness, HIV and AIDS, and Guillain Barr syndrome, latent syphilis who presented to the ED with complaints of nonbilious nonbloody emesis x2-4 days.  Also admits to acsending bilateral leg numbness 2-3 weeks. He has been having significant bilateral lower extremity weakness and a fall the other day with associated paresthesias and pain from his legs up to his abdomen. He was admitted in October 2021 for concern of GBS versus neurosyphilis and was treated with IVIG and IV penicillin and also had an I&D for a left axillary abscess at the time.   In the ED patient found to be afebrile with mild tachycardia, without hypoxia, Alk phos 46, lipase 21, AST 182, ALT 103, T bili 1.6.  Covid incidentally positive. US-probable hepatic steatosis and otherwise unremarkable. Patient received fentanyl, Zofran, 1 L LR bolus. ED provider consulted with ID who recommended admission and GI consult.   Assessment & Plan:   Principal Problem:   HIV (human immunodeficiency virus infection) ([place]) Active Problems:   AIDS (acquired immune deficiency syndrome) ([place])   History of syphilis   Guillain Barr syndrome ([place])   Leg weakness, bilateral   Nausea & vomiting   Elevated LFTs   Alcohol abuse   Bilateral lower extremity weakness, pain, and paresthesias Unlikely Guillain-Barr syndrome  Rule out secondary causes of atypical neuropathy including but not limited to syphilis, POA -Continue IVIG x5 days -pending MRI of the spine per neurology may discontinue early and lieu of steroids or other treatment pending findings -patient may need an LP for further diagnosis and evaluation - Continue home gabapentin 900 mg 3 times daily - Avoiding narcotics - Neurology following appreciate insight and recommendations - B1 level  pending; B12 elevated but patient was repleted at intake - Patient able to ambulate with PT today, somewhat limited due to pain but was doing well with a walker -RPR positive, crypto negative  Nausea and vomiting of unclear etiology, resolved - LFTs elevated with hepatic steatosis on [place] - appear to be somewhat chronic - Discussed with Dr. Tommy Medal, ID, high risk for opportunistic infection and requested GI consult -> recommending fluconazole in case of possible fungal esophagitis/thrush - Zofran as needed -no episodes of vomiting since admission - Continue to advance diet as tolerated  HIV with history of AIDS - Has been off his HAART therapy for the past several weeks -if not longer, declines any recent follow-up per our discussion - ID recommends holding off on restarting medications for now - Viral load 450; CD4 markedly low 51 - RPR positive  Elevated AST, ALT, alk phos  -Hepatitis B core antibody, hepatitis C antibody positive -History of alcohol use/abuse -continue CIWA protocol -Liver enzymes appear to be chronically elevated -downtrending since admission  Incidentally Covid positive, no acute signs or symptoms of pneumonia  -Continue supportive care only, COVID may be related to patient's nausea or vomiting but this is resolved without intervention -Unlikely the cause of patient's neuropathy as above -Covid has caused rare episodes of Guillain-Barr syndrome however he does not appear to meet criteria for Guillain-Barr per discussion with neurology  Hyponatremia in the setting of poor p.o. intake nausea and vomiting, POA  -Continue to follow morning labs, increase diet as appropriate  Hypokalemia -Continue to advance diet, replete as appropriate  DVT prophylaxis: Lovenox Code Status:  Full Family Communication: None present  Status is: Inpatient  Dispo: The patient is from: Homeless              Anticipated d/c is to: To be determined              Anticipated d/c date  is: >72h              Patient currently not medically stable for discharge given ongoing need for further work-up and evaluation, IV medications as above  Consultants:   ID, neurology, GI  Procedures:   None planned  Antimicrobials:  Fluconazole initiated 02/04/2021  Subjective: No acute issues or events overnight, no further episodes of nausea vomiting, patient indicates his leg pain is ongoing and shooting up his legs but denies any notable paresthesias or weakness.  No focal deficits.  Otherwise denies chest pain shortness of breath headache fevers or chills..  Objective: Vitals:   02/04/21 2118 02/04/21 2214 02/04/21 2250 02/05/21 0425  BP: 103/72 123/80 115/85 (!) 134/93  Pulse: 83 89 81 80  Resp: _0 Temp: 99.2 F (37.3 C) 99.1 F (37.3 C) 99.4 F (37.4 C) 100 F (37.8 C)  TempSrc: Oral Oral Oral Oral  SpO2: 97% 99% 100% 100%  Weight:      Height:        Intake/Output Summary (Last 24 hours) at 02/05/2021 0751 Last data filed at 02/05/2021 0600 Gross per 24 hour  Intake 950 ml  Output 1700 ml  Net -750 ml   Filed Weights   02/03/21 1749  Weight: 63.8 kg    Examination:  General:  Pleasantly resting in bed, No acute distress. HEENT:  Normocephalic atraumatic.  Sclerae nonicteric, noninjected.  Extraocular movements intact bilaterally. Neck:  Without mass or deformity.  Trachea is midline. Lungs:  Clear to auscultate bilaterally without rhonchi, wheeze, or rales. Heart:  Regular rate and rhythm.  Without murmurs, rubs, or gallops. Abdomen:  Soft, nontender, nondistended.  Without guarding or rebound. Extremities: Without cyanosis, clubbing, edema, or obvious deformity.  Bilateral upper extremity strength 5/5; lower extremity strength approximately 5 out of 5, distally at the foot and ankle exam limited by patient's pain, unwilling to interact with strength testing. Vascular:  Dorsalis pedis and posterior tibial pulses palpable bilaterally. Skin:  Warm  and dry, no erythema  Data Reviewed: I have personally reviewed following labs and imaging studies  CBC: Recent Labs  Lab 02/03/21 1036 02/04/21 0226 02/05/21 0637  WBC 3.6* 2.9* 4.0  NEUTROABS 2.4  --   --   HGB 13.5 11.5* 10.5*  HCT 40.7 34.8* 32.5*  MCV 101.0* 102.7* 105.2*  PLT 179 146* 329   Basic Metabolic Panel: Recent Labs  Lab 02/03/21 1036 02/04/21 0226  NA 133* 130*  K 3.2* 3.5  CL 93* 94*  CO2 26 28  GLUCOSE 100* 86  BUN <5* <5*  CREATININE 0.67 0.62  CALCIUM 8.3* 7.8*  MG  --  1.5*  PHOS  --  4.0   GFR: Estimated Creatinine Clearance: 109.7 mL/min (by C-G formula based on SCr of 0.62 mg/dL). Liver Function Tests: Recent Labs  Lab 02/03/21 1036 02/04/21 0226  AST 192* 154*  ALT 103* 80*  ALKPHOS 486* 370*  BILITOT 1.6* 1.4*  PROT 8.8* 7.6  ALBUMIN 2.5* 2.0*   Recent Labs  Lab 02/03/21 1036  LIPASE 21   No results for input(s): AMMONIA in the last 168 hours. Coagulation Profile: No results for input(s): INR, PROTIME in  the last 168 hours. Cardiac Enzymes: No results for input(s): CKTOTAL, CKMB, CKMBINDEX, TROPONINI in the last 168 hours. BNP (last 3 results) No results for input(s): PROBNP in the last 8760 hours. HbA1C: No results for input(s): HGBA1C in the last 72 hours. CBG: No results for input(s): GLUCAP in the last 168 hours. Lipid Profile: No results for input(s): CHOL, HDL, LDLCALC, TRIG, CHOLHDL, LDLDIRECT in the last 72 hours. Thyroid Function Tests: No results for input(s): TSH, T4TOTAL, FREET4, T3FREE, THYROIDAB in the last 72 hours. Anemia Panel: No results for input(s): VITAMINB12, FOLATE, FERRITIN, TIBC, IRON, RETICCTPCT in the last 72 hours. Sepsis Labs: No results for input(s): PROCALCITON, LATICACIDVEN in the last 168 hours.  Recent Results (from the past 240 hour(s))  Resp Panel by RT-PCR (Flu A&B, Covid) Nasopharyngeal Swab     Status: Abnormal   Collection Time: 02/03/21  3:40 PM   Specimen: Nasopharyngeal  Swab; Nasopharyngeal(NP) swabs in vial transport medium  Result Value Ref Range Status   SARS Coronavirus 2 by RT PCR POSITIVE (A) NEGATIVE Final    Comment: CRITICAL RESULT CALLED TO, READ BACK BY AND VERIFIED WITH: AMELIA WOART RN 02/03/21 _0  BY P.HENDERSON    Influenza A by PCR NEGATIVE NEGATIVE Final   Influenza B by PCR NEGATIVE NEGATIVE Final    Comment: (NOTE) The Xpert Xpress SARS-CoV-2/FLU/RSV plus assay is intended as an aid in the diagnosis of influenza from Nasopharyngeal swab specimens and should not be used as a sole basis for treatment. Nasal washings and aspirates are unacceptable for Xpert Xpress SARS-CoV-2/FLU/RSV testing.  Fact Sheet for Patients: EntrepreneurPulse.com.au  Fact Sheet for Healthcare Providers: IncredibleEmployment.be  This test is not yet approved or cleared by the [place] FDA and has been authorized for detection and/or diagnosis of SARS-CoV-2 by FDA under an Emergency Use Authorization (EUA). This EUA will remain in effect (meaning this test can be used) for the duration of the COVID-19 declaration under Section 564(b)(1) of the Act, 21 U.S.C. section 360bbb-3(b)(1), unless the authorization is terminated or revoked.  Performed at [facility], [place] [address], [place], [place] [address]      Radiology Studies: US Abdomen Limited  Result Date: 02/03/2021 CLINICAL DATA:  Elevated LFTs. EXAM: ULTRASOUND ABDOMEN LIMITED RIGHT UPPER QUADRANT COMPARISON:  No comparison studies available. FINDINGS: Gallbladder: No gallstones or wall thickening visualized. No sonographic Murphy sign noted by sonographer. Common bile duct: Diameter: 5 mm Liver: Mild coarsening of the hepatic echotexture suggests fatty deposition. Portal vein is patent on color Doppler imaging with normal direction of blood flow towards the liver. Other: None. IMPRESSION: Probable hepatic steatosis.  Otherwise unremarkable.  Electronically Signed   By: Misty Stanley M.D.   On: 02/03/2021 14:53   Scheduled Meds: . (feeding supplement) PROSource Plus  30 mL Oral BID BM  . enoxaparin (LOVENOX) injection  40 mg Subcutaneous Q24H  . feeding supplement  237 mL Oral BID BM  . folic acid  1 mg Oral Daily  . gabapentin  900 mg Oral TID  . levothyroxine  25 mcg Oral Q0600  . multivitamin with minerals  1 tablet Oral Daily  . thiamine  100 mg Oral Daily   Or  . thiamine  100 mg Intravenous Daily   Continuous Infusions: . fluconazole (DIFLUCAN) IV 200 mg (02/04/21 1236)  . Immune Globulin 10% 25 g (02/04/21 2020)    LOS: 2 days   Time spent: 16mn  Keiden Deskin C Taylie Helder, DO [facility]  If 7PM-7AM, please contact  night-coverage [email or web address]  02/05/2021, 7:51 AM

## 2021-02-05 NOTE — Evaluation (Signed)
Physical Therapy Evaluation Patient Details Name: Travis Mcgee MRN: [id number] DOB: 14-Nov-1980 Today's Date: 02/05/2021   History of Present Illness  40 year old male admitted for painful paresthesias of bilateral lower extremities in the setting of AIDS.  PMHx significant for HIV, prior syphilis and GERD and previous admission for bilateral leg weakness and paresthesias which he was treated for possible Guillain-Barr versus neurosyphilis.  Clinical Impression  Pt admitted with above diagnosis.  Pt currently with functional limitations due to the deficits listed below (see PT Problem List). Pt will benefit from skilled PT to increase their independence and safety with mobility to allow discharge to the venue listed below.  Pt assisted with ambulating in room and currently min/guard for mobility with RW.  Pt encouraged to sit EOB and perform LEs exercises as tolerated (seated marching, LAQs, and ankle DF/PF with feet on floor) during acute stay.       Follow Up Recommendations Home health PT ((if possible, pt lives in shed))    Equipment Recommendations  Rolling walker with 5" wheels    Recommendations for Other Services       Precautions / Restrictions Precautions Precautions: Fall Restrictions Weight Bearing Restrictions: No      Mobility  Bed Mobility Overal bed mobility: Needs Assistance Bed Mobility: Supine to Sit;Sit to Supine     Supine to sit: Min guard Sit to supine: Min guard        Transfers Overall transfer level: Needs assistance Equipment used: Rolling walker (2 wheeled) Transfers: Sit to/from Stand Sit to Stand: Min guard         General transfer comment: cues for hand placement  Ambulation/Gait Ambulation/Gait assistance: Min guard Gait Distance (Feet): 24 Feet Assistive device: Rolling walker (2 wheeled) Gait Pattern/deviations: Step-through pattern;Decreased stride length;Narrow base of support     General Gait Details: small steps, narrow  BOS, reliant on UE support of RW, min/guard for safety  Stairs            Wheelchair Mobility    Modified Rankin (Stroke Patients Only)       Balance Overall balance assessment: History of Falls                                           Pertinent Vitals/Pain Pain Assessment: Faces Faces Pain Scale: Hurts little more Pain Location: bil LEs Pain Descriptors / Indicators: Pins and needles;Numbness Pain Intervention(s): Monitored during session;Repositioned    Home Living Family/patient expects to be discharged to:: Private residence Living Arrangements: Alone   Type of Home: Other(Comment) (states he lives in a "shed") Home Access: Level entry     Home Layout: One level Home Equipment: None Additional Comments: no running water    Prior Function Level of Independence: Independent               Hand Dominance        Extremity/Trunk Assessment        Lower Extremity Assessment Lower Extremity Assessment: Generalized weakness;RLE deficits/detail;LLE deficits/detail RLE Deficits / Details: per neuro note: "5/5 strength was present in all four extremities, though he guards in the distal lower extremities, and so he may have very slight 4+/5 weakness, but doubt"; but also demonstrating guarding in LEs with PT LLE Deficits / Details: pt reports numbness in bil plantar feet since GBS in October and never regained full sensation, also reports tingling up to groin  level bilaterally, sensative to light touch on dorsal feet and lower legs.       Communication   Communication: No difficulties  Cognition Arousal/Alertness: Awake/alert Behavior During Therapy: WFL for tasks assessed/performed Overall Cognitive Status: Within Functional Limits for tasks assessed                                        General Comments      Exercises     Assessment/Plan    PT Assessment Patient needs continued PT services  PT Problem List  Decreased strength;Decreased mobility;Decreased activity tolerance;Decreased balance;Decreased knowledge of use of DME       PT Treatment Interventions DME instruction;Gait training;Therapeutic exercise;Functional mobility training;Therapeutic activities;Patient/family education;Balance training    PT Goals (Current goals can be found in the Care Plan section)  Acute Rehab PT Goals PT Goal Formulation: With patient Time For Goal Achievement: 02/19/21 Potential to Achieve Goals: Good    Frequency Min 3X/week   Barriers to discharge        Co-evaluation               AM-PAC PT "6 Clicks" Mobility  Outcome Measure Help needed turning from your back to your side while in a flat bed without using bedrails?: A Little Help needed moving from lying on your back to sitting on the side of a flat bed without using bedrails?: A Little Help needed moving to and from a bed to a chair (including a wheelchair)?: A Little Help needed standing up from a chair using your arms (e.g., wheelchair or bedside chair)?: A Little Help needed to walk in hospital room?: A Little Help needed climbing 3-5 steps with a railing? : A Lot 6 Click Score: 17    End of Session Equipment Utilized During Treatment: Gait belt Activity Tolerance: Patient tolerated treatment well Patient left: with call bell/phone within reach;in bed Nurse Communication: Mobility status PT Visit Diagnosis: Other abnormalities of gait and mobility (R26.89)    Time: 1113-1130 PT Time Calculation (min) (ACUTE ONLY): 17 min   Charges:   PT Evaluation $PT Eval Low Complexity: 1 Low     Kati PT, DPT Acute Rehabilitation Services Pager: [phone] Office: [phone]  Maida Sale E 02/05/2021, 1:20 PM

## 2021-02-05 NOTE — Progress Notes (Signed)
[facility]astroenterology Progress Note  Travis Mcgee 40 y.o. 1981-08-17  CC:  Nausea/vomiting  Subjective: Patient denies any further episodes of nausea/vomiting.  He is tolerating a soft diet.  Notes some "rumbling" in stomach after eating but denies any abdominal pain.  Denies diarrhea, melena, or hematochezia.  ROS : Review of Systems  Cardiovascular: Negative for chest pain and palpitations.  Gastrointestinal: Negative for abdominal pain, blood in stool, constipation, diarrhea, heartburn, melena, nausea and vomiting.   Objective: Vital signs in last 24 hours: Vitals:   02/05/21 0425 02/05/21 0856  BP: (!) 134/93   Pulse: 80   Resp: 19   Temp: 100 F (37.8 C) (!) 100.4 F (38 C)  SpO2: 100%     Physical Exam:  General:  Alert, oriented, cooperative, no distress  Head:  Normocephalic, without obvious abnormality, atraumatic  Eyes:  Anicteric sclera, EOMs intact   Lungs:   Respirations unlabored  Heart:  Regular rate and rhythm  Abdomen:   Soft, non-tender, non-distended  Pulses: 2+ and symmetric    Lab Results: Recent Labs    02/04/21 0226 02/05/21 0637  NA 130* 128*  K 3.5 3.6  CL 94* 97*  CO2 28 24  GLUCOSE 86 99  BUN <5* 6  CREATININE 0.62 0.56*  CALCIUM 7.8* 7.7*  MG 1.5*  --   PHOS 4.0  --    Recent Labs    02/04/21 0226 02/05/21 0637  AST 154* 133*  ALT 80* 72*  ALKPHOS 370* 309*  BILITOT 1.4* 0.7  PROT 7.6 7.6  ALBUMIN 2.0* 2.0*   Recent Labs    02/03/21 1036 02/04/21 0226 02/05/21 0637  WBC 3.6* 2.9* 4.0  NEUTROABS 2.4  --   --   HGB 13.5 11.5* 10.5*  HCT 40.7 34.8* 32.5*  MCV 101.0* 102.7* 105.2*  PLT 179 146* 154   No results for input(s): LABPROT, INR in the last 72 hours.    Assessment: Nausea/vomiting, resolved.  Unclear etiology.    Mild odynophagia, improving.  Transaminitis: Probable hepatic steatosis on ultrasound 02/03/2021.  CBD 5 mm. -Downtrending LFTs: T. Bili 0.7/ AST 133/ ALT 72/ ALP 309 -HCV RNA  pending  Plan: Continue diflucan 200 mg for a total of 7 days empirically in case of Candida esophagitis.  IV while admitted, transition to PO if discharged prior to completion of course.  If nausea/vomiting recurs, consider HIDA scan.   [facility] will sign off. Please contact us if we can be of any further assistance during this hospital stay.  Edrick Kins PA-C 02/05/2021, 10:03 AM  Contact #  [phone]

## 2021-02-06 DIAGNOSIS — B20 Human immunodeficiency virus [HIV] disease: Secondary | ICD-10-CM | POA: Diagnosis not present

## 2021-02-06 DIAGNOSIS — R7989 Other specified abnormal findings of blood chemistry: Secondary | ICD-10-CM | POA: Diagnosis not present

## 2021-02-06 DIAGNOSIS — F1010 Alcohol abuse, uncomplicated: Secondary | ICD-10-CM | POA: Diagnosis not present

## 2021-02-06 DIAGNOSIS — G610 Guillain-Barre syndrome: Secondary | ICD-10-CM | POA: Diagnosis not present

## 2021-02-06 DIAGNOSIS — U071 COVID-19: Secondary | ICD-10-CM

## 2021-02-06 LAB — CBC
HCT: 35.8 % — ABNORMAL LOW (ref 39.0–52.0)
Hemoglobin: 11.6 g/dL — ABNORMAL LOW (ref 13.0–17.0)
MCH: 34.1 pg — ABNORMAL HIGH (ref 26.0–34.0)
MCHC: 32.4 g/dL (ref 30.0–36.0)
MCV: 105.3 fL — ABNORMAL HIGH (ref 80.0–100.0)
Platelets: 155 10*3/uL (ref 150–400)
RBC: 3.4 MIL/uL — ABNORMAL LOW (ref 4.22–5.81)
RDW: 18.6 % — ABNORMAL HIGH (ref 11.5–15.5)
WBC: 3.4 10*3/uL — ABNORMAL LOW (ref 4.0–10.5)
nRBC: 0 % (ref 0.0–0.2)

## 2021-02-06 LAB — COMPREHENSIVE METABOLIC PANEL
ALT: 57 U/L — ABNORMAL HIGH (ref 0–44)
AST: 73 U/L — ABNORMAL HIGH (ref 15–41)
Albumin: 2 g/dL — ABNORMAL LOW (ref 3.5–5.0)
Alkaline Phosphatase: 295 U/L — ABNORMAL HIGH (ref 38–126)
Anion gap: 9 (ref 5–15)
BUN: 6 mg/dL (ref 6–20)
CO2: 25 mmol/L (ref 22–32)
Calcium: 8 mg/dL — ABNORMAL LOW (ref 8.9–10.3)
Chloride: 99 mmol/L (ref 98–111)
Creatinine, Ser: 0.64 mg/dL (ref 0.61–1.24)
GFR, Estimated: >60 mL/min (ref >60)
Glucose, Bld: 97 mg/dL (ref 70–99)
Potassium: 3.3 mmol/L — ABNORMAL LOW (ref 3.5–5.1)
Sodium: 133 mmol/L — ABNORMAL LOW (ref 135–145)
Total Bilirubin: 0.2 mg/dL — ABNORMAL LOW (ref 0.3–1.2)
Total Protein: 7.9 g/dL (ref 6.5–8.1)

## 2021-02-06 LAB — T.PALLIDUM AB, TOTAL: T Pallidum Abs: REACTIVE — AB

## 2021-02-06 LAB — HCV RNA QUANT: HCV Quantitative: NOT DETECTED IU/mL (ref >50)

## 2021-02-06 MED ORDER — FLUCONAZOLE 100 MG PO TABS
200.0000 mg | ORAL_TABLET | Freq: Every day | ORAL | Status: DC
  Administered 2021-02-06 – 2021-02-11 (×6): 200 mg via ORAL
  Filled 2021-02-06 (×6): qty 2

## 2021-02-06 MED ORDER — SULFAMETHOXAZOLE-TRIMETHOPRIM 800-160 MG PO TABS
1.0000 | ORAL_TABLET | ORAL | Status: DC
  Administered 2021-02-06 – 2021-02-11 (×3): 1 via ORAL
  Filled 2021-02-06 (×3): qty 1

## 2021-02-06 MED ORDER — POTASSIUM CHLORIDE CRYS ER 20 MEQ PO TBCR
30.0000 meq | EXTENDED_RELEASE_TABLET | ORAL | Status: AC
  Administered 2021-02-06 (×2): 30 meq via ORAL
  Filled 2021-02-06 (×2): qty 1

## 2021-02-06 MED ORDER — BICTEGRAVIR-EMTRICITAB-TENOFOV 50-200-25 MG PO TABS
1.0000 | ORAL_TABLET | Freq: Every day | ORAL | Status: DC
  Administered 2021-02-06 – 2021-02-11 (×6): 1 via ORAL
  Filled 2021-02-06 (×6): qty 1

## 2021-02-06 MED ORDER — TRAZODONE HCL 50 MG PO TABS
50.0000 mg | ORAL_TABLET | Freq: Every day | ORAL | Status: DC
  Administered 2021-02-06 – 2021-02-10 (×5): 50 mg via ORAL
  Filled 2021-02-06 (×5): qty 1
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
## 2021-02-06 NOTE — Plan of Care (Signed)
  Problem: Activity: Goal: Risk for activity intolerance will decrease Outcome: Progressing   Problem: Pain Managment: Goal: General experience of comfort will improve Outcome: Progressing   Problem: Nutrition: Goal: Adequate nutrition will be maintained Outcome: Adequate for Discharge   

## 2021-02-06 NOTE — Progress Notes (Addendum)
PROGRESS NOTE    Travis Mcgee  RXV:[id number] DOB: 01-04-1981 DOA: 02/03/2021 PCP: Patient, No Pcp Per (Inactive)    Brief Narrative:  Travis Mcgee is a 40 year old [employer] veteran male with past medical history significant for HIV/AIDS, DM Bears syndrome, latent syphilis, homelessness who presented to [facility] on 02/03/2021 with complaints of nausea/vomiting and ascending bilateral lower extremity weakness/numbness for 2-3 weeks.  Patient with significant bilateral lower extremity weakness with falls associated with paresthesias and pain up to his abdomen.    Previously admitted October 2021 for concern of GBS versus neurosyphilis and treated with IVIG and IV penicillin.  In the ED, patient was found to be afebrile with mild tachycardia, no hypoxia.  Alkaline phosphatase 46, lipase 21, AST 182, ALP 103, total bilirubin 1.6.  Covid-19 PCR incidentally positive.  Abdominal ultrasound with probable hepatic steatosis otherwise unremarkable.  Patient received IV fentanyl, Zofran and 1 L LR bolus.  EDP consulted ID who recommended admission with GI consultation.  Hospital service consulted for further evaluation management.   Assessment & Plan:   Principal Problem:   HIV (human immunodeficiency virus infection) (HCC) Active Problems:   AIDS (acquired immune deficiency syndrome) (HCC)   History of syphilis   Guillain Barr syndrome (HCC)   Leg weakness, bilateral   Nausea & vomiting   Elevated LFTs   Alcohol abuse   Bilateral lower extremity weakness with associated pain and paresthesias Concern for Guillain-Barr syndrome Patient presenting with a sending paresthesias associated with weakness.  Reported history of DM Barr syndrome status post IVIG.  MR C/T-spine unrevealing.  B12 level elevated. --Neurology following, appreciate assistance --B1 level: Pending --Continue IVIG x 5 days --Gabapentin 900 mg p.o. 3 times daily --Avoid narcotics --Continue PT/OT  efforts  Nausea/vomiting 2/2 Candida esophagitis/thrush Patient was noted to have elevated LFTs with hepatic steatosis on ultrasound.  Gastroenterology recommended fluconazole for concern of possible fungal esophagitis/thrush.  Currently tolerating diet. --Fluconazole p.o. daily plan 14-day course --Zofran  Advanced HIV disease Patient reportedly off Biktarvy for several weeks.  CD4 count 51 and viral load 527,000 copies. --Continue Biktarvy --TMP-SMX for PCP prophylaxis M/W/F --ID follow-up outpatient  Hx secondary syphilis Originally diagnosed in urgent care February 2021 when he presented with a rash.  Treated with Bicillin x1.  RPR titer at that time 1: 256, repeat titer 1: 16.  Covid-19 viral infection, asymptomatic, incidental finding Patient received one-time dose of Pfizer vaccination.  Currently asymptomatic. --Continue supportive care  Transaminitis Patient with notable elevation in AST/ALT, suspect secondary to chronic EtOH abuse.  Also with hepatitis B core antibody, hepatitis C antibody positive. --LFTs improving --Discussed with patient need for complete abstinence from alcohol  Hypernatremia in the setting of poor oral intake with nausea/vomiting, POA --Encourage increase oral intake --EtOH cessation  Hypokalemia Potassium 3.3 this morning, will replete. --Repeat potassium with magnesium in the a.m.   DVT prophylaxis: Lovenox   Code Status: Full Code Family Communication: Updated patient extensively at bedside  Disposition Plan:  Level of care: Progressive Status is: Inpatient  Remains inpatient appropriate because:Unsafe d/c plan, IV treatments appropriate due to intensity of illness or inability to take PO and Inpatient level of care appropriate due to severity of illness   Dispo: The patient is from: Home              Anticipated d/c is to: Home [facility]              Patient currently is not  medically stable to d/c.    Difficult to place patient No        Consultants:   Infectious disease  [facility] - signed off 4/12  Neurology  Procedures:   None  Antimicrobials:   Fluconazole 4/11>>   Subjective: Patient seen and examined bedside, resting comfortably.  Continues with lower extremity pain, weakness and paresthesias; slowly improving.  Was able to walk with physical therapy yesterday with assistance of a walker.  Denies headache, no fever/chills/night sweats, no nausea/vomiting/diarrhea, no chest pain, palpitations, no shortness of breath, no abdominal pain.  No acute concerns overnight per nursing staff.  Objective: Vitals:   02/05/21 2331 02/06/21 0059 02/06/21 0350 02/06/21 1335  BP: 121/87 120/84 130/90 (!) 139/97  Pulse: 74 68 81 74  Resp: Temp: 98.6 F (37 C) 98.8 F (37.1 C) 99.2 F (37.3 C) 99.4 F (37.4 C)  TempSrc: Oral Oral Oral   SpO2: 100% 99% 97% 99%  Weight:      Height:        Intake/Output Summary (Last 24 hours) at 02/06/2021 1551 Last data filed at 02/06/2021 0408 Gross per 24 hour  Intake 120 ml  Output 2550 ml  Net -2430 ml   Filed Weights   02/03/21 1749  Weight: 63.8 kg    Examination:  General exam: Appears calm and comfortable  Respiratory system: Clear to auscultation. Respiratory effort normal.  On room air Cardiovascular system: S1 & S2 heard, RRR. No JVD, murmurs, rubs, gallops or clicks. No pedal edema. Gastrointestinal system: Abdomen is nondistended, soft and nontender. No organomegaly or masses felt. Normal bowel sounds heard. Central nervous system: Alert and oriented. No focal neurological deficits. Extremities: Bilateral lower extremities for plus/5, otherwise muscle strength preserved Skin: No rashes, lesions or ulcers Psychiatry: Judgement and insight appear normal. Mood & affect appropriate.     Data Reviewed: I have personally reviewed following labs and imaging studies  CBC: Recent Labs  Lab  02/03/21 1036 02/04/21 0226 02/05/21 0637 02/06/21 0346  WBC 3.6* 2.9* 4.0 3.4*  NEUTROABS 2.4  --   --   --   HGB 13.5 11.5* 10.5* 11.6*  HCT 40.7 34.8* 32.5* 35.8*  MCV 101.0* 102.7* 105.2* 105.3*  PLT 179 146* 154 155   Basic Metabolic Panel: Recent Labs  Lab 02/03/21 1036 02/04/21 0226 02/05/21 0637 02/06/21 0346  NA 133* 130* 128* 133*  K 3.2* 3.5 3.6 3.3*  CL 93* 94* 97* 99  CO2 GLUCOSE 100* 86 99 97  BUN <5* <5* 6 6  CREATININE 0.67 0.62 0.56* 0.64  CALCIUM 8.3* 7.8* 7.7* 8.0*  MG  --  1.5*  --   --   PHOS  --  4.0  --   --    GFR: Estimated Creatinine Clearance: 109.7 mL/min (by C-G formula based on SCr of 0.64 mg/dL). Liver Function Tests: Recent Labs  Lab 02/03/21 1036 02/04/21 0226 02/05/21 0637 02/06/21 0346  AST 192* 154* 133* 73*  ALT 103* 80* 72* 57*  ALKPHOS 486* 370* 309* 295*  BILITOT 1.6* 1.4* 0.7 0.2*  PROT 8.8* 7.6 7.6 7.9  ALBUMIN 2.5* 2.0* 2.0* 2.0*   Recent Labs  Lab 02/03/21 1036  LIPASE 21   No results for input(s): AMMONIA in the last 168 hours. Coagulation Profile: No results for input(s): INR, PROTIME in the last 168 hours. Cardiac Enzymes: No results for input(s): CKTOTAL, CKMB, CKMBINDEX, TROPONINI in the last 168 hours. BNP (  last 3 results) No results for input(s): PROBNP in the last 8760 hours. HbA1C: No results for input(s): HGBA1C in the last 72 hours. CBG: No results for input(s): GLUCAP in the last 168 hours. Lipid Profile: No results for input(s): CHOL, HDL, LDLCALC, TRIG, CHOLHDL, LDLDIRECT in the last 72 hours. Thyroid Function Tests: No results for input(s): TSH, T4TOTAL, FREET4, T3FREE, THYROIDAB in the last 72 hours. Anemia Panel: Recent Labs    02/05/21 0637  VITAMINB12 946*   Sepsis Labs: No results for input(s): PROCALCITON, LATICACIDVEN in the last 168 hours.  Recent Results (from the past 240 hour(s))  Resp Panel by RT-PCR (Flu A&B, Covid) Nasopharyngeal Swab     Status: Abnormal    Collection Time: 02/03/21  3:40 PM   Specimen: Nasopharyngeal Swab; Nasopharyngeal(NP) swabs in vial transport medium  Result Value Ref Range Status   SARS Coronavirus 2 by RT PCR POSITIVE (A) NEGATIVE Final    Comment: CRITICAL RESULT CALLED TO, READ BACK BY AND VERIFIED WITH: AMELIA WOART RN 02/03/21 @1924  BY P.HENDERSON    Influenza A by PCR NEGATIVE NEGATIVE Final   Influenza B by PCR NEGATIVE NEGATIVE Final    Comment: (NOTE) The Xpert Xpress SARS-CoV-2/FLU/RSV plus assay is intended as an aid in the diagnosis of influenza from Nasopharyngeal swab specimens and should not be used as a sole basis for treatment. Nasal washings and aspirates are unacceptable for Xpert Xpress SARS-CoV-2/FLU/RSV testing.  Fact Sheet for Patients: BloggerCourse.comhttps://www.fda.gov/media/152166/download  Fact Sheet for Healthcare Providers: SeriousBroker.ithttps://www.fda.gov/media/152162/download  This test is not yet approved or cleared by the [place] FDA and has been authorized for detection and/or diagnosis of SARS-CoV-2 by FDA under an Emergency Use Authorization (EUA). This EUA will remain in effect (meaning this test can be used) for the duration of the COVID-19 declaration under Section 564(b)(1) of the Act, 21 U.S.C. section 360bbb-3(b)(1), unless the authorization is terminated or revoked.  Performed at [facility], [address] [place], [place] [address]   Acid Fast Smear (AFB)     Status: None   Collection Time: 02/04/21  4:11 PM   Specimen: Vein; Blood  Result Value Ref Range Status   AFB Specimen Processing WRORD  Final    Comment: (NOTE) Test not performed. The required specimen for the test ordered was not received.      Unable to perform on whole blood.      Harrold Donathathan T. was notified 02/05/2021.    Acid Fast Smear NOT PERFORMED  Final    Comment: (NOTE) Test not performed Performed At: [facility] [address] [place], [place] [id number] Jolene SchimkeNagendra Sanjai MD  XB:[id number]:[phone]    Source (AFB) BLOOD  Final    Comment: Performed at [facility], [address] [place], [place] [address]         Radiology Studies: MR CERVICAL SPINE W WO CONTRAST  Result Date: 02/06/2021 CLINICAL DATA:  Initial evaluation for cervical radiculopathy. History of HIV, syphilis, Guillain-Barre syndrome, leg weakness. EXAM: MRI CERVICAL AND THORACIC SPINE WITHOUT CONTRAST TECHNIQUE: Multiplanar and multiecho pulse sequences of the cervical spine, to include the craniocervical junction and cervicothoracic junction, and the thoracic spine, were obtained without intravenous contrast. COMPARISON:  None. Previous MRI from 07/29/2020 FINDINGS: MRI CERVICAL SPINE FINDINGS Alignment: Mild straightening of the normal cervical lordosis. Trace physiologic anterolisthesis of C7 on T1. Vertebrae: Vertebral body height maintained without acute or chronic fracture. Bone marrow signal intensity diffusely decreased on T1 weighted imaging, nonspecific, but most commonly related  to anemia, smoking, or obesity. No discrete or worrisome osseous lesions. No abnormal marrow edema or enhancement. Cord: Signal intensity within the cervical spinal cord is normal. A degree of underlying diffuse cord atrophy noted, similar to previous. No abnormal enhancement. No epidural collections. Posterior Fossa, vertebral arteries, paraspinal tissues: Visualized brain and posterior fossa within normal limits. Craniocervical junction normal. Paraspinous and prevertebral soft tissues within normal limits. Normal intravascular flow voids seen within the vertebral arteries bilaterally. Disc levels: C2-C3: Unremarkable. C3-C4:  Unremarkable. C4-C5: Mild bilateral uncovertebral hypertrophy without significant disc bulge. No spinal stenosis. Foramina remain patent. C5-C6: Mild right-sided uncovertebral hypertrophy without significant disc bulge. No spinal stenosis. Mild right C6 foraminal narrowing. Left neural  foramina remains patent. C6-C7: Mild right-sided uncovertebral hypertrophy without significant disc bulge. No spinal stenosis. Mild right C7 foraminal stenosis. Left neural foramina remains patent. C7-T1:  Unremarkable. MRI THORACIC SPINE FINDINGS Alignment: Trace scoliosis. Alignment otherwise normal with preservation of the normal thoracic kyphosis. No listhesis. Vertebrae: Vertebral body height maintained without acute or chronic fracture. Bone marrow signal intensity diffusely decreased on T1 weighted imaging, nonspecific, but most commonly related to anemia, smoking, or obesity. No discrete or worrisome osseous lesions. No abnormal marrow edema or enhancement. Cord: Signal intensity within the thoracic spinal cord is within normal limits. Degree of underlying diffuse cord atrophy noted. No abnormal enhancement. No epidural collections or other abnormality. Paraspinal and other soft tissues: Paraspinous soft tissues within normal limits. Trace layering bilateral pleural effusions noted. Visualized visceral structures otherwise grossly unremarkable. Disc levels: T11-12: Disc desiccation with mild discogenic reactive endplate change. No significant disc bulge or focal disc herniation. No stenosis. Otherwise, no other significant disc pathology seen within the thoracic spine. No other disc bulge or focal disc herniation. No other stenosis or neural impingement. IMPRESSION: MRI CERVICAL SPINE IMPRESSION: 1. 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 2. Right-sided uncovertebral hypertrophy at C5-6 and C6-7 with resultant mild right C6 and C7 foraminal stenosis. 3. Otherwise unremarkable and normal MRI of the cervical spine. MRI THORACIC SPINE IMPRESSION: 1. 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 2. Mild degenerative disc disease at T11-12 without stenosis. Otherwise no significant disc pathology, stenosis, or evidence for neural impingement. 3. Trace layering bilateral pleural effusions. Electronically  Signed   By: Rise Mu M.D.   On: 02/06/2021 02:13   MR THORACIC SPINE W WO CONTRAST  Result Date: 02/06/2021 CLINICAL DATA:  Initial evaluation for cervical radiculopathy. History of HIV, syphilis, Guillain-Barre syndrome, leg weakness. EXAM: MRI CERVICAL AND THORACIC SPINE WITHOUT CONTRAST TECHNIQUE: Multiplanar and multiecho pulse sequences of the cervical spine, to include the craniocervical junction and cervicothoracic junction, and the thoracic spine, were obtained without intravenous contrast. COMPARISON:  None. Previous MRI from 07/29/2020 FINDINGS: MRI CERVICAL SPINE FINDINGS Alignment: Mild straightening of the normal cervical lordosis. Trace physiologic anterolisthesis of C7 on T1. Vertebrae: Vertebral body height maintained without acute or chronic fracture. Bone marrow signal intensity diffusely decreased on T1 weighted imaging, nonspecific, but most commonly related to anemia, smoking, or obesity. No discrete or worrisome osseous lesions. No abnormal marrow edema or enhancement. Cord: Signal intensity within the cervical spinal cord is normal. A degree of underlying diffuse cord atrophy noted, similar to previous. No abnormal enhancement. No epidural collections. Posterior Fossa, vertebral arteries, paraspinal tissues: Visualized brain and posterior fossa within normal limits. Craniocervical junction normal. Paraspinous and prevertebral soft tissues within normal limits. Normal intravascular flow voids seen within the vertebral arteries bilaterally. Disc levels: C2-C3: Unremarkable. C3-C4:  Unremarkable. C4-C5: Mild bilateral uncovertebral hypertrophy without significant disc bulge. No spinal stenosis. Foramina remain patent. C5-C6: Mild right-sided uncovertebral hypertrophy without significant disc bulge. No spinal stenosis. Mild right C6 foraminal narrowing. Left neural foramina remains patent. C6-C7: Mild right-sided uncovertebral hypertrophy without significant disc bulge. No spinal  stenosis. Mild right C7 foraminal stenosis. Left neural foramina remains patent. C7-T1:  Unremarkable. MRI THORACIC SPINE FINDINGS Alignment: Trace scoliosis. Alignment otherwise normal with preservation of the normal thoracic kyphosis. No listhesis. Vertebrae: Vertebral body height maintained without acute or chronic fracture. Bone marrow signal intensity diffusely decreased on T1 weighted imaging, nonspecific, but most commonly related to anemia, smoking, or obesity. No discrete or worrisome osseous lesions. No abnormal marrow edema or enhancement. Cord: Signal intensity within the thoracic spinal cord is within normal limits. Degree of underlying diffuse cord atrophy noted. No abnormal enhancement. No epidural collections or other abnormality. Paraspinal and other soft tissues: Paraspinous soft tissues within normal limits. Trace layering bilateral pleural effusions noted. Visualized visceral structures otherwise grossly unremarkable. Disc levels: T11-12: Disc desiccation with mild discogenic reactive endplate change. No significant disc bulge or focal disc herniation. No stenosis. Otherwise, no other significant disc pathology seen within the thoracic spine. No other disc bulge or focal disc herniation. No other stenosis or neural impingement. IMPRESSION: MRI CERVICAL SPINE IMPRESSION: 1. 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 2. Right-sided uncovertebral hypertrophy at C5-6 and C6-7 with resultant mild right C6 and C7 foraminal stenosis. 3. Otherwise unremarkable and normal MRI of the cervical spine. MRI THORACIC SPINE IMPRESSION: 1. 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 2. Mild degenerative disc disease at T11-12 without stenosis. Otherwise no significant disc pathology, stenosis, or evidence for neural impingement. 3. Trace layering bilateral pleural effusions. Electronically Signed   By: Rise Mu M.D.   On: 02/06/2021 02:13        Scheduled Meds: . (feeding supplement)  PROSource Plus  30 mL Oral BID BM  . bictegravir-emtricitabine-tenofovir AF  1 tablet Oral Daily  . enoxaparin (LOVENOX) injection  40 mg Subcutaneous Q24H  . feeding supplement  237 mL Oral BID BM  . fluconazole  200 mg Oral Daily  . folic acid  1 mg Oral Daily  . gabapentin  900 mg Oral TID  . levothyroxine  25 mcg Oral Q0600  . multivitamin with minerals  1 tablet Oral Daily  . sulfamethoxazole-trimethoprim  1 tablet Oral Once per day on Mon Wed Fri  . thiamine  100 mg Oral Daily   Or  . thiamine  100 mg Intravenous Daily  . traZODone  50 mg Oral QHS   Continuous Infusions: . Immune Globulin 10% Stopped (02/06/21 0103)     LOS: 3 days    Time spent: 42 minutes spent on chart review, discussion with nursing staff, consultants, updating family and interview/physical exam; more than 50% of that time was spent in counseling and/or coordination of care.    Alvira Philips [place], DO [facility] Available via Epic secure chat 7am-7pm After these hours, please refer to coverage provider listed on [email or web address] 02/06/2021, 3:51 PM

## 2021-02-07 ENCOUNTER — Other Ambulatory Visit (HOSPITAL_COMMUNITY): Payer: Self-pay

## 2021-02-07 DIAGNOSIS — G610 Guillain-Barre syndrome: Secondary | ICD-10-CM | POA: Diagnosis not present

## 2021-02-07 DIAGNOSIS — R7989 Other specified abnormal findings of blood chemistry: Secondary | ICD-10-CM | POA: Diagnosis not present

## 2021-02-07 DIAGNOSIS — F1010 Alcohol abuse, uncomplicated: Secondary | ICD-10-CM | POA: Diagnosis not present

## 2021-02-07 DIAGNOSIS — U071 COVID-19: Secondary | ICD-10-CM | POA: Diagnosis not present

## 2021-02-07 DIAGNOSIS — B20 Human immunodeficiency virus [HIV] disease: Secondary | ICD-10-CM | POA: Diagnosis not present

## 2021-02-07 LAB — COMPREHENSIVE METABOLIC PANEL
ALT: 49 U/L — ABNORMAL HIGH (ref 0–44)
AST: 61 U/L — ABNORMAL HIGH (ref 15–41)
Albumin: 2.2 g/dL — ABNORMAL LOW (ref 3.5–5.0)
Alkaline Phosphatase: 253 U/L — ABNORMAL HIGH (ref 38–126)
Anion gap: 6 (ref 5–15)
BUN: 7 mg/dL (ref 6–20)
CO2: 25 mmol/L (ref 22–32)
Calcium: 8.4 mg/dL — ABNORMAL LOW (ref 8.9–10.3)
Chloride: 103 mmol/L (ref 98–111)
Creatinine, Ser: 0.68 mg/dL (ref 0.61–1.24)
GFR, Estimated: >60 mL/min (ref >60)
Glucose, Bld: 98 mg/dL (ref 70–99)
Potassium: 4.1 mmol/L (ref 3.5–5.1)
Sodium: 134 mmol/L — ABNORMAL LOW (ref 135–145)
Total Bilirubin: 0.7 mg/dL (ref 0.3–1.2)
Total Protein: 9 g/dL — ABNORMAL HIGH (ref 6.5–8.1)

## 2021-02-07 LAB — MAGNESIUM: Magnesium: 1.6 mg/dL — ABNORMAL LOW (ref 1.7–2.4)

## 2021-02-07 MED ORDER — BICTEGRAVIR-EMTRICITAB-TENOFOV 50-200-25 MG PO TABS
1.0000 | ORAL_TABLET | Freq: Every day | ORAL | 11 refills | Status: DC
  Filled 2021-02-07: qty 30, 30d supply, fill #0

## 2021-02-07 MED ORDER — FLUCONAZOLE 200 MG PO TABS
200.0000 mg | ORAL_TABLET | Freq: Every day | ORAL | 0 refills | Status: AC
  Filled 2021-02-07: qty 11, 11d supply, fill #0

## 2021-02-07 MED ORDER — MAGNESIUM SULFATE 2 GM/50ML IV SOLN
2.0000 g | Freq: Once | INTRAVENOUS | Status: AC
  Administered 2021-02-07: 2 g via INTRAVENOUS
  Filled 2021-02-07: qty 50

## 2021-02-07 MED ORDER — GUAIFENESIN-DM 100-10 MG/5ML PO SYRP: 5.0000 mL | ORAL_SOLUTION | ORAL | Status: DC | PRN

## 2021-02-07 MED ORDER — SULFAMETHOXAZOLE-TRIMETHOPRIM 800-160 MG PO TABS
ORAL_TABLET | ORAL | 0 refills | Status: AC
  Filled 2021-02-07: qty 12, 28d supply, fill #0

## 2021-02-07 NOTE — Progress Notes (Signed)
PROGRESS NOTE    Travis Mcgee  MAU:[id number] DOB: 04-16-1981 DOA: 02/03/2021 PCP: Patient, No Pcp Per (Inactive)    Brief Narrative:  Travis Mcgee is a 40 year old [employer] veteran male with past medical history significant for HIV/AIDS, DM Bears syndrome, latent syphilis, homelessness who presented to [facility] on 02/03/2021 with complaints of nausea/vomiting and ascending bilateral lower extremity weakness/numbness for 2-3 weeks.  Patient with significant bilateral lower extremity weakness with falls associated with paresthesias and pain up to his abdomen.    Previously admitted October 2021 for concern of GBS versus neurosyphilis and treated with IVIG and IV penicillin.  In the ED, patient was found to be afebrile with mild tachycardia, no hypoxia.  Alkaline phosphatase 46, lipase 21, AST 182, ALP 103, total bilirubin 1.6.  Covid-19 PCR incidentally positive.  Abdominal ultrasound with probable hepatic steatosis otherwise unremarkable.  Patient received IV fentanyl, Zofran and 1 L LR bolus.  EDP consulted ID who recommended admission with GI consultation.  Hospital service consulted for further evaluation management.   Assessment & Plan:   Principal Problem:   HIV (human immunodeficiency virus infection) (HCC) Active Problems:   AIDS (acquired immune deficiency syndrome) (HCC)   History of syphilis   Guillain Barr syndrome (HCC)   Leg weakness, bilateral   Nausea & vomiting   Elevated LFTs   Alcohol abuse   Bilateral lower extremity weakness with associated pain and paresthesias Concern for Guillain-Barr syndrome Patient presenting with a sending paresthesias associated with weakness.  Reported history of DM Barr syndrome status post IVIG.  MR C/T-spine unrevealing.  B12 level elevated.  Neurology was consulted to follow during hospital course.  Patient completed 5-day course of IVIG.  Deferring LP given confound lab analysis, this was discussed with neurology and  infectious disease.  Given his poorly controlled HIV, HIV myelopathy also in the differential given his symptoms. --B1 level: Pending --Gabapentin 900 mg p.o. 3 times daily --Avoid narcotics --Continue PT/OT efforts  Nausea/vomiting 2/2 Candida esophagitis/thrush Patient was noted to have elevated LFTs with hepatic steatosis on ultrasound.  Gastroenterology recommended fluconazole for concern of possible fungal esophagitis/thrush.  Currently tolerating diet. --Fluconazole p.o. daily plan 14-day course with planned end date 02/18/2021 --Zofran as needed  Advanced HIV disease Patient reportedly off Biktarvy for several weeks.  CD4 count 51 and viral load 527,000 copies. --Continue Biktarvy --TMP-SMX for PCP prophylaxis M/W/F --ID follow-up outpatient  Hx secondary syphilis Originally diagnosed in urgent care February 2021 when he presented with a rash.  Treated with Bicillin x1.  RPR titer at that time 1: 256, repeat titer 1: 16.  Covid-19 viral infection, asymptomatic, incidental finding Patient received one-time dose of Pfizer vaccination.  Currently asymptomatic. --Continue supportive care  Transaminitis Patient with notable elevation in AST/ALT, suspect secondary to chronic EtOH abuse.  Also with hepatitis B core antibody, hepatitis C antibody positive. --LFTs improving --Discussed with patient need for complete abstinence from alcohol  Hypernatremia in the setting of poor oral intake with nausea/vomiting, POA --Encourage increase oral intake --EtOH cessation  Hypokalemia Potassium 4.1 this morning.  Hypomagnesemia Magnesium 1.6, will replete. --Repeat electrolytes in a.m.   DVT prophylaxis: Lovenox   Code Status: Full Code Family Communication: Updated patient extensively at bedside  Disposition Plan:  Level of care: Med-Surg Status is: Inpatient  Remains inpatient appropriate because:Unsafe d/c plan, IV treatments appropriate due to intensity of illness or  inability to take PO and Inpatient level of care appropriate due to severity of illness  Dispo: The patient is from: Home              Anticipated d/c is to: Home [facility]eless Shel[facility]              Patient currently is not medically stable to d/c.   Difficult to place patient No    Consultants:   Infectious disease  [facility] - signed off 4/12  Neurology  Procedures:   None  Antimicrobials:   Fluconazole 4/11>> (End 4/25)   Subjective: Patient seen and examined bedside, resting comfortably.  Continues with lower extremity weakness, pain; slowly improving.  Anticipates working with therapy today.  Discussed with both ID and neurology this morning, no further recommendations at this time.  No other concerns or complaints per patient. Denies headache, no fever/chills/night sweats, no nausea/vomiting/diarrhea, no chest pain, palpitations, no shortness of breath, no abdominal pain.  No acute concerns overnight per nursing staff.  Objective: Vitals:   02/06/21 2249 02/06/21 2323 02/06/21 2348 02/07/21 0541  BP: 117/88 120/88 115/87 (!) 137/91  Pulse: 73 80 82 66  Resp: 18 18 16 16   Temp: 98.5 F (36.9 C) 98.7 F (37.1 C) 98.1 F (36.7 C) 98 F (36.7 C)  TempSrc: Oral Oral Oral Oral  SpO2: 100% 100% 100% 98%  Weight:      Height:        Intake/Output Summary (Last 24 hours) at 02/07/2021 1210 Last data filed at 02/07/2021 0622 Gross per 24 hour  Intake 606.58 ml  Output 2400 ml  Net -1793.42 ml   Filed Weights   02/03/21 1749  Weight: 63.8 kg    Examination:  General exam: Appears calm and comfortable  Respiratory system: Clear to auscultation. Respiratory effort normal.  On room air Cardiovascular system: S1 & S2 heard, RRR. No JVD, murmurs, rubs, gallops or clicks. No pedal edema. Gastrointestinal system: Abdomen is nondistended, soft and nontender. No organomegaly or masses felt. Normal bowel sounds heard. Central nervous  system: Alert and oriented. No focal neurological deficits. Extremities: Bilateral lower extremities for 4+/5, otherwise muscle strength preserved Skin: No rashes, lesions or ulcers Psychiatry: Judgement and insight appear normal. Mood & affect appropriate.     Data Reviewed: I have personally reviewed following labs and imaging studies  CBC: Recent Labs  Lab 02/03/21 1036 02/04/21 0226 02/05/21 0637 02/06/21 0346  WBC 3.6* 2.9* 4.0 3.4*  NEUTROABS 2.4  --   --   --   HGB 13.5 11.5* 10.5* 11.6*  HCT 40.7 34.8* 32.5* 35.8*  MCV 101.0* 102.7* 105.2* 105.3*  PLT 179 146* 154 155   Basic Metabolic Panel: Recent Labs  Lab 02/03/21 1036 02/04/21 0226 02/05/21 0637 02/06/21 0346 02/07/21 0342  NA 133* 130* 128* 133* 134*  K 3.2* 3.5 3.6 3.3* 4.1  CL 93* 94* 97* 99 103  CO2 26 28 24 25 25   GLUCOSE 100* 86 99 97 98  BUN <5* <5* 6 6 7   CREATININE 0.67 0.62 0.56* 0.64 0.68  CALCIUM 8.3* 7.8* 7.7* 8.0* 8.4*  MG  --  1.5*  --   --  1.6*  PHOS  --  4.0  --   --   --    GFR: Estimated Creatinine Clearance: 109.7 mL/min (by C-G formula based on SCr of 0.68 mg/dL). Liver Function Tests: Recent Labs  Lab 02/03/21 1036 02/04/21 0226 02/05/21 0637 02/06/21 0346 02/07/21 0342  AST 192* 154* 133* 73* 61*  ALT 103* 80* 72* 57* 49*  ALKPHOS 486*  370* 309* 295* 253*  BILITOT 1.6* 1.4* 0.7 0.2* 0.7  PROT 8.8* 7.6 7.6 7.9 9.0*  ALBUMIN 2.5* 2.0* 2.0* 2.0* 2.2*   Recent Labs  Lab 02/03/21 1036  LIPASE 21   No results for input(s): AMMONIA in the last 168 hours. Coagulation Profile: No results for input(s): INR, PROTIME in the last 168 hours. Cardiac Enzymes: No results for input(s): CKTOTAL, CKMB, CKMBINDEX, TROPONINI in the last 168 hours. BNP (last 3 results) No results for input(s): PROBNP in the last 8760 hours. HbA1C: No results for input(s): HGBA1C in the last 72 hours. CBG: No results for input(s): GLUCAP in the last 168 hours. Lipid Profile: No results for  input(s): CHOL, HDL, LDLCALC, TRIG, CHOLHDL, LDLDIRECT in the last 72 hours. Thyroid Function Tests: No results for input(s): TSH, T4TOTAL, FREET4, T3FREE, THYROIDAB in the last 72 hours. Anemia Panel: Recent Labs    02/05/21 0637  VITAMINB12 946*   Sepsis Labs: No results for input(s): PROCALCITON, LATICACIDVEN in the last 168 hours.  Recent Results (from the past 240 hour(s))  Resp Panel by RT-PCR (Flu A&B, Covid) Nasopharyngeal Swab     Status: Abnormal   Collection Time: 02/03/21  3:40 PM   Specimen: Nasopharyngeal Swab; Nasopharyngeal(NP) swabs in vial transport medium  Result Value Ref Range Status   SARS Coronavirus 2 by RT PCR POSITIVE (A) NEGATIVE Final    Comment: CRITICAL RESULT CALLED TO, READ BACK BY AND VERIFIED WITH: AMELIA WOART RN 02/03/21  BY P.HENDERSON    Influenza A by PCR NEGATIVE NEGATIVE Final   Influenza B by PCR NEGATIVE NEGATIVE Final    Comment: (NOTE) The Xpert Xpress SARS-CoV-2/FLU/RSV plus assay is intended as an aid in the diagnosis of influenza from Nasopharyngeal swab specimens and should not be used as a sole basis for treatment. Nasal washings and aspirates are unacceptable for Xpert Xpress SARS-CoV-2/FLU/RSV testing.  Fact Sheet for Patients: BloggerCourse.com  Fact Sheet for Healthcare Providers: SeriousBroker.it  This test is not yet approved or cleared by the [place] FDA and has been authorized for detection and/or diagnosis of SARS-CoV-2 by FDA under an Emergency Use Authorization (EUA). This EUA will remain in effect (meaning this test can be used) for the duration of the COVID-19 declaration under Section 564(b)(1) of the Act, 21 U.S.C. section 360bbb-3(b)(1), unless the authorization is terminated or revoked.  Performed at [facility], [address] [place], [place] [address]   Acid Fast Smear (AFB)     Status: None   Collection Time: 02/04/21   4:11 PM   Specimen: Vein; Blood  Result Value Ref Range Status   AFB Specimen Processing WRORD  Final    Comment: (NOTE) Test not performed. The required specimen for the test ordered was not received.      Unable to perform on whole blood.      Harrold Donath T. was notified 02/05/2021.    Acid Fast Smear NOT PERFORMED  Final    Comment: (NOTE) Test not performed Performed At: [facility] [address] [place], [place] [id number] Jolene Schimke MD OZ:[id number]    Source (AFB) BLOOD  Final    Comment: Performed at [facility], [address] [place], [place] [address]         Radiology Studies: MR CERVICAL SPINE W WO CONTRAST  Result Date: 02/06/2021 CLINICAL DATA:  Initial evaluation for cervical radiculopathy. History of HIV, syphilis, Guillain-Barre syndrome, leg weakness. EXAM: MRI CERVICAL AND THORACIC SPINE WITHOUT CONTRAST TECHNIQUE: Multiplanar  and multiecho pulse sequences of the cervical spine, to include the craniocervical junction and cervicothoracic junction, and the thoracic spine, were obtained without intravenous contrast. COMPARISON:  None. Previous MRI from 07/29/2020 FINDINGS: MRI CERVICAL SPINE FINDINGS Alignment: Mild straightening of the normal cervical lordosis. Trace physiologic anterolisthesis of C7 on T1. Vertebrae: Vertebral body height maintained without acute or chronic fracture. Bone marrow signal intensity diffusely decreased on T1 weighted imaging, nonspecific, but most commonly related to anemia, smoking, or obesity. No discrete or worrisome osseous lesions. No abnormal marrow edema or enhancement. Cord: Signal intensity within the cervical spinal cord is normal. A degree of underlying diffuse cord atrophy noted, similar to previous. No abnormal enhancement. No epidural collections. Posterior Fossa, vertebral arteries, paraspinal tissues: Visualized brain and posterior fossa within normal limits. Craniocervical junction normal.  Paraspinous and prevertebral soft tissues within normal limits. Normal intravascular flow voids seen within the vertebral arteries bilaterally. Disc levels: C2-C3: Unremarkable. C3-C4:  Unremarkable. C4-C5: Mild bilateral uncovertebral hypertrophy without significant disc bulge. No spinal stenosis. Foramina remain patent. C5-C6: Mild right-sided uncovertebral hypertrophy without significant disc bulge. No spinal stenosis. Mild right C6 foraminal narrowing. Left neural foramina remains patent. C6-C7: Mild right-sided uncovertebral hypertrophy without significant disc bulge. No spinal stenosis. Mild right C7 foraminal stenosis. Left neural foramina remains patent. C7-T1:  Unremarkable. MRI THORACIC SPINE FINDINGS Alignment: Trace scoliosis. Alignment otherwise normal with preservation of the normal thoracic kyphosis. No listhesis. Vertebrae: Vertebral body height maintained without acute or chronic fracture. Bone marrow signal intensity diffusely decreased on T1 weighted imaging, nonspecific, but most commonly related to anemia, smoking, or obesity. No discrete or worrisome osseous lesions. No abnormal marrow edema or enhancement. Cord: Signal intensity within the thoracic spinal cord is within normal limits. Degree of underlying diffuse cord atrophy noted. No abnormal enhancement. No epidural collections or other abnormality. Paraspinal and other soft tissues: Paraspinous soft tissues within normal limits. Trace layering bilateral pleural effusions noted. Visualized visceral structures otherwise grossly unremarkable. Disc levels: T11-12: Disc desiccation with mild discogenic reactive endplate change. No significant disc bulge or focal disc herniation. No stenosis. Otherwise, no other significant disc pathology seen within the thoracic spine. No other disc bulge or focal disc herniation. No other stenosis or neural impingement. IMPRESSION: MRI CERVICAL SPINE IMPRESSION: 1. Mild diffuse cord atrophy, similar to  previous. 2. Right-sided uncovertebral hypertrophy at C5-6 and C6-7 with resultant mild right C6 and C7 foraminal stenosis. 3. Otherwise unremarkable and normal MRI of the cervical spine. MRI THORACIC SPINE IMPRESSION: 1. 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 2. Mild degenerative disc disease at T11-12 without stenosis. Otherwise no significant disc pathology, stenosis, or evidence for neural impingement. 3. Trace layering bilateral pleural effusions. Electronically Signed   By: Rise Mu M.D.   On: 02/06/2021 02:13   MR THORACIC SPINE W WO CONTRAST  Result Date: 02/06/2021 CLINICAL DATA:  Initial evaluation for cervical radiculopathy. History of HIV, syphilis, Guillain-Barre syndrome, leg weakness. EXAM: MRI CERVICAL AND THORACIC SPINE WITHOUT CONTRAST TECHNIQUE: Multiplanar and multiecho pulse sequences of the cervical spine, to include the craniocervical junction and cervicothoracic junction, and the thoracic spine, were obtained without intravenous contrast. COMPARISON:  None. Previous MRI from 07/29/2020 FINDINGS: MRI CERVICAL SPINE FINDINGS Alignment: Mild straightening of the normal cervical lordosis. Trace physiologic anterolisthesis of C7 on T1. Vertebrae: Vertebral body height maintained without acute or chronic fracture. Bone marrow signal intensity diffusely decreased on T1 weighted imaging, nonspecific, but most commonly related to anemia, smoking, or obesity. No discrete or  worrisome osseous lesions. No abnormal marrow edema or enhancement. Cord: Signal intensity within the cervical spinal cord is normal. A degree of underlying diffuse cord atrophy noted, similar to previous. No abnormal enhancement. No epidural collections. Posterior Fossa, vertebral arteries, paraspinal tissues: Visualized brain and posterior fossa within normal limits. Craniocervical junction normal. Paraspinous and prevertebral soft tissues within normal limits. Normal intravascular flow voids seen within  the vertebral arteries bilaterally. Disc levels: C2-C3: Unremarkable. C3-C4:  Unremarkable. C4-C5: Mild bilateral uncovertebral hypertrophy without significant disc bulge. No spinal stenosis. Foramina remain patent. C5-C6: Mild right-sided uncovertebral hypertrophy without significant disc bulge. No spinal stenosis. Mild right C6 foraminal narrowing. Left neural foramina remains patent. C6-C7: Mild right-sided uncovertebral hypertrophy without significant disc bulge. No spinal stenosis. Mild right C7 foraminal stenosis. Left neural foramina remains patent. C7-T1:  Unremarkable. MRI THORACIC SPINE FINDINGS Alignment: Trace scoliosis. Alignment otherwise normal with preservation of the normal thoracic kyphosis. No listhesis. Vertebrae: Vertebral body height maintained without acute or chronic fracture. Bone marrow signal intensity diffusely decreased on T1 weighted imaging, nonspecific, but most commonly related to anemia, smoking, or obesity. No discrete or worrisome osseous lesions. No abnormal marrow edema or enhancement. Cord: Signal intensity within the thoracic spinal cord is within normal limits. Degree of underlying diffuse cord atrophy noted. No abnormal enhancement. No epidural collections or other abnormality. Paraspinal and other soft tissues: Paraspinous soft tissues within normal limits. Trace layering bilateral pleural effusions noted. Visualized visceral structures otherwise grossly unremarkable. Disc levels: T11-12: Disc desiccation with mild discogenic reactive endplate change. No significant disc bulge or focal disc herniation. No stenosis. Otherwise, no other significant disc pathology seen within the thoracic spine. No other disc bulge or focal disc herniation. No other stenosis or neural impingement. IMPRESSION: MRI CERVICAL SPINE IMPRESSION: 1. 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 2. Right-sided uncovertebral hypertrophy at C5-6 and C6-7 with resultant mild right C6 and C7 foraminal  stenosis. 3. Otherwise unremarkable and normal MRI of the cervical spine. MRI THORACIC SPINE IMPRESSION: 1. 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 2. Mild degenerative disc disease at T11-12 without stenosis. Otherwise no significant disc pathology, stenosis, or evidence for neural impingement. 3. Trace layering bilateral pleural effusions. Electronically Signed   By: Rise MuBenjamin  McClintock M.D.   On: 02/06/2021 02:13        Scheduled Meds: . (feeding supplement) PROSource Plus  30 mL Oral BID BM  . bictegravir-emtricitabine-tenofovir AF  1 tablet Oral Daily  . enoxaparin (LOVENOX) injection  40 mg Subcutaneous Q24H  . feeding supplement  237 mL Oral BID BM  . fluconazole  200 mg Oral Daily  . folic acid  1 mg Oral Daily  . gabapentin  900 mg Oral TID  . levothyroxine  25 mcg Oral Q0600  . multivitamin with minerals  1 tablet Oral Daily  . sulfamethoxazole-trimethoprim  1 tablet Oral Once per day on Mon Wed Fri  . thiamine  100 mg Oral Daily   Or  . thiamine  100 mg Intravenous Daily  . traZODone  50 mg Oral QHS   Continuous Infusions: . Immune Globulin 10% Stopped (02/06/21 2320)     LOS: 4 days    Time spent: 40 minutes spent on chart review, discussion with nursing staff, consultants, updating family and interview/physical exam; more than 50% of that time was spent in counseling and/or coordination of care.    Alvira PhilipsEric J [place], DO [facility] Available via Epic secure chat 7am-7pm After these hours, please refer to coverage  provider listed on [email or web address] 02/07/2021, 12:10 PM

## 2021-02-07 NOTE — Progress Notes (Signed)
RN notified the IV team that the patient is to have IVIG infusion.

## 2021-02-07 NOTE — Progress Notes (Signed)
Physical Therapy Treatment Patient Details Name: Travis Mcgee MRN: [id number] DOB: 1980-11-13 Today's Date: 02/07/2021    History of Present Illness 40 year old male admitted for painful paresthesias of bilateral lower extremities in the setting of AIDS.  PMHx significant for HIV, prior syphilis and GERD and previous admission for bilateral leg weakness and paresthesias which he was treated for possible Guillain-Barr versus neurosyphilis.    PT Comments    Pt slow with movements and mobility.  Pt sat EOB and performed a few LE exercises and then ambulated in room.  Pt currently min/guard for safety however no physical assist required.  Pt encouraged to mobilize with nursing staff and sit EOB and perform exercises as tolerated.  RN also notified to encourage pt to ambulate in room.   Follow Up Recommendations  Home health PT     Equipment Recommendations  Rolling walker with 5" wheels    Recommendations for Other Services       Precautions / Restrictions Precautions Precautions: Fall    Mobility  Bed Mobility Overal bed mobility: Modified Independent                  Transfers Overall transfer level: Needs assistance Equipment used: Rolling walker (2 wheeled) Transfers: Sit to/from Stand Sit to Stand: Min guard         General transfer comment: cues for hand placement  Ambulation/Gait Ambulation/Gait assistance: Min guard Gait Distance (Feet): 48 Feet Assistive device: Rolling walker (2 wheeled) Gait Pattern/deviations: Step-through pattern;Decreased stride length;Narrow base of support     General Gait Details: small steps, narrow BOS, reliant on UE support of RW, min/guard for safety; no physical assist required   Stairs             Wheelchair Mobility    Modified Rankin (Stroke Patients Only)       Balance                                            Cognition Arousal/Alertness: Awake/alert Behavior During Therapy:  WFL for tasks assessed/performed Overall Cognitive Status: Within Functional Limits for tasks assessed                                        Exercises General Exercises - Lower Extremity Ankle Circles/Pumps: AROM;Both;10 reps;Seated Long Arc Quad: AROM;Both;10 reps;Seated Hip Flexion/Marching: AROM;Both;10 reps;Seated    General Comments        Pertinent Vitals/Pain Pain Assessment: Faces Faces Pain Scale: Hurts little more Pain Location: reports more pain in bil feet today Pain Descriptors / Indicators: Discomfort;Numbness Pain Intervention(s): Monitored during session;Repositioned;Premedicated before session (reports he had pain meds recently)    Home Living                      Prior Function            PT Goals (current goals can now be found in the care plan section) Progress towards PT goals: Progressing toward goals    Frequency    Min 3X/week      PT Plan Current plan remains appropriate    Co-evaluation              AM-PAC PT "6 Clicks" Mobility   Outcome Measure  Help needed turning from your  back to your side while in a flat bed without using bedrails?: A Little Help needed moving from lying on your back to sitting on the side of a flat bed without using bedrails?: A Little Help needed moving to and from a bed to a chair (including a wheelchair)?: A Little Help needed standing up from a chair using your arms (e.g., wheelchair or bedside chair)?: A Little Help needed to walk in hospital room?: A Little Help needed climbing 3-5 steps with a railing? : A Lot 6 Click Score: 17    End of Session   Activity Tolerance: Patient tolerated treatment well Patient left: with call bell/phone within reach;in bed Nurse Communication: Mobility status PT Visit Diagnosis: Other abnormalities of gait and mobility (R26.89)     Time: 2683-4196 PT Time Calculation (min) (ACUTE ONLY): 23 min  Charges:  $Gait Training: 8-22  mins $Therapeutic Exercise: 8-22 mins                     Thomasene Mohair PT, DPT Acute Rehabilitation Services Pager: [phone] Office: [phone]  Shamicka Inga,KATHrine E 02/07/2021, 1:16 PM

## 2021-02-07 NOTE — Plan of Care (Signed)
  Problem: Safety: Goal: Ability to remain free from injury will improve Outcome: Progressing   Problem: Pain Managment: Goal: General experience of comfort will improve Outcome: Progressing   Problem: Education: Goal: Knowledge of General Education information will improve Description: Including pain rating scale, medication(s)/side effects and non-pharmacologic comfort measures Outcome: Progressing   

## 2021-02-07 NOTE — Progress Notes (Signed)
MRI was negative for any type of myelopathic process. His exam was not really consistent with an acute neuropathic process, and therefore I think GBS is unlikely. He was started on IVIG which would confound both protein and cell count on LP results, so doubt this would be useful from this perspective, but could be used to test for syphilis with FTA-ABS or TPPA. HTLV would be another possibility though think this is unlikely. He did respond to IVIG previously, and had an elevated IgG index at that time, so repeating it is not completely unreasonable and I would finish out this course. I do not think I would be more aggressive with immune suppression, however, especially given he already meets criteria for AIDS.   HIV myelopathy is also certainly possible.   Another possibility is that this represents recrudescence in the setting of physiological stress(e.g. his covid).   Ritta Slot, MD [facility] [phone]  If 7pm- 7am, please page neurology on call as listed in AMION.
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
## 2021-02-08 DIAGNOSIS — G610 Guillain-Barre syndrome: Secondary | ICD-10-CM | POA: Diagnosis not present

## 2021-02-08 DIAGNOSIS — R7989 Other specified abnormal findings of blood chemistry: Secondary | ICD-10-CM | POA: Diagnosis not present

## 2021-02-08 DIAGNOSIS — B20 Human immunodeficiency virus [HIV] disease: Secondary | ICD-10-CM | POA: Diagnosis not present

## 2021-02-08 DIAGNOSIS — F1010 Alcohol abuse, uncomplicated: Secondary | ICD-10-CM | POA: Diagnosis not present

## 2021-02-08 DIAGNOSIS — R29898 Other symptoms and signs involving the musculoskeletal system: Secondary | ICD-10-CM | POA: Diagnosis not present

## 2021-02-08 LAB — COMPREHENSIVE METABOLIC PANEL WITH GFR
ALT: 44 U/L (ref 0–44)
AST: 51 U/L — ABNORMAL HIGH (ref 15–41)
Albumin: 2.3 g/dL — ABNORMAL LOW (ref 3.5–5.0)
Alkaline Phosphatase: 228 U/L — ABNORMAL HIGH (ref 38–126)
Anion gap: 6 (ref 5–15)
BUN: 10 mg/dL (ref 6–20)
CO2: 22 mmol/L (ref 22–32)
Calcium: 8.7 mg/dL — ABNORMAL LOW (ref 8.9–10.3)
Chloride: 106 mmol/L (ref 98–111)
Creatinine, Ser: 0.6 mg/dL — ABNORMAL LOW (ref 0.61–1.24)
GFR, Estimated: >60 mL/min
Glucose, Bld: 99 mg/dL (ref 70–99)
Potassium: 4.3 mmol/L (ref 3.5–5.1)
Sodium: 134 mmol/L — ABNORMAL LOW (ref 135–145)
Total Bilirubin: 0.5 mg/dL (ref 0.3–1.2)
Total Protein: 9.3 g/dL — ABNORMAL HIGH (ref 6.5–8.1)

## 2021-02-08 LAB — TSH: TSH: 1.82 u[IU]/mL (ref 0.350–4.500)

## 2021-02-08 LAB — MAGNESIUM: Magnesium: 1.7 mg/dL (ref 1.7–2.4)

## 2021-02-08 LAB — GLUCOSE, CAPILLARY: Glucose-Capillary: 129 mg/dL — ABNORMAL HIGH (ref 70–99)

## 2021-02-08 LAB — T4, FREE: Free T4: 0.89 ng/dL (ref 0.61–1.12)

## 2021-02-08 LAB — FOLATE: Folate: 15.7 ng/mL (ref >5.9)

## 2021-02-08 MED ORDER — THIAMINE HCL 100 MG PO TABS
100.0000 mg | ORAL_TABLET | Freq: Every day | ORAL | Status: DC
  Administered 2021-02-08 – 2021-02-11 (×4): 100 mg via ORAL
  Filled 2021-02-08 (×4): qty 1

## 2021-02-08 MED ORDER — MAGNESIUM SULFATE 2 GM/50ML IV SOLN
2.0000 g | Freq: Once | INTRAVENOUS | Status: AC
  Administered 2021-02-08: 2 g via INTRAVENOUS
  Filled 2021-02-08: qty 50

## 2021-02-08 MED ORDER — GABAPENTIN 300 MG PO CAPS
900.0000 mg | ORAL_CAPSULE | Freq: Four times a day (QID) | ORAL | Status: DC
  Administered 2021-02-08 – 2021-02-11 (×12): 900 mg via ORAL
  Filled 2021-02-08 (×11): qty 3

## 2021-02-08 NOTE — Plan of Care (Signed)
  Problem: Education: Goal: Knowledge of General Education information will improve Description: Including pain rating scale, medication(s)/side effects and non-pharmacologic comfort measures Outcome: Completed/Met

## 2021-02-08 NOTE — Evaluation (Signed)
Occupational Therapy Evaluation Patient Details Name: Travis Mcgee MRN: [id number] DOB: 01-29-1981 Today's Date: 02/08/2021    History of Present Illness 40 year old male admitted for painful paresthesias of bilateral lower extremities in the setting of AIDS.  PMHx significant for HIV, prior syphilis and GERD and previous admission for bilateral leg weakness and paresthesias which he was treated for possible Guillain-Barr versus neurosyphilis.   Clinical Impression   Travis Mcgee is a 40 year old man who presents with decreased sensation and weakness of lower extremities. On evaluation he reports continued sensation deficits of LEs but functional strength, normal strength of upper extremities and ability to perform bed transfers and ambulate in room without a device. Patient able to perform toilet transfer and perform all physical aspects needed for ADLs. Patient reports no need for OT and appears to be at his baseline. Patient wanting to be able to ambulate in room independently. No OT needs at this time.     Follow Up Recommendations  No OT follow up    Equipment Recommendations  None recommended by OT    Recommendations for Other Services       Precautions / Restrictions Precautions Precautions: Fall Restrictions Weight Bearing Restrictions: No      Mobility Bed Mobility Overal bed mobility: Modified Independent                  Transfers Overall transfer level: Needs assistance   Transfers: Sit to/from Stand           General transfer comment: Supervision to ambulate in room without a device and perform toilet transfer.    Balance Overall balance assessment: Mild deficits observed, not formally tested;History of Falls                                         ADL either performed or assessed with clinical judgement   ADL Overall ADL's : Modified independent                                       General ADL  Comments: Patient demonstrates independents with feeding, functional mobility, and ability to perform toilet transfer. Patient demonstrates the physical abilities to perform all aspects of ADLs. Reports no deficits with ADLs.     Vision Patient Visual Report: No change from baseline       Perception     Praxis      Pertinent Vitals/Pain Pain Assessment: Faces Faces Pain Scale: Hurts little more Pain Location: LEs Pain Descriptors / Indicators: Discomfort;Numbness Pain Intervention(s): Monitored during session;Premedicated before session     Hand Dominance Right   Extremity/Trunk Assessment Upper Extremity Assessment Upper Extremity Assessment: Overall WFL for tasks assessed (WFL ROM and 5/5 strength throughout)   Lower Extremity Assessment Lower Extremity Assessment: Defer to PT evaluation   Cervical / Trunk Assessment Cervical / Trunk Assessment: Normal   Communication Communication Communication: No difficulties   Cognition Arousal/Alertness: Awake/alert Behavior During Therapy: WFL for tasks assessed/performed Overall Cognitive Status: Within Functional Limits for tasks assessed                                     General Comments       Exercises  Shoulder Instructions      Home Living Family/patient expects to be discharged to:: Other (Comment)                                 Additional Comments: Patient reports he is going to discharge to [facility]. Reports some parts of the building have an elevator and other only accessed by steps. Reports some handicap amentities int the bathroom.      Prior Functioning/Environment Level of Independence: Independent                 OT Problem List: Impaired sensation      OT Treatment/Interventions:      OT Goals(Current goals can be found in the care plan section) Acute Rehab OT Goals OT Goal Formulation: All assessment and education complete, DC therapy  OT  Frequency:     Barriers to D/C:            Co-evaluation              AM-PAC OT "6 Clicks" Daily Activity     Outcome Measure Help from another person eating meals?: None Help from another person taking care of personal grooming?: None Help from another person toileting, which includes using toliet, bedpan, or urinal?: None Help from another person bathing (including washing, rinsing, drying)?: None Help from another person to put on and taking off regular upper body clothing?: None Help from another person to put on and taking off regular lower body clothing?: None 6 Click Score: 24   End of Session Nurse Communication:  (Okay to see)  Activity Tolerance: Patient tolerated treatment well Patient left: in bed;with call bell/phone within reach;with bed alarm set  OT Visit Diagnosis: Muscle weakness (generalized) (M62.81);Other symptoms and signs involving the nervous system (R29.898);History of falling (Z91.81)                Time: 7062-3762 OT Time Calculation (min): 15 min Charges:  OT General Charges $OT Visit: 1 Visit OT Evaluation $OT Eval Low Complexity: 1 Low  Marylouise Mallet, OTR/L Acute Care Rehab Services  Office [phone] Pager: [phone]   Kelli Churn 02/08/2021, 11:43 AM
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
## 2021-02-08 NOTE — Progress Notes (Signed)
PROGRESS NOTE    Travis Mcgee  AYT:[id number] DOB: 1981/03/17 DOA: 02/03/2021 PCP: Patient, No Pcp Per (Inactive)    Brief Narrative:  Travis Mcgee is a 40 year old [employer] veteran male with past medical history significant for HIV/AIDS, DM Bears syndrome, latent syphilis, homelessness who presented to [facility] ED on 02/03/2021 with complaints of nausea/vomiting and ascending bilateral lower extremity weakness/numbness for 2-3 weeks.  Patient with significant bilateral lower extremity weakness with falls associated with paresthesias and pain up to his abdomen.    Previously admitted October 2021 for concern of GBS versus neurosyphilis and treated with IVIG and IV penicillin.  In the ED, patient was found to be afebrile with mild tachycardia, no hypoxia.  Alkaline phosphatase 46, lipase 21, AST 182, ALP 103, total bilirubin 1.6.  Covid-19 PCR incidentally positive.  Abdominal ultrasound with probable hepatic steatosis otherwise unremarkable.  Patient received IV fentanyl, Zofran and 1 L LR bolus.  EDP consulted ID who recommended admission with GI consultation.  Hospital service consulted for further evaluation management.   Assessment & Plan:   Principal Problem:   HIV (human immunodeficiency virus infection) (HCC) Active Problems:   AIDS (acquired immune deficiency syndrome) (HCC)   History of syphilis   Guillain Barr syndrome (HCC)   Leg weakness, bilateral   Nausea & vomiting   Elevated LFTs   Alcohol abuse   Bilateral lower extremity weakness with associated pain and paresthesias Concern for Guillain-Barr syndrome Patient presenting with a sending paresthesias associated with weakness.  Reported history of DM Barr syndrome status post IVIG.  MR C/T-spine unrevealing.  B12 level elevated.  Neurology was consulted to follow during hospital course.  Patient completed 5-day course of IVIG.  Deferring LP given confound lab analysis, this was discussed with neurology and  infectious disease.  Given his poorly controlled HIV, HIV myelopathy also in the differential given his symptoms. --B1 level: Pending --Gabapentin 900 mg p.o. 4 times daily --Avoid narcotics --Continue PT/OT efforts  Nausea/vomiting 2/2 Candida esophagitis/thrush Patient was noted to have elevated LFTs with hepatic steatosis on ultrasound.  Gastroenterology recommended fluconazole for concern of possible fungal esophagitis/thrush.  Currently tolerating diet. --Fluconazole p.o. daily plan 14-day course with planned end date 02/18/2021 --Zofran as needed  Advanced HIV disease Patient reportedly off Biktarvy for several weeks.  CD4 count 51 and viral load 527,000 copies. --Continue Biktarvy --TMP-SMX for PCP prophylaxis M/W/F --ID follow-up outpatient  Hx secondary syphilis Originally diagnosed in urgent care February 2021 when he presented with a rash.  Treated with Bicillin x1.  RPR titer at that time 1: 256, repeat titer 1: 16.  Covid-19 viral infection, asymptomatic, incidental finding Patient received one-time dose of Pfizer vaccination.  Currently asymptomatic. --Continue supportive care  Transaminitis Patient with notable elevation in AST/ALT, suspect secondary to chronic EtOH abuse.  Also with hepatitis B core antibody, hepatitis C antibody positive. --LFTs improving --Discussed with patient need for complete abstinence from alcohol  Hypernatremia in the setting of poor oral intake with nausea/vomiting, POA --Encourage increase oral intake --EtOH cessation  Hypokalemia Potassium 4.3 this morning.  Hypomagnesemia Magnesium 1.7, will replete. --Repeat electrolytes in a.m.   DVT prophylaxis: Lovenox   Code Status: Full Code Family Communication: Updated patient extensively at bedside  Disposition Plan:  Level of care: Med-Surg Status is: Inpatient  Remains inpatient appropriate because:Unsafe d/c plan, IV treatments appropriate due to intensity of illness or  inability to take PO and Inpatient level of care appropriate due to severity of illness  Dispo: The patient is from: Home              Anticipated d/c is to: [facility]              Patient currently is not medically stable to d/c.   Difficult to place patient No    Consultants:   Infectious disease  [facility] - signed off 4/12  Neurology  Procedures:   None  Antimicrobials:   Fluconazole 4/11>> (End 4/25)   Subjective: Patient seen and examined bedside, resting comfortably.  Continues with lower extremity weakness, pain; slowly improving. No other concerns or complaints per patient. Denies headache, no fever/chills/night sweats, no nausea/vomiting/diarrhea, no chest pain, palpitations, no shortness of breath, no abdominal pain.  No acute concerns overnight per nursing staff.  [facility]  Objective: Vitals:   02/07/21 2205 02/07/21 2300 02/07/21 2332 02/08/21 0623  BP: 124/90 (!) 107/94 107/73 107/81  Pulse: 65 67 84 71  Resp: 18 16 16 18   Temp: 98.5 F (36.9 C) 98.5 F (36.9 C) 98.6 F (37 C) 98.2 F (36.8 C)  TempSrc: Oral Oral Oral Oral  SpO2: 100% 99% 97%   Weight:      Height:        Intake/Output Summary (Last 24 hours) at 02/08/2021 1005 Last data filed at 02/07/2021 2239 Gross per 24 hour  Intake 600 ml  Output 1250 ml  Net -650 ml   Filed Weights   02/03/21 1749  Weight: 63.8 kg    Examination:  General exam: Appears calm and comfortable  Respiratory system: Clear to auscultation. Respiratory effort normal.  On room air Cardiovascular system: S1 & S2 heard, RRR. No JVD, murmurs, rubs, gallops or clicks. No pedal edema. Gastrointestinal system: Abdomen is nondistended, soft and nontender. No organomegaly or masses felt. Normal bowel sounds heard. Central nervous system: Alert and oriented. No focal neurological deficits. Extremities: Bilateral lower  extremities for 4+/5, otherwise muscle strength preserved Skin: No rashes, lesions or ulcers Psychiatry: Judgement and insight appear normal. Mood & affect appropriate.     Data Reviewed: I have personally reviewed following labs and imaging studies  CBC: Recent Labs  Lab 02/03/21 1036 02/04/21 0226 02/05/21 0637 02/06/21 0346  WBC 3.6* 2.9* 4.0 3.4*  NEUTROABS 2.4  --   --   --   HGB 13.5 11.5* 10.5* 11.6*  HCT 40.7 34.8* 32.5* 35.8*  MCV 101.0* 102.7* 105.2* 105.3*  PLT 179 146* 154 155   Basic Metabolic Panel: Recent Labs  Lab 02/04/21 0226 02/05/21 0637 02/06/21 0346 02/07/21 0342 02/08/21 0334  NA 130* 128* 133* 134* 134*  K 3.5 3.6 3.3* 4.1 4.3  CL 94* 97* 99 103 106  CO2 28 24 25 25 22   GLUCOSE 86 99 97 98 99  BUN <5* 6 6 7 10   CREATININE 0.62 0.56* 0.64 0.68 0.60*  CALCIUM 7.8* 7.7* 8.0* 8.4* 8.7*  MG 1.5*  --   --  1.6* 1.7  PHOS 4.0  --   --   --   --    GFR: Estimated Creatinine Clearance: 109.7 mL/min (A) (by C-G formula based on SCr of 0.6 mg/dL (L)). Liver Function Tests: Recent Labs  Lab 02/04/21 0226 02/05/21 0637 02/06/21 0346 02/07/21 0342 02/08/21 0334  AST 154* 133* 73* 61* 51*  ALT 80* 72* 57* 49* 44  ALKPHOS 370* 309* 295* 253* 228*  BILITOT 1.4* 0.7 0.2* 0.7 0.5  PROT  7.6 7.6 7.9 9.0* 9.3*  ALBUMIN 2.0* 2.0* 2.0* 2.2* 2.3*   Recent Labs  Lab 02/03/21 1036  LIPASE 21   No results for input(s): AMMONIA in the last 168 hours. Coagulation Profile: No results for input(s): INR, PROTIME in the last 168 hours. Cardiac Enzymes: No results for input(s): CKTOTAL, CKMB, CKMBINDEX, TROPONINI in the last 168 hours. BNP (last 3 results) No results for input(s): PROBNP in the last 8760 hours. HbA1C: No results for input(s): HGBA1C in the last 72 hours. CBG: No results for input(s): GLUCAP in the last 168 hours. Lipid Profile: No results for input(s): CHOL, HDL, LDLCALC, TRIG, CHOLHDL, LDLDIRECT in the last 72 hours. Thyroid Function  Tests: No results for input(s): TSH, T4TOTAL, FREET4, T3FREE, THYROIDAB in the last 72 hours. Anemia Panel: No results for input(s): VITAMINB12, FOLATE, FERRITIN, TIBC, IRON, RETICCTPCT in the last 72 hours. Sepsis Labs: No results for input(s): PROCALCITON, LATICACIDVEN in the last 168 hours.  Recent Results (from the past 240 hour(s))  Resp Panel by RT-PCR (Flu A&B, Covid) Nasopharyngeal Swab     Status: Abnormal   Collection Time: 02/03/21  3:40 PM   Specimen: Nasopharyngeal Swab; Nasopharyngeal(NP) swabs in vial transport medium  Result Value Ref Range Status   SARS Coronavirus 2 by RT PCR POSITIVE (A) NEGATIVE Final    Comment: CRITICAL RESULT CALLED TO, READ BACK BY AND VERIFIED WITH: AMELIA WOART RN 02/03/21 @1924  BY P.HENDERSON    Influenza A by PCR NEGATIVE NEGATIVE Final   Influenza B by PCR NEGATIVE NEGATIVE Final    Comment: (NOTE) The Xpert Xpress SARS-CoV-2/FLU/RSV plus assay is intended as an aid in the diagnosis of influenza from Nasopharyngeal swab specimens and should not be used as a sole basis for treatment. Nasal washings and aspirates are unacceptable for Xpert Xpress SARS-CoV-2/FLU/RSV testing.  Fact Sheet for Patients:  Fact Sheet for Healthcare Providers: BloggerCourse.com  This test is not yet approved or cleared by the [employer] FDA and has been authorized for detection and/or diagnosis of SARS-CoV-2 by FDA under an Emergency Use Authorization (EUA). This EUA will remain in effect (meaning this test can be used) for the duration of the COVID-19 declaration under Section 564(b)(1) of the Act, 21 U.S.C. section 360bbb-3(b)(1), unless the authorization is terminated or revoked.  Performed at [facility], [address], [place], [place]   Acid Fast Smear (AFB)     Status: None   Collection Time: 02/04/21  4:11 PM   Specimen: Vein; Blood  Result Value  Ref Range Status   AFB Specimen Processing WRORD  Final    Comment: (NOTE) Test not performed. The required specimen for the test ordered was not received.      Unable to perform on whole blood.      04/06/21 T. was notified 02/05/2021.    Acid Fast Smear NOT PERFORMED  Final    Comment: (NOTE) Test not performed Performed At: [facility] [address] [place], [place] [id number] MD Jolene Schimke    Source (AFB) BLOOD  Final    Comment: Performed at [facility], [address] [place], [place]         Radiology Studies: No results found.      Scheduled Meds: . (feeding supplement) PROSource Plus  30 mL Oral BID BM  . bictegravir-emtricitabine-tenofovir AF  1 tablet Oral Daily  . enoxaparin (LOVENOX) injection  40 mg Subcutaneous Q24H  . feeding supplement  237  mL Oral BID BM  . fluconazole  200 mg Oral Daily  . folic acid  1 mg Oral Daily  . gabapentin  900 mg Oral QID  . levothyroxine  25 mcg Oral Q0600  . multivitamin with minerals  1 tablet Oral Daily  . sulfamethoxazole-trimethoprim  1 tablet Oral Once per day on Mon Wed Fri  . thiamine  100 mg Oral Daily  . traZODone  50 mg Oral QHS   Continuous Infusions:    LOS: 5 days    Time spent: 39 minutes spent on chart review, discussion with nursing staff, consultants, updating family and interview/physical exam; more than 50% of that time was spent in counseling and/or coordination of care.    Alvira Philips [place], DO [facility] Available via Epic secure chat 7am-7pm After these hours, please refer to coverage provider listed on [email or web address] 02/08/2021, 10:05 AM

## 2021-02-09 DIAGNOSIS — B20 Human immunodeficiency virus [HIV] disease: Secondary | ICD-10-CM | POA: Diagnosis not present

## 2021-02-09 LAB — COMPREHENSIVE METABOLIC PANEL
ALT: 42 U/L (ref 0–44)
AST: 51 U/L — ABNORMAL HIGH (ref 15–41)
Albumin: 2.5 g/dL — ABNORMAL LOW (ref 3.5–5.0)
Alkaline Phosphatase: 212 U/L — ABNORMAL HIGH (ref 38–126)
Anion gap: 8 (ref 5–15)
BUN: 12 mg/dL (ref 6–20)
CO2: 24 mmol/L (ref 22–32)
Calcium: 8.9 mg/dL (ref 8.9–10.3)
Chloride: 102 mmol/L (ref 98–111)
Creatinine, Ser: 0.66 mg/dL (ref 0.61–1.24)
GFR, Estimated: >60 mL/min (ref >60)
Glucose, Bld: 84 mg/dL (ref 70–99)
Potassium: 4.5 mmol/L (ref 3.5–5.1)
Sodium: 134 mmol/L — ABNORMAL LOW (ref 135–145)
Total Bilirubin: 0.5 mg/dL (ref 0.3–1.2)
Total Protein: 9.1 g/dL — ABNORMAL HIGH (ref 6.5–8.1)

## 2021-02-09 LAB — MAGNESIUM: Magnesium: 1.8 mg/dL (ref 1.7–2.4)

## 2021-02-09 LAB — PREALBUMIN: Prealbumin: 17.7 mg/dL — ABNORMAL LOW (ref 18–38)

## 2021-02-09 NOTE — Progress Notes (Signed)
PROGRESS NOTE [facility]   Travis Mcgee   LFY:[id number] DOB: 25-Nov-1980  DOA: 02/03/2021 PCP: Patient, No Pcp Per (Inactive)   Brief Narrative:  Travis Mcgee is a 40 year old [employer] veteran male with past medical history significant for HIV/AIDS, DM Bears syndrome, latent syphilis, homelessness who presented to [facility] on 02/03/2021 with complaints of nausea/vomiting and ascending bilateral lower extremity weakness/numbness for 2-3 weeks.  Patient with significant bilateral lower extremity weakness with falls associated with paresthesias and pain up to his abdomen.    Previously admitted October 2021 for concern of GBS versus neurosyphilis and treated with IVIG and IV penicillin.  In the ED, patient was found to be afebrile with mild tachycardia, no hypoxia.  Alkaline phosphatase 46, lipase 21, AST 182, ALP 103, total bilirubin 1.6.  Covid-19 PCR incidentally positive.  Abdominal ultrasound with probable hepatic steatosis otherwise unremarkable.  Patient received IV fentanyl, Zofran and 1 L LR bolus.  EDP consulted ID who recommended admission with GI consultation.  Hospital service consulted for further evaluation management.  Subjective: Patient seen and examined, he reported no major issues overnight.  He is tolerating p.o. diet well, he is not happy with bed alarm.  His pain is okay controlled with current medications.  No acute events reported by nursing staff.  Assessment & Plan:   Principal Problem:   HIV (human immunodeficiency virus infection) (HCC) Active Problems:   AIDS (acquired immune deficiency syndrome) (HCC)   History of syphilis   Guillain Barr syndrome (HCC)   Leg weakness, bilateral   Nausea & vomiting   Elevated LFTs   Alcohol abuse    Bilateral lower extremity weakness with associated pain and paresthesias Concern for Guillain-Barr syndrome Patient presenting with a sending paresthesias associated with weakness. Reported history of DM  Barr syndrome status post IVIG.  MR C/T-spine unrevealing.  B12 level elevated.  Neurology was consulted to follow during hospital course.  Patient completed 5-day course of IVIG.  LP was differred given confound lab analysis, this was discussed with neurology and infectious disease.  Given his poorly controlled HIV, HIV myelopathy also in the differential given his symptoms. --B1 level: Still pending  --Gabapentin 900 mg p.o. 4 times daily, he feels that this is not controlling bowel symptoms, will consider adding Celebrex --Avoid narcotics --PT recommending home health PT, OT no further follow-up.  Nausea/vomiting 2/2 Candida esophagitis/thrush Patient was noted to have elevated LFTs with hepatic steatosis on ultrasound.  Gastroenterology recommended fluconazole for concern of possible fungal esophagitis/thrush.  Currently tolerating diet. --Fluconazole p.o. daily plan 14-day course with planned end date 02/18/2021 --Zofran as needed  Advanced HIV disease Patient reportedly off Biktarvy for several weeks.  CD4 count 51 and viral load 527,000 copies. --Continue Biktarvy --TMP-SMX for PCP prophylaxis M/W/F --ID follow-up outpatient  Hx secondary syphilis Originally diagnosed in urgent care February 2021 when he presented with a rash.  Treated with Bicillin x1.  RPR titer at that time 1: 256, repeat titer 1: 16.  Covid-19 viral infection, asymptomatic, incidental finding Patient received one-time dose of Pfizer vaccination.  Currently asymptomatic. --Continue supportive care  Transaminitis Patient with notable elevation in AST/ALT, suspect secondary to chronic EtOH abuse.  Also with hepatitis B core antibody, hepatitis C antibody positive. --LFTs improved --Alcohol cessation discussed  Hypernatremia in the setting of poor oral intake with nausea/vomiting, POA --Likely beer potomania.  --Sodium stable at 134.  Hypokalemia - resolved Hypomagnesemia -resolved   DVT  prophylaxis: Lovenox Code Status: Full code Family  Communication: None at bedside Disposition Plan: Anticipate DC to homeless shelter) on 4/18.   Consultants:   ID  GI  Neurology  Procedures:   None  Antimicrobials:  Diflucan 4/11 - end date 4/25   Objective: Vitals:   02/08/21 2010 02/09/21 0500 02/09/21 0853 02/09/21 1431  BP: 103/71 (!) 96/54 104/77 104/74  Pulse: 70 86 70 80  Resp: 18 18  16   Temp: 99 F (37.2 C) 98.2 F (36.8 C)  98.5 F (36.9 C)  TempSrc: Oral Oral  Oral  SpO2: 100% 100%  100%  Weight:      Height:        Intake/Output Summary (Last 24 hours) at 02/09/2021 1750 Last data filed at 02/09/2021 1437 Gross per 24 hour  Intake 1680 ml  Output 1375 ml  Net 305 ml   Filed Weights   02/03/21 1749  Weight: 63.8 kg    Examination:  General exam: Appears calm and comfortable  HEENT: AC/AT, PERRLA, OP moist and clear Respiratory system: Clear to auscultation. No wheezes,crackle or rhonchi Cardiovascular system: S1 & S2 heard, RRR. No JVD, murmurs, rubs or gallops Gastrointestinal system: Abdomen is nondistended, soft and nontender. No organomegaly or masses felt. Normal bowel sounds heard. Central nervous system: Alert and oriented. No focal neurological deficits. Extremities: No pedal edema. Symmetric, strength 5/5   Skin: No rashes, lesions or ulcers Psychiatry: Judgement and insight appear normal. Mood & affect appropriate.    Data Reviewed: I have personally reviewed following labs and imaging studies  CBC: Recent Labs  Lab 02/03/21 1036 02/04/21 0226 02/05/21 0637 02/06/21 0346  WBC 3.6* 2.9* 4.0 3.4*  NEUTROABS 2.4  --   --   --   HGB 13.5 11.5* 10.5* 11.6*  HCT 40.7 34.8* 32.5* 35.8*  MCV 101.0* 102.7* 105.2* 105.3*  PLT 179 146* 154 155   Basic Metabolic Panel: Recent Labs  Lab 02/04/21 0226 02/05/21 0637 02/06/21 0346 02/07/21 0342 02/08/21 0334 02/09/21 0329  NA 130* 128* 133* 134* 134* 134*  K 3.5 3.6 3.3*  4.1 4.3 4.5  CL 94* 97* 99 103 106 102  CO2 28 24 25 25 22 24   GLUCOSE 86 99 97 98 99 84  BUN <5* 6 6 7 10 12   CREATININE 0.62 0.56* 0.64 0.68 0.60* 0.66  CALCIUM 7.8* 7.7* 8.0* 8.4* 8.7* 8.9  MG 1.5*  --   --  1.6* 1.7 1.8  PHOS 4.0  --   --   --   --   --    GFR: Estimated Creatinine Clearance: 109.7 mL/min (by C-G formula based on SCr of 0.66 mg/dL). Liver Function Tests: Recent Labs  Lab 02/05/21 0637 02/06/21 0346 02/07/21 0342 02/08/21 0334 02/09/21 0329  AST 133* 73* 61* 51* 51*  ALT 72* 57* 49* 44 42  ALKPHOS 309* 295* 253* 228* 212*  BILITOT 0.7 0.2* 0.7 0.5 0.5  PROT 7.6 7.9 9.0* 9.3* 9.1*  ALBUMIN 2.0* 2.0* 2.2* 2.3* 2.5*   Recent Labs  Lab 02/03/21 1036  LIPASE 21   No results for input(s): AMMONIA in the last 168 hours. Coagulation Profile: No results for input(s): INR, PROTIME in the last 168 hours. Cardiac Enzymes: No results for input(s): CKTOTAL, CKMB, CKMBINDEX, TROPONINI in the last 168 hours. BNP (last 3 results) No results for input(s): PROBNP in the last 8760 hours. HbA1C: No results for input(s): HGBA1C in the last 72 hours. CBG: Recent Labs  Lab 02/08/21 1224  GLUCAP 129*   Lipid Profile: No  results for input(s): CHOL, HDL, LDLCALC, TRIG, CHOLHDL, LDLDIRECT in the last 72 hours. Thyroid Function Tests: Recent Labs    02/08/21 1026  TSH 1.820  FREET4 0.89   Anemia Panel: Recent Labs    02/08/21 1656  FOLATE 15.7   Sepsis Labs: No results for input(s): PROCALCITON, LATICACIDVEN in the last 168 hours.  Recent Results (from the past 240 hour(s))  Resp Panel by RT-PCR (Flu A&B, Covid) Nasopharyngeal Swab     Status: Abnormal   Collection Time: 02/03/21  3:40 PM   Specimen: Nasopharyngeal Swab; Nasopharyngeal(NP) swabs in vial transport medium  Result Value Ref Range Status   SARS Coronavirus 2 by RT PCR POSITIVE (A) NEGATIVE Final    Comment: CRITICAL RESULT CALLED TO, READ BACK BY AND VERIFIED WITH: AMELIA WOART RN 02/03/21  @1924  BY P.HENDERSON    Influenza A by PCR NEGATIVE NEGATIVE Final   Influenza B by PCR NEGATIVE NEGATIVE Final    Comment: (NOTE) The Xpert Xpress SARS-CoV-2/FLU/RSV plus assay is intended as an aid in the diagnosis of influenza from Nasopharyngeal swab specimens and should not be used as a sole basis for treatment. Nasal washings and aspirates are unacceptable for Xpert Xpress SARS-CoV-2/FLU/RSV testing.  Fact Sheet for Patients:  Fact Sheet for Healthcare Providers: BloggerCourse.com  This test is not yet approved or cleared by the [employer] FDA and has been authorized for detection and/or diagnosis of SARS-CoV-2 by FDA under an Emergency Use Authorization (EUA). This EUA will remain in effect (meaning this test can be used) for the duration of the COVID-19 declaration under Section 564(b)(1) of the Act, 21 U.S.C. section 360bbb-3(b)(1), unless the authorization is terminated or revoked.  Performed at [facility], [address] [place], [place]   Acid Fast Smear (AFB)     Status: None   Collection Time: 02/04/21  4:11 PM   Specimen: Vein; Blood  Result Value Ref Range Status   AFB Specimen Processing WRORD  Final    Comment: (NOTE) Test not performed. The required specimen for the test ordered was not received.      Unable to perform on whole blood.      04/06/21 T. was notified 02/05/2021.    Acid Fast Smear NOT PERFORMED  Final    Comment: (NOTE) Test not performed Performed At: [facility] [address] [place], [place] [id number] MD Jolene Schimke    Source (AFB) BLOOD  Final    Comment: Performed at [facility], [address] [place], [place]      Radiology Studies: No results found.    Scheduled Meds: . (feeding supplement) PROSource Plus  30 mL Oral BID BM  . bictegravir-emtricitabine-tenofovir  AF  1 tablet Oral Daily  . enoxaparin (LOVENOX) injection  40 mg Subcutaneous Q24H  . feeding supplement  237 mL Oral BID BM  . fluconazole  200 mg Oral Daily  . folic acid  1 mg Oral Daily  . gabapentin  900 mg Oral QID  . levothyroxine  25 mcg Oral Q0600  . multivitamin with minerals  1 tablet Oral Daily  . sulfamethoxazole-trimethoprim  1 tablet Oral Once per day on Mon Wed Fri  . thiamine  100 mg Oral Daily  . traZODone  50 mg Oral QHS   Continuous Infusions:   LOS: 6 days    Time spent: Total of 25 minutes spent with pt, greater than 50% of which was spent in discussion of  treatment, counseling and coordination of care    Latrelle DodrillEdwin Silva, MD  How to contact the [facility] Attending or Consulting provider 7A - 7P or covering provider during after hours 7P -7A, for this patient?  1. Check the care team in [facility] and look for a) attending/consulting [facility] provider listed and b) the [facility] team listed 2. Log into [email or web address] and use [facility]'s universal password to access. If you do not have the password, please contact the hospital operator. 3. Locate the [facility] provider you are looking for under [facility] and page to a number that you can be directly reached. 4. If you still have difficulty reaching the provider, please page the [facility] (Director on Call) for the Hospitalists listed on amion for assistance.

## 2021-02-10 DIAGNOSIS — R7989 Other specified abnormal findings of blood chemistry: Secondary | ICD-10-CM | POA: Diagnosis not present

## 2021-02-10 DIAGNOSIS — F1010 Alcohol abuse, uncomplicated: Secondary | ICD-10-CM | POA: Diagnosis not present

## 2021-02-10 DIAGNOSIS — G610 Guillain-Barre syndrome: Secondary | ICD-10-CM | POA: Diagnosis not present

## 2021-02-10 DIAGNOSIS — B20 Human immunodeficiency virus [HIV] disease: Secondary | ICD-10-CM | POA: Diagnosis not present

## 2021-02-10 LAB — CBC WITH DIFFERENTIAL/PLATELET
Abs Immature Granulocytes: 0.01 10*3/uL (ref 0.00–0.07)
Basophils Absolute: 0.1 10*3/uL (ref 0.0–0.1)
Basophils Relative: 2 %
Eosinophils Absolute: 0.2 10*3/uL (ref 0.0–0.5)
Eosinophils Relative: 5 %
HCT: 38.9 % — ABNORMAL LOW (ref 39.0–52.0)
Hemoglobin: 12.6 g/dL — ABNORMAL LOW (ref 13.0–17.0)
Immature Granulocytes: 0 %
Lymphocytes Relative: 44 %
Lymphs Abs: 1.5 10*3/uL (ref 0.7–4.0)
MCH: 34.2 pg — ABNORMAL HIGH (ref 26.0–34.0)
MCHC: 32.4 g/dL (ref 30.0–36.0)
MCV: 105.7 fL — ABNORMAL HIGH (ref 80.0–100.0)
Monocytes Absolute: 0.5 10*3/uL (ref 0.1–1.0)
Monocytes Relative: 14 %
Neutro Abs: 1.2 10*3/uL — ABNORMAL LOW (ref 1.7–7.7)
Neutrophils Relative %: 35 %
Platelets: 255 10*3/uL (ref 150–400)
RBC: 3.68 MIL/uL — ABNORMAL LOW (ref 4.22–5.81)
RDW: 17.6 % — ABNORMAL HIGH (ref 11.5–15.5)
WBC: 3.5 10*3/uL — ABNORMAL LOW (ref 4.0–10.5)
nRBC: 0 % (ref 0.0–0.2)

## 2021-02-10 LAB — BASIC METABOLIC PANEL
Anion gap: 8 (ref 5–15)
BUN: 16 mg/dL (ref 6–20)
CO2: 23 mmol/L (ref 22–32)
Calcium: 9 mg/dL (ref 8.9–10.3)
Chloride: 104 mmol/L (ref 98–111)
Creatinine, Ser: 0.77 mg/dL (ref 0.61–1.24)
GFR, Estimated: >60 mL/min (ref >60)
Glucose, Bld: 93 mg/dL (ref 70–99)
Potassium: 4.6 mmol/L (ref 3.5–5.1)
Sodium: 135 mmol/L (ref 135–145)

## 2021-02-10 LAB — MAGNESIUM: Magnesium: 1.8 mg/dL (ref 1.7–2.4)

## 2021-02-10 MED ORDER — CELECOXIB 200 MG PO CAPS
200.0000 mg | ORAL_CAPSULE | Freq: Two times a day (BID) | ORAL | Status: DC | PRN
  Administered 2021-02-10: 200 mg via ORAL
  Filled 2021-02-10 (×2): qty 1

## 2021-02-10 MED ORDER — DIPHENHYDRAMINE HCL 25 MG PO CAPS
25.0000 mg | ORAL_CAPSULE | Freq: Once | ORAL | Status: AC
  Administered 2021-02-10: 25 mg via ORAL
  Filled 2021-02-10: qty 1

## 2021-02-10 NOTE — Progress Notes (Signed)
Physical Therapy Treatment Patient Details Name: Travis Mcgee MRN: [id number] DOB: [birth year] Today's Date: 02/10/2021    History of Present Illness 40 year old male admitted for painful paresthesias of bilateral lower extremities in the setting of AIDS.  PMHx significant for HIV, prior syphilis and GERD and previous admission for bilateral leg weakness and paresthesias which he was treated for possible Guillain-Barr versus neurosyphilis.    PT Comments    Pt reports feeling much better overall; He feels more secure during amb  with unilateral UE support even though not heavily reliant on it, he will benefit from use of [facility] for gait stability.  Pt has been up mobilizing in room on his own, RN confirms.  No further PT needs at  this time, will sign off.   Follow Up Recommendations  No PT follow up     Equipment Recommendations  Cane    Recommendations for Other Services       Precautions / Restrictions Restrictions Weight Bearing Restrictions: No    Mobility  Bed Mobility Overal bed mobility: Independent                  Transfers Overall transfer level: Independent Equipment used: None             General transfer comment: no assist to stand  Ambulation/Gait Ambulation/Gait assistance: Independent;Modified independent (Device/Increase time) Gait Distance (Feet): 25 Feet (in room) Assistive device: None;1 person hand held assist Gait Pattern/deviations: Step-through pattern;Narrow base of support     General Gait Details: HHA for half of distance to simulate SPC. pt reports feeling more secure with HHA however not reliant on therapist for balance   Stairs             Wheelchair Mobility    Modified Rankin (Stroke Patients Only)       Balance                                            Cognition Arousal/Alertness: Awake/alert Behavior During Therapy: WFL for tasks assessed/performed Overall Cognitive Status: Within  Functional Limits for tasks assessed                                        Exercises      General Comments        Pertinent Vitals/Pain Pain Assessment: Faces Faces Pain Scale: Hurts a little bit Pain Location: LEs Pain Descriptors / Indicators: Discomfort;Numbness;Tingling Pain Intervention(s): Limited activity within patient's tolerance;Monitored during session    Home Living                      Prior Function            PT Goals (current goals can now be found in the care plan section) Acute Rehab PT Goals PT Goal Formulation: With patient Time For Goal Achievement: 02/19/21 Potential to Achieve Goals: Good Progress towards PT goals: Progressing toward goals    Frequency           PT Plan Frequency needs to be updated;Other (comment) (d/c PT)    Co-evaluation              AM-PAC PT "6 Clicks" Mobility   Outcome Measure  Help needed turning from your back to your side while  in a flat bed without using bedrails?: None Help needed moving from lying on your back to sitting on the side of a flat bed without using bedrails?: None Help needed moving to and from a bed to a chair (including a wheelchair)?: None Help needed standing up from a chair using your arms (e.g., wheelchair or bedside chair)?: None Help needed to walk in hospital room?: None Help needed climbing 3-5 steps with a railing? : A Little 6 Click Score: 23    End of Session   Activity Tolerance: Patient tolerated treatment well Patient left: with call bell/phone within reach;in bed   PT Visit Diagnosis: Other abnormalities of gait and mobility (R26.89)     Time: 6659-9357 PT Time Calculation (min) (ACUTE ONLY): 18 min  Charges:  $Gait Training: 8-22 mins                     Delice Bison, PT  Acute Rehab Dept ([facility]) [phone] Pager [phone]  02/10/2021    [facility] 02/10/2021, 3:38 PM

## 2021-02-10 NOTE — Progress Notes (Signed)
PROGRESS NOTE [facility]   [name]   GQQ:[id number] DOB: [birth year]  DOA: 02/03/2021 PCP: Patient, No Pcp Per (Inactive)   Brief Narrative:  Travis Mcgee is a 40 year old [employer] veteran male with past medical history significant for HIV/AIDS, Guillain Barre syndrome, latent syphilis, homelessness who presented to [facility] on 02/03/2021 with complaints of nausea/vomiting and ascending bilateral lower extremity weakness/numbness for 2-3 weeks.  Patient with significant bilateral lower extremity weakness with falls associated with paresthesias and pain up to his abdomen. Previously admitted October 2021 for concern of GBS versus neurosyphilis and treated with IVIG and IV penicillin. In the ED, patient was found to be afebrile with mild tachycardia, no hypoxia.  Alkaline phosphatase 46, lipase 21, AST 182, ALP 103, total bilirubin 1.6.  Covid-19 PCR incidentally positive.  Abdominal ultrasound with probable hepatic steatosis otherwise unremarkable.  Patient received IV fentanyl, Zofran and 1 L LR bolus.  EDP consulted ID who recommended admission with GI consultation.  Hospital service consulted for further evaluation management.  Subjective: Patient seen and examined, he reported no major issues overnight.  He is tolerating p.o. diet well, he is not happy with bed alarm.  His pain is okay controlled with current medications.  No acute events reported by nursing staff.  Assessment & Plan:   Principal Problem:   HIV (human immunodeficiency virus infection) (HCC) Active Problems:   AIDS (acquired immune deficiency syndrome) (HCC)   History of syphilis   Guillain Barr syndrome (HCC)   Leg weakness, bilateral   Nausea & vomiting   Elevated LFTs   Alcohol abuse   Bilateral lower extremity weakness with associated pain and paresthesias Concern for Guillain-Barr syndrome Patient presenting with a sending paresthesias associated with weakness. Reported history of Guillain  Barr syndrome status post IVIG.  MR C/T-spine unrevealing.  B12 level elevated.  Neurology was consulted to follow during hospital course.  Patient completed 5-day course of IVIG.  LP was differred given confound lab analysis, this was discussed with neurology and infectious disease.  Given his poorly controlled HIV, HIV myelopathy also in the differential given his symptoms. --B1/Thiamine level: Still pending. Folate 15.7; B12 946 (these will all be post vitamin supplementation so likely of limited value - does have history of profound vitamin deficiency) --Gabapentin 900 mg p.o. 4 times daily, add Celebrex --Avoid narcotics - hydrocodone DC --PT recommending home health PT, OT no further follow-up.  Nausea/vomiting 2/2 Candida esophagitis/thrush Patient was noted to have elevated LFTs with hepatic steatosis on ultrasound.  Gastroenterology recommended fluconazole for concern of possible fungal esophagitis/thrush.  Currently tolerating diet. --Fluconazole p.o. daily plan 14-day course with planned end date 02/18/2021 --Zofran as needed  Advanced HIV disease Patient reportedly off Biktarvy for several weeks.  CD4 count 51 and viral load 527,000 copies. --Continue Biktarvy --TMP-SMX for PCP prophylaxis M/W/F --ID follow-up outpatient  Hx secondary syphilis Originally diagnosed in urgent care February 2021 when he presented with a rash.  Treated with Bicillin x1.  RPR titer at that time 1: 256, repeat titer 1: 16.  Covid-19 viral infection, asymptomatic, incidental finding Patient received one-time dose of Pfizer vaccination.  Currently asymptomatic. --Continue supportive care  Transaminitis Patient with notable elevation in AST/ALT, suspect secondary to chronic EtOH abuse.  Also with hepatitis B core antibody, hepatitis C antibody positive. --LFTs improved --Alcohol cessation discussed  Hypernatremia in the setting of poor oral intake with nausea/vomiting, POA --Likely beer  potomania.  --Sodium stable at 134.  Hypokalemia - resolved Hypomagnesemia -resolved  DVT prophylaxis: Lovenox Code Status: Full code Family Communication: None at bedside Disposition Plan: Anticipate DC to homeless shelter in [place] on 4/18.   Consultants:   ID  GI  Neurology  Procedures:   None  Antimicrobials:  Diflucan 4/11 - end date 4/25   Objective: Vitals:   02/09/21 0853 02/09/21 1431 02/09/21 2126 02/10/21 0645  BP: 104/77 104/74 104/69 92/62  Pulse: 70 80 90 90  Resp:  16 16 16   Temp:  98.5 F (36.9 C) 99.2 F (37.3 C) 99.1 F (37.3 C)  TempSrc:  Oral Oral Oral  SpO2:  100% 100% 99%  Weight:      Height:        Intake/Output Summary (Last 24 hours) at 02/10/2021 0730 Last data filed at 02/10/2021 0648 Gross per 24 hour  Intake 960 ml  Output 875 ml  Net 85 ml   Filed Weights   02/03/21 1749  Weight: 63.8 kg    Examination:  General exam: Appears calm and comfortable  HEENT: AC/AT, PERRLA, OP moist and clear Respiratory system: Clear to auscultation. No wheezes,crackle or rhonchi Cardiovascular system: S1 & S2 heard, RRR. No JVD, murmurs, rubs or gallops Gastrointestinal system: Abdomen is nondistended, soft and nontender. No organomegaly or masses felt. Normal bowel sounds heard. Central nervous system: Alert and oriented. No focal neurological deficits. Extremities: No pedal edema. Symmetric, strength 5/5   Skin: No rashes, lesions or ulcers Psychiatry: Judgement and insight appear normal. Mood & affect appropriate.    Data Reviewed: I have personally reviewed following labs and imaging studies  CBC: Recent Labs  Lab 02/03/21 1036 02/04/21 0226 02/05/21 0637 02/06/21 0346 02/10/21 0344  WBC 3.6* 2.9* 4.0 3.4* 3.5*  NEUTROABS 2.4  --   --   --  1.2*  HGB 13.5 11.5* 10.5* 11.6* 12.6*  HCT 40.7 34.8* 32.5* 35.8* 38.9*  MCV 101.0* 102.7* 105.2* 105.3* 105.7*  PLT 179 146* 154 155 255   Basic Metabolic Panel: Recent Labs   Lab 02/04/21 0226 02/05/21 0637 02/06/21 0346 02/07/21 0342 02/08/21 0334 02/09/21 0329 02/10/21 0344  NA 130*   < > 133* 134* 134* 134* 135  K 3.5   < > 3.3* 4.1 4.3 4.5 4.6  CL 94*   < > 99 103 106 102 104  CO2 28   < > 25 25 22 24 23   GLUCOSE 86   < > 97 98 99 84 93  BUN <5*   < > 6 7 10 12 16   CREATININE 0.62   < > 0.64 0.68 0.60* 0.66 0.77  CALCIUM 7.8*   < > 8.0* 8.4* 8.7* 8.9 9.0  MG 1.5*  --   --  1.6* 1.7 1.8 1.8  PHOS 4.0  --   --   --   --   --   --    < > = values in this interval not displayed.   GFR: Estimated Creatinine Clearance: 109.7 mL/min (by C-G formula based on SCr of 0.77 mg/dL). Liver Function Tests: Recent Labs  Lab 02/05/21 0637 02/06/21 0346 02/07/21 0342 02/08/21 0334 02/09/21 0329  AST 133* 73* 61* 51* 51*  ALT 72* 57* 49* 44 42  ALKPHOS 309* 295* 253* 228* 212*  BILITOT 0.7 0.2* 0.7 0.5 0.5  PROT 7.6 7.9 9.0* 9.3* 9.1*  ALBUMIN 2.0* 2.0* 2.2* 2.3* 2.5*   Recent Labs  Lab 02/03/21 1036  LIPASE 21   No results for input(s): AMMONIA in the last 168 hours. Coagulation Profile:  No results for input(s): INR, PROTIME in the last 168 hours. Cardiac Enzymes: No results for input(s): CKTOTAL, CKMB, CKMBINDEX, TROPONINI in the last 168 hours. BNP (last 3 results) No results for input(s): PROBNP in the last 8760 hours. HbA1C: No results for input(s): HGBA1C in the last 72 hours. CBG: Recent Labs  Lab 02/08/21 1224  GLUCAP 129*   Lipid Profile: No results for input(s): CHOL, HDL, LDLCALC, TRIG, CHOLHDL, LDLDIRECT in the last 72 hours. Thyroid Function Tests: Recent Labs    02/08/21 1026  TSH 1.820  FREET4 0.89   Anemia Panel: Recent Labs    02/08/21 1656  FOLATE 15.7   Sepsis Labs: No results for input(s): PROCALCITON, LATICACIDVEN in the last 168 hours.  Recent Results (from the past 240 hour(s))  Resp Panel by RT-PCR (Flu A&B, Covid) Nasopharyngeal Swab     Status: Abnormal   Collection Time: 02/03/21  3:40 PM    Specimen: Nasopharyngeal Swab; Nasopharyngeal(NP) swabs in vial transport medium  Result Value Ref Range Status   SARS Coronavirus 2 by RT PCR POSITIVE (A) NEGATIVE Final    Comment: CRITICAL RESULT CALLED TO, READ BACK BY AND VERIFIED WITH: AMELIA WOART RN 02/03/21 @1924  BY P.HENDERSON    Influenza A by PCR NEGATIVE NEGATIVE Final   Influenza B by PCR NEGATIVE NEGATIVE Final    Comment: (NOTE) The Xpert Xpress SARS-CoV-2/FLU/RSV plus assay is intended as an aid in the diagnosis of influenza from Nasopharyngeal swab specimens and should not be used as a sole basis for treatment. Nasal washings and aspirates are unacceptable for Xpert Xpress SARS-CoV-2/FLU/RSV testing.  Fact Sheet for Patients: BloggerCourse.comhttps://www.fda.gov/media/152166/download  Fact Sheet for Healthcare Providers: SeriousBroker.ithttps://www.fda.gov/media/152162/download  This test is not yet approved or cleared by the [place] FDA and has been authorized for detection and/or diagnosis of SARS-CoV-2 by FDA under an Emergency Use Authorization (EUA). This EUA will remain in effect (meaning this test can be used) for the duration of the COVID-19 declaration under Section 564(b)(1) of the Act, 21 U.S.C. section 360bbb-3(b)(1), unless the authorization is terminated or revoked.  Performed at [facility], [address] [place], [place] [address]   Acid Fast Smear (AFB)     Status: None   Collection Time: 02/04/21  4:11 PM   Specimen: Vein; Blood  Result Value Ref Range Status   AFB Specimen Processing WRORD  Final    Comment: (NOTE) Test not performed. The required specimen for the test ordered was not received.      Unable to perform on whole blood.      Harrold Donathathan T. was notified 02/05/2021.    Acid Fast Smear NOT PERFORMED  Final    Comment: (NOTE) Test not performed Performed At: [facility] [address] [place], [place] [id number] Jolene SchimkeNagendra Sanjai MD VO:[id number]:[phone]    Source (AFB) BLOOD  Final     Comment: Performed at [facility], [address] [place], [place] [address]      Radiology Studies: No results found.    Scheduled Meds: . (feeding supplement) PROSource Plus  30 mL Oral BID BM  . bictegravir-emtricitabine-tenofovir AF  1 tablet Oral Daily  . enoxaparin (LOVENOX) injection  40 mg Subcutaneous Q24H  . feeding supplement  237 mL Oral BID BM  . fluconazole  200 mg Oral Daily  . folic acid  1 mg Oral Daily  . gabapentin  900 mg Oral QID  . levothyroxine  25 mcg Oral Q0600  . multivitamin with minerals  1 tablet Oral Daily  . sulfamethoxazole-trimethoprim  1 tablet Oral Once per day on Mon Wed Fri  . thiamine  100 mg Oral Daily  . traZODone  50 mg Oral QHS   Continuous Infusions:   LOS: 7 days    Time spent: Total of 25 minutes spent with pt, greater than 50% of which was spent in discussion of  treatment, counseling and coordination of care    Azucena Fallen, MD  How to contact the [facility] Attending or Consulting provider 7A - 7P or covering provider during after hours 7P -7A, for this patient?  1. Check the care team in [facility] and look for a) attending/consulting TRH provider listed and b) the [facility] team listed 2. Log into [email or web address] and use [place]'s universal password to access. If you do not have the password, please contact the hospital operator. 3. Locate the [facility] provider you are looking for under [facility] and page to a number that you can be directly reached. 4. If you still have difficulty reaching the provider, please page the [facility] (Director on Call) for the Hospitalists listed on amion for assistance.

## 2021-02-11 ENCOUNTER — Other Ambulatory Visit (HOSPITAL_COMMUNITY): Payer: Self-pay

## 2021-02-11 DIAGNOSIS — F1010 Alcohol abuse, uncomplicated: Secondary | ICD-10-CM | POA: Diagnosis not present

## 2021-02-11 DIAGNOSIS — G610 Guillain-Barre syndrome: Secondary | ICD-10-CM | POA: Diagnosis not present

## 2021-02-11 DIAGNOSIS — R7989 Other specified abnormal findings of blood chemistry: Secondary | ICD-10-CM | POA: Diagnosis not present

## 2021-02-11 DIAGNOSIS — B20 Human immunodeficiency virus [HIV] disease: Secondary | ICD-10-CM | POA: Diagnosis not present

## 2021-02-11 MED ORDER — FOLIC ACID 1 MG PO TABS
1.0000 mg | ORAL_TABLET | Freq: Every day | ORAL | 3 refills | Status: AC
  Filled 2021-02-11: qty 30, 30d supply, fill #0

## 2021-02-11 MED ORDER — THIAMINE HCL 100 MG PO TABS
100.0000 mg | ORAL_TABLET | Freq: Every day | ORAL | 3 refills | Status: AC
  Filled 2021-02-11: qty 30, 30d supply, fill #0

## 2021-02-11 MED ORDER — LEVOTHYROXINE SODIUM 25 MCG PO TABS
25.0000 ug | ORAL_TABLET | Freq: Every day | ORAL | 0 refills | Status: AC
  Filled 2021-02-11: qty 30, 30d supply, fill #0

## 2021-02-11 MED ORDER — GABAPENTIN 300 MG PO CAPS
900.0000 mg | ORAL_CAPSULE | Freq: Four times a day (QID) | ORAL | 2 refills | Status: AC
  Filled 2021-02-11: qty 360, 30d supply, fill #0

## 2021-02-11 MED ORDER — ADULT MULTIVITAMIN W/MINERALS CH
1.0000 | ORAL_TABLET | Freq: Every day | ORAL | 1 refills | Status: AC
  Filled 2021-02-11: qty 90, 90d supply, fill #0

## 2021-02-11 NOTE — Progress Notes (Signed)
Pt alert and aware sitting up in bed. He states he is doing pretty good. He is getting ready to go home. He told me a little about the [employer]. He has been involved with them for over a year.  The chaplain offered caring and supportive presence, and listening ear.

## 2021-02-11 NOTE — Progress Notes (Signed)
Nutrition Follow-up  DOCUMENTATION CODES:   Not applicable  INTERVENTION:  - continue Ensure Enlive and Prosource Plus BID.  - will complete NFPE at follow-up if patient does not d/c today.    NUTRITION DIAGNOSIS:   Increased nutrient needs related to acute illness,catabolic illness as evidenced by estimated needs. -ongoing  GOAL:   Patient will meet greater than or equal to 90% of their needs -minimally met on average  MONITOR:   PO intake,Supplement acceptance,Labs,Weight trends  ASSESSMENT:   40 year old male with a medical history of homelessness, HIV/AIDS, Guillain Barr syndrome, and latent syphilis. He presented to the ED d/t vomiting x2-4 days, BLE numbness x2-3 weeks with associated fall. Patient has not taken his medications x4 weeks. Patient reports drinking two 24 oz beers on a regular basis.  Limited meal completion documentation available: 100% of dinner on 4/11; 100% of breakfast and 50% of lunch on 4/12; 100% of breakfast on 4/16.  He has been accepting Ensure 75% of the time offered and Prosource Plus nearly 100% of the time offered.   If he consumes 75% of ONS/day, he is consuming 675 kcal and 52 grams protein from ONS/day.   He has not been weighed since admission on 4/10.  Notes indicate that although he remains on COVID-19 precautions, he is asymptomatic.   Discharge order entered earlier this AM; discharge summary has not yet been entered.    Labs reviewed. Medications reviewed; 1 mg folvite/day, 25 mcg oral synthroid/day, 1 tablet multivitamin with minerals/day. 100 mg thiamine/day.     NUTRITION - FOCUSED PHYSICAL EXAM:  unable to complete at this time.   Diet Order:   Diet Order            Diet - low sodium heart healthy           Diet regular Room service appropriate? Yes; Fluid consistency: Thin  Diet effective now                 EDUCATION NEEDS:   No education needs have been identified at this time  Skin:  Skin Assessment:  Reviewed RN Assessment  Last BM:  4/17 (type 4 x1)  Height:   Ht Readings from Last 1 Encounters:  02/03/21 5' 11"  (1.803 m)    Weight:   Wt Readings from Last 1 Encounters:  02/03/21 63.8 kg    Ideal Body Weight:  78.18 kg  BMI:  Body mass index is 19.62 kg/m.  Estimated Nutritional Needs:   Kcal:  2100-2300 kcal  Protein:  105-120 grams  Fluid:  >/= 2.5 L/day      Jarome Matin, MS, RD, LDN, CNSC Inpatient Clinical Dietitian RD pager # available in AMION  After hours/weekend pager # available in [facility]

## 2021-02-11 NOTE — TOC Progression Note (Signed)
Transition of Care [facility]) - Progression Note    Patient Details  Name: Laine Giovanetti MRN: [id number] Date of Birth: 1981/01/05  Transition of [facility]) CM/SW Contact  Geni Bers, RN Phone Number: 02/11/2021, 3:51 PM  Clinical Narrative:    Pt asked for ride to take him to pick up his clothes before taking him to [facility]. Explained to pt that this could not be done. Pt asked if he get a ride to get his clothes, if he could he find his way back here to hospital would hospital will give his a ride [place]. Explain that he is discharged, once he leaves the hospital can not give him a Ride to [facility].    Expected Discharge Plan: Homeless Shelter Barriers to Discharge: No Barriers Identified  Expected Discharge Plan and Services Expected Discharge Plan: Homeless Shelter   Discharge Planning Services: CM Consult   Living arrangements for the past 2 months: Homeless Expected Discharge Date: 02/11/21                                     Social Determinants of Health (SDOH) Interventions    Readmission Risk Interventions Readmission Risk Prevention Plan 08/17/2020  PCP or Specialist Appt within 5-7 Days Complete  Home Care Screening Complete  Medication Review (RN CM) Complete  Some recent data might be hidden

## 2021-02-11 NOTE — Discharge Summary (Signed)
Physician Discharge Summary  Travis Mcgee OMV:[id number] DOB: 10/19/1981 DOA: 02/03/2021  PCP: Patient, No Pcp Per (Inactive)  Admit date: 02/03/2021 Discharge date: 02/11/2021  Admitted From: Homeless Disposition: [facility]  Recommendations for Outpatient Follow-up:  1. Follow up with PCP in 1-2 weeks 2. 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 Patient with significant bilateral lower extremity weakness with falls associated with paresthesias and pain up to his abdomen. Previously admitted October 2021 for concern of GBS versus neurosyphilis and treated with IVIG and IV penicillin. In the ED, patient was found to be afebrile with mild tachycardia, no hypoxia. Alkaline phosphatase 46, lipase 21, AST 182, ALP 103, total bilirubin 1.6. Covid-19 PCR incidentally positive. Abdominal ultrasound with probable hepatic steatosis otherwise unremarkable. Patient received IV fentanyl, Zofran and 1 L LR bolus. EDP consulted ID who recommended admission with GI consultation. Hospital service consulted for further evaluation management.  Patient admitted with somewhat insidious onset of bilateral lower extremity nerve pain and weakness.  He has known history of Guillain-Barr but after further discussion and consult with neurology this does not appear to be consistent with Guillain-Barr given its prolonged course and most notable feature of pain rather than weakness or paresthesias.  He does have profoundly uncontrolled HIV, followed with infectious disease which does include possible  opportunistic infections.  Fortunately patient did not appear to be acutely infected, he was placed on fluconazole as well as Bactrim for opportunistic treatment of esophagitis and similar.  Patient's imaging was unremarkable, there is concern that he could have profound vitamin deficiency however he was given dietary and vitamin supplementation prior to vitamin level evaluation so it is unclear whether or not this is his primary culprit.  Regardless patient's symptoms did not improve with IVIG, he continues to be evaluated with PT who recommend no further inpatient treatment.  There is concern for drug-seeking behavior given patient's continued request for narcotics despite our discussion that if this truly was nerve pain he would benefit from gabapentin, or other CNS targeted medication.  Unfortunately on the day of discharge he also reports a fall from bed where he struck his knee on the floor, fortunately his exam was wholly unremarkable and he was able to ambulate without any deficits.  Patient at this time is otherwise stable and agreeable for discharge to [facility] shelter with close follow-up with his previous physicians at [facility] in regards to his HIV treatment and chronic comorbid conditions.  Discharge Diagnoses:  Principal Problem:   HIV (human immunodeficiency virus infection) (HCC) Active Problems:   AIDS (acquired immune deficiency syndrome) (HCC)   History of syphilis   Guillain Barr syndrome (HCC)   Leg weakness, bilateral   Nausea & vomiting   Elevated LFTs   Alcohol abuse    Discharge Instructions  Discharge Instructions    Call MD for:  severe uncontrolled pain   Complete by: As directed    Diet - low sodium heart healthy   Complete by: As directed    Increase activity slowly   Complete by: As directed      Allergies as of 02/11/2021   No Known Allergies     Medication List  STOP taking these medications   amoxicillin-clavulanate 875-125 MG tablet Commonly known  as: AUGMENTIN   DULoxetine 20 MG capsule Commonly known as: CYMBALTA   traMADol 50 MG tablet Commonly known as: ULTRAM     TAKE these medications   bictegravir-emtricitabine-tenofovir AF 50-200-25 MG Tabs tablet Commonly known as: BIKTARVY TAKE 1 TABLET BY MOUTH DAILY.   cyanocobalamin 1000 MCG tablet Take 1 tablet (1,000 mcg total) by mouth daily.   fluconazole 200 MG tablet Commonly known as: DIFLUCAN Take 1 tablet (200 mg total) by mouth daily for 11 days.   folic acid 1 MG tablet Commonly known as: FOLVITE Take 1 tablet (1 mg total) by mouth daily.   gabapentin 300 MG capsule Commonly known as: NEURONTIN Take 3 capsules (900 mg total) by mouth 4 (four) times daily. What changed:   how much to take  how to take this  when to take this   levothyroxine 25 MCG tablet Commonly known as: SYNTHROID Take 1 tablet (25 mcg total) by mouth daily at 6 (six) AM. Start taking on: February 12, 2021 What changed:   how much to take  how to take this  when to take this   multivitamin with minerals Tabs tablet Take 1 tablet by mouth daily.   sulfamethoxazole-trimethoprim 800-160 MG tablet Commonly known as: BACTRIM DS TAKE 1 TABLET BY MOUTH THREE TIMES A **WEEK** What changed:   how much to take  how to take this  when to take this   thiamine 100 MG tablet Take 1 tablet (100 mg total) by mouth daily.            Durable Medical Equipment  (From admission, onward)         Start     Ordered   02/07/21 1225  For home use only DME Walker rolling  Once       Question Answer Comment  Walker: With 5 Inch Wheels   Patient needs a walker to treat with the following condition Gait abnormality      02/07/21 1224          No Known Allergies  Consultations: Infectious disease, GI, neurology  Procedures/Studies: MR CERVICAL SPINE W WO CONTRAST  Result Date: 02/06/2021 CLINICAL DATA:  Initial evaluation for cervical radiculopathy. History of HIV,  syphilis, Guillain-Barre syndrome, leg weakness. EXAM: MRI CERVICAL AND THORACIC SPINE WITHOUT CONTRAST TECHNIQUE: Multiplanar and multiecho pulse sequences of the cervical spine, to include the craniocervical junction and cervicothoracic junction, and the thoracic spine, were obtained without intravenous contrast. COMPARISON:  None. Previous MRI from 07/29/2020 FINDINGS: MRI CERVICAL SPINE FINDINGS Alignment: Mild straightening of the normal cervical lordosis. Trace physiologic anterolisthesis of C7 on T1. Vertebrae: Vertebral body height maintained without acute or chronic fracture. Bone marrow signal intensity diffusely decreased on T1 weighted imaging, nonspecific, but most commonly related to anemia, smoking, or obesity. No discrete or worrisome osseous lesions. No abnormal marrow edema or enhancement. Cord: Signal intensity within the cervical spinal cord is normal. A degree of underlying diffuse cord atrophy noted, similar to previous. No abnormal enhancement. No epidural collections. Posterior Fossa, vertebral arteries, paraspinal tissues: Visualized brain and posterior fossa within normal limits. Craniocervical junction normal. Paraspinous and prevertebral soft tissues within normal limits. Normal intravascular flow voids seen within the vertebral arteries bilaterally. Disc levels: C2-C3: Unremarkable. C3-C4:  Unremarkable. C4-C5: Mild bilateral uncovertebral hypertrophy without significant disc bulge. No spinal stenosis. Foramina remain patent. C5-C6: Mild right-sided uncovertebral hypertrophy without significant disc bulge. No spinal stenosis. Mild  right C6 foraminal narrowing. Left neural foramina remains patent. C6-C7: Mild right-sided uncovertebral hypertrophy without significant disc bulge. No spinal stenosis. Mild right C7 foraminal stenosis. Left neural foramina remains patent. C7-T1:  Unremarkable. MRI THORACIC SPINE FINDINGS Alignment: Trace scoliosis. Alignment otherwise normal with preservation  of the normal thoracic kyphosis. No listhesis. Vertebrae: Vertebral body height maintained without acute or chronic fracture. Bone marrow signal intensity diffusely decreased on T1 weighted imaging, nonspecific, but most commonly related to anemia, smoking, or obesity. No discrete or worrisome osseous lesions. No abnormal marrow edema or enhancement. Cord: Signal intensity within the thoracic spinal cord is within normal limits. Degree of underlying diffuse cord atrophy noted. No abnormal enhancement. No epidural collections or other abnormality. Paraspinal and other soft tissues: Paraspinous soft tissues within normal limits. Trace layering bilateral pleural effusions noted. Visualized visceral structures otherwise grossly unremarkable. Disc levels: T11-12: Disc desiccation with mild discogenic reactive endplate change. No significant disc bulge or focal disc herniation. No stenosis. Otherwise, no other significant disc pathology seen within the thoracic spine. No other disc bulge or focal disc herniation. No other stenosis or neural impingement. IMPRESSION: MRI CERVICAL SPINE IMPRESSION: 1. 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 2. Right-sided uncovertebral hypertrophy at C5-6 and C6-7 with resultant mild right C6 and C7 foraminal stenosis. 3. Otherwise unremarkable and normal MRI of the cervical spine. MRI THORACIC SPINE IMPRESSION: 1. 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 2. Mild degenerative disc disease at T11-12 without stenosis. Otherwise no significant disc pathology, stenosis, or evidence for neural impingement. 3. Trace layering bilateral pleural effusions. Electronically Signed   By: Rise MuBenjamin  McClintock M.D.   On: 02/06/2021 02:13   MR THORACIC SPINE W WO CONTRAST  Result Date: 02/06/2021 CLINICAL DATA:  Initial evaluation for cervical radiculopathy. History of HIV, syphilis, Guillain-Barre syndrome, leg weakness. EXAM: MRI CERVICAL AND THORACIC SPINE WITHOUT CONTRAST TECHNIQUE:  Multiplanar and multiecho pulse sequences of the cervical spine, to include the craniocervical junction and cervicothoracic junction, and the thoracic spine, were obtained without intravenous contrast. COMPARISON:  None. Previous MRI from 07/29/2020 FINDINGS: MRI CERVICAL SPINE FINDINGS Alignment: Mild straightening of the normal cervical lordosis. Trace physiologic anterolisthesis of C7 on T1. Vertebrae: Vertebral body height maintained without acute or chronic fracture. Bone marrow signal intensity diffusely decreased on T1 weighted imaging, nonspecific, but most commonly related to anemia, smoking, or obesity. No discrete or worrisome osseous lesions. No abnormal marrow edema or enhancement. Cord: Signal intensity within the cervical spinal cord is normal. A degree of underlying diffuse cord atrophy noted, similar to previous. No abnormal enhancement. No epidural collections. Posterior Fossa, vertebral arteries, paraspinal tissues: Visualized brain and posterior fossa within normal limits. Craniocervical junction normal. Paraspinous and prevertebral soft tissues within normal limits. Normal intravascular flow voids seen within the vertebral arteries bilaterally. Disc levels: C2-C3: Unremarkable. C3-C4:  Unremarkable. C4-C5: Mild bilateral uncovertebral hypertrophy without significant disc bulge. No spinal stenosis. Foramina remain patent. C5-C6: Mild right-sided uncovertebral hypertrophy without significant disc bulge. No spinal stenosis. Mild right C6 foraminal narrowing. Left neural foramina remains patent. C6-C7: Mild right-sided uncovertebral hypertrophy without significant disc bulge. No spinal stenosis. Mild right C7 foraminal stenosis. Left neural foramina remains patent. C7-T1:  Unremarkable. MRI THORACIC SPINE FINDINGS Alignment: Trace scoliosis. Alignment otherwise normal with preservation of the normal thoracic kyphosis. No listhesis. Vertebrae: Vertebral body height maintained without acute or chronic  fracture. Bone marrow signal intensity diffusely decreased on T1 weighted imaging, nonspecific, but most commonly related to anemia, smoking, or obesity. No  discrete or worrisome osseous lesions. No abnormal marrow edema or enhancement. Cord: Signal intensity within the thoracic spinal cord is within normal limits. Degree of underlying diffuse cord atrophy noted. No abnormal enhancement. No epidural collections or other abnormality. Paraspinal and other soft tissues: Paraspinous soft tissues within normal limits. Trace layering bilateral pleural effusions noted. Visualized visceral structures otherwise grossly unremarkable. Disc levels: T11-12: Disc desiccation with mild discogenic reactive endplate change. No significant disc bulge or focal disc herniation. No stenosis. Otherwise, no other significant disc pathology seen within the thoracic spine. No other disc bulge or focal disc herniation. No other stenosis or neural impingement. IMPRESSION: MRI CERVICAL SPINE IMPRESSION: 1. 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 2. Right-sided uncovertebral hypertrophy at C5-6 and C6-7 with resultant mild right C6 and C7 foraminal stenosis. 3. Otherwise unremarkable and normal MRI of the cervical spine. MRI THORACIC SPINE IMPRESSION: 1. 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 2. Mild degenerative disc disease at T11-12 without stenosis. Otherwise no significant disc pathology, stenosis, or evidence for neural impingement. 3. Trace layering bilateral pleural effusions. Electronically Signed   By: Rise Mu M.D.   On: 02/06/2021 02:13   US Abdomen Limited  Result Date: 02/03/2021 CLINICAL DATA:  Elevated LFTs. EXAM: ULTRASOUND ABDOMEN LIMITED RIGHT UPPER QUADRANT COMPARISON:  No comparison studies available. FINDINGS: Gallbladder: No gallstones or wall thickening visualized. No sonographic Murphy sign noted by sonographer. Common bile duct: Diameter: 5 mm Liver: Mild coarsening of the hepatic  echotexture suggests fatty deposition. Portal vein is patent on color Doppler imaging with normal direction of blood flow towards the liver. Other: None. IMPRESSION: Probable hepatic steatosis.  Otherwise unremarkable. Electronically Signed   By: Kennith Center M.D.   On: 02/03/2021 14:53      Subjective: No acute issues or events overnight, this morning patient indicates a mechanical fall in the room with fall to his right knee without further ambulatory dysfunction or gait abnormality.   Discharge Exam: Vitals:   02/10/21 1938 02/11/21 0652  BP: 111/80 94/65  Pulse: 84 89  Resp: 18 18  Temp: 99.3 F (37.4 C) 98.4 F (36.9 C)  SpO2: 99% 100%   Vitals:   02/10/21 0645 02/10/21 1151 02/10/21 1938 02/11/21 0652  BP: 92/62 108/74 111/80 94/65  Pulse: 90 98 84 89  Resp: 16 18 18 18   Temp: 99.1 F (37.3 C) 98.4 F (36.9 C) 99.3 F (37.4 C) 98.4 F (36.9 C)  TempSrc: Oral Oral Oral Oral  SpO2: 99% 99% 99% 100%  Weight:      Height:        General: Pt is alert, awake, not in acute distress Cardiovascular: RRR, S1/S2 +, no rubs, no gallops Respiratory: CTA bilaterally, no wheezing, no rhonchi Abdominal: Soft, NT, ND, bowel sounds + Extremities: no edema, no cyanosis    The results of significant diagnostics from this hospitalization (including imaging, microbiology, ancillary and laboratory) are listed below for reference.     Microbiology: Recent Results (from the past 240 hour(s))  Resp Panel by RT-PCR (Flu A&B, Covid) Nasopharyngeal Swab     Status: Abnormal   Collection Time: 02/03/21  3:40 PM   Specimen: Nasopharyngeal Swab; Nasopharyngeal(NP) swabs in vial transport medium  Result Value Ref Range Status   SARS Coronavirus 2 by RT PCR POSITIVE (A) NEGATIVE Final    Comment: CRITICAL RESULT CALLED TO, READ BACK BY AND VERIFIED WITH: AMELIA WOART RN 02/03/21 @1924  BY P.HENDERSON    Influenza A by PCR NEGATIVE NEGATIVE  Final   Influenza B by PCR NEGATIVE NEGATIVE  Final    Comment: (NOTE) The Xpert Xpress SARS-CoV-2/FLU/RSV plus assay is intended as an aid in the diagnosis of influenza from Nasopharyngeal swab specimens and should not be used as a sole basis for treatment. Nasal washings and aspirates are unacceptable for Xpert Xpress SARS-CoV-2/FLU/RSV testing.  Fact Sheet for Patients: BloggerCourse.com  Fact Sheet for Healthcare Providers: SeriousBroker.it  This test is not yet approved or cleared by the [place] FDA and has been authorized for detection and/or diagnosis of SARS-CoV-2 by FDA under an Emergency Use Authorization (EUA). This EUA will remain in effect (meaning this test can be used) for the duration of the COVID-19 declaration under Section 564(b)(1) of the Act, 21 U.S.C. section 360bbb-3(b)(1), unless the authorization is terminated or revoked.  Performed at [facility], [address] [place], [place] [address]   Acid Fast Smear (AFB)     Status: None   Collection Time: 02/04/21  4:11 PM   Specimen: Vein; Blood  Result Value Ref Range Status   AFB Specimen Processing WRORD  Final    Comment: (NOTE) Test not performed. The required specimen for the test ordered was not received.      Unable to perform on whole blood.      Harrold Donath T. was notified 02/05/2021.    Acid Fast Smear NOT PERFORMED  Final    Comment: (NOTE) Test not performed Performed At: [facility] [address] [place], [place] [id number] Jolene Schimke MD EX:[id number]    Source (AFB) BLOOD  Final    Comment: Performed at [facility], [address] [place], [place] [address]     Labs: BNP (last 3 results) No results for input(s): BNP in the last 8760 hours. Basic Metabolic Panel: Recent Labs  Lab 02/06/21 0346 02/07/21 0342 02/08/21 0334 02/09/21 0329 02/10/21 0344  NA 133* 134* 134* 134* 135  K 3.3* 4.1 4.3 4.5 4.6  CL 99 103 106  102 104  CO2 GLUCOSE 97 98 99 84 93  BUN CREATININE 0.64 0.68 0.60* 0.66 0.77  CALCIUM 8.0* 8.4* 8.7* 8.9 9.0  MG  --  1.6* 1.7 1.8 1.8   Liver Function Tests: Recent Labs  Lab 02/05/21 0637 02/06/21 0346 02/07/21 0342 02/08/21 0334 02/09/21 0329  AST 133* 73* 61* 51* 51*  ALT 72* 57* 49* 44 42  ALKPHOS 309* 295* 253* 228* 212*  BILITOT 0.7 0.2* 0.7 0.5 0.5  PROT 7.6 7.9 9.0* 9.3* 9.1*  ALBUMIN 2.0* 2.0* 2.2* 2.3* 2.5*   No results for input(s): LIPASE, AMYLASE in the last 168 hours. No results for input(s): AMMONIA in the last 168 hours. CBC: Recent Labs  Lab 02/05/21 0637 02/06/21 0346 02/10/21 0344  WBC 4.0 3.4* 3.5*  NEUTROABS  --   --  1.2*  HGB 10.5* 11.6* 12.6*  HCT 32.5* 35.8* 38.9*  MCV 105.2* 105.3* 105.7*  PLT 154 155 255   Cardiac Enzymes: No results for input(s): CKTOTAL, CKMB, CKMBINDEX, TROPONINI in the last 168 hours. BNP: Invalid input(s): POCBNP CBG: Recent Labs  Lab 02/08/21 1224  GLUCAP 129*   D-Dimer No results for input(s): DDIMER in the last 72 hours. Hgb A1c No results for input(s): HGBA1C in the last 72 hours. Lipid Profile No results for input(s): CHOL, HDL, LDLCALC, TRIG, CHOLHDL, LDLDIRECT in the last 72 hours. Thyroid function studies Recent Labs  02/08/21 1026  TSH 1.820   Anemia work up Recent Labs    02/08/21 1656  FOLATE 15.7   Urinalysis    Component Value Date/Time   COLORURINE AMBER (A) 02/03/2021 1036   APPEARANCEUR HAZY (A) 02/03/2021 1036   LABSPEC 1.026 02/03/2021 1036   PHURINE 5.0 02/03/2021 1036   GLUCOSEU NEGATIVE 02/03/2021 1036   HGBUR NEGATIVE 02/03/2021 1036   BILIRUBINUR MODERATE (A) 02/03/2021 1036   KETONESUR 5 (A) 02/03/2021 1036   PROTEINUR 100 (A) 02/03/2021 1036   NITRITE NEGATIVE 02/03/2021 1036   LEUKOCYTESUR TRACE (A) 02/03/2021 1036   Sepsis Labs Invalid input(s): PROCALCITONIN,  WBC,  LACTICIDVEN Microbiology Recent Results (from the past 240  hour(s))  Resp Panel by RT-PCR (Flu A&B, Covid) Nasopharyngeal Swab     Status: Abnormal   Collection Time: 02/03/21  3:40 PM   Specimen: Nasopharyngeal Swab; Nasopharyngeal(NP) swabs in vial transport medium  Result Value Ref Range Status   SARS Coronavirus 2 by RT PCR POSITIVE (A) NEGATIVE Final    Comment: CRITICAL RESULT CALLED TO, READ BACK BY AND VERIFIED WITH: AMELIA WOART RN 02/03/21  BY P.HENDERSON    Influenza A by PCR NEGATIVE NEGATIVE Final   Influenza B by PCR NEGATIVE NEGATIVE Final    Comment: (NOTE) The Xpert Xpress SARS-CoV-2/FLU/RSV plus assay is intended as an aid in the diagnosis of influenza from Nasopharyngeal swab specimens and should not be used as a sole basis for treatment. Nasal washings and aspirates are unacceptable for Xpert Xpress SARS-CoV-2/FLU/RSV testing.  Fact Sheet for Patients: BloggerCourse.com  Fact Sheet for Healthcare Providers: SeriousBroker.it  This test is not yet approved or cleared by the [place] FDA and has been authorized for detection and/or diagnosis of SARS-CoV-2 by FDA under an Emergency Use Authorization (EUA). This EUA will remain in effect (meaning this test can be used) for the duration of the COVID-19 declaration under Section 564(b)(1) of the Act, 21 U.S.C. section 360bbb-3(b)(1), unless the authorization is terminated or revoked.  Performed at [facility], [address] [place], [place] [address]   Acid Fast Smear (AFB)     Status: None   Collection Time: 02/04/21  4:11 PM   Specimen: Vein; Blood  Result Value Ref Range Status   AFB Specimen Processing WRORD  Final    Comment: (NOTE) Test not performed. The required specimen for the test ordered was not received.      Unable to perform on whole blood.      Harrold Donath T. was notified 02/05/2021.    Acid Fast Smear NOT PERFORMED  Final    Comment: (NOTE) Test not performed Performed  At: [facility] [address] [place], [place] [id number] Jolene Schimke MD XB:[id number]    Source (AFB) BLOOD  Final    Comment: Performed at [facility], [address] [place], [place] [address]     Time coordinating discharge: Over 30 minutes  SIGNED:   Azucena Fallen, DO [facility] 02/11/2021, 7:47 AM Pager   If 7PM-7AM, please contact night-coverage [email or web address]

## 2021-02-12 ENCOUNTER — Telehealth: Payer: Self-pay | Admitting: *Deleted

## 2021-02-12 ENCOUNTER — Emergency Department (HOSPITAL_COMMUNITY)
Admission: EM | Admit: 2021-02-12 | Discharge: 2021-02-15 | Disposition: A | Discharge status: Home / Self Care | Payer: No Typology Code available for payment source | Attending: Emergency Medicine | Admitting: Emergency Medicine

## 2021-02-12 ENCOUNTER — Other Ambulatory Visit: Payer: Self-pay

## 2021-02-12 DIAGNOSIS — Z20822 Contact with and (suspected) exposure to covid-19: Secondary | ICD-10-CM | POA: Diagnosis not present

## 2021-02-12 DIAGNOSIS — M79605 Pain in left leg: Secondary | ICD-10-CM | POA: Diagnosis not present

## 2021-02-12 DIAGNOSIS — R531 Weakness: Secondary | ICD-10-CM | POA: Insufficient documentation

## 2021-02-12 DIAGNOSIS — Z79899 Other long term (current) drug therapy: Secondary | ICD-10-CM | POA: Insufficient documentation

## 2021-02-12 DIAGNOSIS — Y92511 Restaurant or cafe as the place of occurrence of the external cause: Secondary | ICD-10-CM | POA: Diagnosis not present

## 2021-02-12 DIAGNOSIS — W19XXXA Unspecified fall, initial encounter: Secondary | ICD-10-CM | POA: Diagnosis not present

## 2021-02-12 DIAGNOSIS — M79604 Pain in right leg: Secondary | ICD-10-CM | POA: Diagnosis present

## 2021-02-12 DIAGNOSIS — F17210 Nicotine dependence, cigarettes, uncomplicated: Secondary | ICD-10-CM | POA: Insufficient documentation

## 2021-02-12 DIAGNOSIS — R202 Paresthesia of skin: Secondary | ICD-10-CM | POA: Insufficient documentation

## 2021-02-12 DIAGNOSIS — Z21 Asymptomatic human immunodeficiency virus [HIV] infection status: Secondary | ICD-10-CM | POA: Diagnosis not present

## 2021-02-12 LAB — CBC WITH DIFFERENTIAL/PLATELET
Abs Immature Granulocytes: 0.02 10*3/uL (ref 0.00–0.07)
Basophils Absolute: 0.1 10*3/uL (ref 0.0–0.1)
Basophils Relative: 1 %
Eosinophils Absolute: 0.2 10*3/uL (ref 0.0–0.5)
Eosinophils Relative: 3 %
HCT: 36.4 % — ABNORMAL LOW (ref 39.0–52.0)
Hemoglobin: 11.8 g/dL — ABNORMAL LOW (ref 13.0–17.0)
Immature Granulocytes: 0 %
Lymphocytes Relative: 29 %
Lymphs Abs: 1.8 10*3/uL (ref 0.7–4.0)
MCH: 34.6 pg — ABNORMAL HIGH (ref 26.0–34.0)
MCHC: 32.4 g/dL (ref 30.0–36.0)
MCV: 106.7 fL — ABNORMAL HIGH (ref 80.0–100.0)
Monocytes Absolute: 0.7 10*3/uL (ref 0.1–1.0)
Monocytes Relative: 11 %
Neutro Abs: 3.6 10*3/uL (ref 1.7–7.7)
Neutrophils Relative %: 56 %
Platelets: 311 10*3/uL (ref 150–400)
RBC: 3.41 MIL/uL — ABNORMAL LOW (ref 4.22–5.81)
RDW: 17.4 % — ABNORMAL HIGH (ref 11.5–15.5)
WBC: 6.4 10*3/uL (ref 4.0–10.5)
nRBC: 0 % (ref 0.0–0.2)

## 2021-02-12 LAB — COPPER, SERUM: Copper: 130 ug/dL (ref 69–132)

## 2021-02-12 NOTE — ED Triage Notes (Signed)
Patient BIB EMS from motel. EMS stated pt c/o fell x4 today. Pt c/o increase BLE weakness and numbness. Denies N/V and dizziness.  Pt hx of guillian Barre Syndrome.

## 2021-02-12 NOTE — Telephone Encounter (Signed)
-----   Message from Kathlynn Grate, DO sent at 02/07/2021 10:20 AM EDT ----- Regarding: HIV continuity of care Good morning!  This pt is currently admitted at [facility] and will be discharging to a shelter in [place] so he can establish care at the [facility].  We will send out with supply of Biktarvy and TMP-SMX and Fluconazole but is it possible to reach out to [facility] to assist with transition of care?  Thanks, Greig Castilla

## 2021-02-12 NOTE — Telephone Encounter (Signed)
Reached out to [place], Office manager at [employer], to let her know about Travis Mcgee's hospitalization and discharge to [facility]. Lear Ng is familiar with the patient and will relay update to Zella Ball, DIS bridge counselor in the [place] region, for connection to care. Andree Coss, RN
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
## 2021-02-13 LAB — COMPREHENSIVE METABOLIC PANEL
ALT: 33 U/L (ref 0–44)
AST: 31 U/L (ref 15–41)
Albumin: 3.1 g/dL — ABNORMAL LOW (ref 3.5–5.0)
Alkaline Phosphatase: 191 U/L — ABNORMAL HIGH (ref 38–126)
Anion gap: 12 (ref 5–15)
BUN: 6 mg/dL (ref 6–20)
CO2: 17 mmol/L — ABNORMAL LOW (ref 22–32)
Calcium: 8.9 mg/dL (ref 8.9–10.3)
Chloride: 109 mmol/L (ref 98–111)
Creatinine, Ser: 0.47 mg/dL — ABNORMAL LOW (ref 0.61–1.24)
GFR, Estimated: >60 mL/min (ref >60)
Glucose, Bld: 89 mg/dL (ref 70–99)
Potassium: 3.9 mmol/L (ref 3.5–5.1)
Sodium: 138 mmol/L (ref 135–145)
Total Bilirubin: 0.8 mg/dL (ref 0.3–1.2)
Total Protein: 10.1 g/dL — ABNORMAL HIGH (ref 6.5–8.1)

## 2021-02-13 LAB — RESP PANEL BY RT-PCR (FLU A&B, COVID) ARPGX2
Influenza A by PCR: NEGATIVE
Influenza B by PCR: NEGATIVE
SARS Coronavirus 2 by RT PCR: NEGATIVE

## 2021-02-13 LAB — VITAMIN B1: Vitamin B1 (Thiamine): 174.9 nmol/L (ref 66.5–200.0)

## 2021-02-13 NOTE — Progress Notes (Signed)
TOC CSW spoke with pt in regards to admission to SNF.  CSW explained the process to pt, pt agreed to having his information faxed out for bed offers, and pt agreed to stay at facility for 30 days or until he is dc'd.  CSW will continue to follow for dc needs.  Clarice Zulauf Tarpley-Carter, MSW, LCSW-A Pronouns:  She, Her, Hers                  [facility] ED Transitions of CareClinical Social Worker [email or web address] [phone]

## 2021-02-13 NOTE — ED Notes (Signed)
PA to bedside to discuss disposition with patient, social work looking for rehab facility placement for patient

## 2021-02-13 NOTE — ED Notes (Signed)
Patient is resting comfortably. Call bell and walker within reach

## 2021-02-13 NOTE — ED Notes (Signed)
PT to bedside at this time 

## 2021-02-13 NOTE — Progress Notes (Signed)
Appropriate Use Committee Chart Review  Chart reviewed by the physician advisor with input from [facility] and the attending MD as needed for review of the appropriateness for SNF referral.  TOC notes, PT notes, and ED PA-C notes reviewed for medical necessity to determine if the patient's needs are appropriate for short-term rehab to return to a prior level of function versus the likely need for custodial care.  At this time, the patient meets Medicare criteria for SNF placement.  40 year old male with medical history significant for HIV/AIDS, [name] syndrome, latent syphilis, homelessness, recently hospitalized 02/03/2021 - 02/11/2021 with complaints of nausea, vomiting and ascending bilateral lower extremity weakness/numbness for 2 to 3 weeks.  Neurology consulted but did not feel that his presentation was consistent with [name] syndrome.  Symptoms did not improve with IVIG.  He was seen by PT that admission and recommended use of a cane, patient seen ambulating in the room, PT signed off.  He was discharged to a homeless shelter.  He however returned to ED on 02/12/2021 with complaints of persistent fall secondary to lower extremity weakness and paresthesias.  Patient reported that he did not feel that he can ambulate safely.  He had actually even fallen in the hospital on day of discharge 02/11/2021 but did not sustain any injuries.  As per PT evaluation 02/13/21, only able to ambulate 25 feet with minimum guard and moderate assistance with a rolling walker and they recommend SNF.  Appropriate for SNF admission for short-term rehab.   Recommendations: The patient is SNF appropriate for short-term rehab/skilled nursing interventions.  A consult to the Transitions of Care Team has been made to facilitate placement.    Marcellus Scott, MD [facility]ist/Physician Advisor  02/13/2021 12:24 PM

## 2021-02-13 NOTE — ED Notes (Signed)
Ricquita from Social work on phone to talk to patient at this time

## 2021-02-13 NOTE — ED Provider Notes (Signed)
  Physical Exam  BP (!) 126/93   Pulse 97   Temp 98 F (36.7 C) (Oral)   Resp 18   Ht 5\' 11"  (1.803 m)   Wt 65 kg   SpO2 100%   BMI 19.99 kg/m   Physical Exam  ED Course/Procedures     Procedures  MDM   Care of this patient assumed from preceding ED provider, , PA-C, at time of shift change.  Please see her associated note for further insight this patient's ED course.  In brief patient with history of HIV and Guillain-Barr with persistent extremity weakness and neuropathy recently discharged from the hospital on 4/18 at which time he was cleared with neurology and PT note.  Advised to walk with a cane, however he has had recurrent falls since he was discharged hospital return the emergency department for evaluation.  At time of shift change, patient is awaiting PT eval and social work consult; will likely require SNF placement for his own safety as he has homeless residing at shelter.  PT eval complete, recommending SNF placement. Internal medicine TOC physician input who agree with plan for SNF placement. Awaiting TOC SW consult.   Did discuss patient's case with ED [facility] provider [facility] who states SNF has been secured; awaiting completion of paperwork.   Isidoro Donning voiced understanding of his medical evaluation and watreatment plan.  Each of his questions was answered to his expressed satisfaction.  He is amenable to plan for admission to SNF at this time.  This chart was dictated using voice recognition software, Dragon. Despite the best efforts of this provider to proofread and correct errors, errors may still occur which can change documentation meaning.      [name] 02/13/21 1524    02/15/21, MD 02/15/21 [phone]

## 2021-02-13 NOTE — ED Notes (Signed)
Patient ambulated down hallway to bathroom with walker

## 2021-02-13 NOTE — ED Notes (Signed)
Pt sitting on the edge of bed on cell phone.

## 2021-02-13 NOTE — ED Notes (Signed)
Patient received second dinner tray and coffee with creamer and sugar per request
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
## 2021-02-13 NOTE — Evaluation (Signed)
Physical Therapy Evaluation Patient Details Name: Travis Mcgee MRN: [id number] DOB: 1981-07-09 Today's Date: 02/13/2021   History of Present Illness  Patient with history of HIV, Guillain Barrre, latent syphilis, recent hospitalization for evaluation of LE weakness/paresthesias, discharged 02/11/21. Returns 4/19 with recent multiple fallls since DC due to symptoms of weakness and pain in bil legs.[place]  Clinical Impression  [facility] from [facility] on [facility]11/[facility]. Per last PT note 4/17(prior to DC)recommended use of Cane, documented  That patient was ambulating in room. PT actually signed off. Patient reports several falls since DC due to LE weakness and impaired sensation since DC'd x 1 day.  Patient with noted impaired  Tone/Active movement both legs, very slow and "stiff", patient reports  Very painful/hypersensitive/tingling sensation of both  Feet. Patient indicates that the  Abnormal sensation is not much changed from recent past.  Pt admitted with above diagnosis.  Pt currently with functional limitations due to the deficits listed below (see PT Problem List). Pt will benefit from skilled PT to increase their independence and safety with mobility to allow discharge to the venue listed below.       Follow Up Recommendations SNF (some facility that can provide assistance for safe  ADL's)    Equipment Recommendations  Rolling walker with 5" wheels    Recommendations for Other Services       Precautions / Restrictions Precautions Precautions: Fall Precaution Comments: hypersensitivity both  feet>legs      Mobility  Bed Mobility Overal bed mobility: Independent Bed Mobility: Supine to Sit;Sit to Supine                Transfers   Equipment used: Rolling walker (2 wheeled);None Transfers: Sit to/from Stand Sit to Stand: Min guard         General transfer comment: wide base and guarded stance when rising, with  and without RW  Ambulation/Gait Ambulation/Gait assistance: Min guard;Mod assist Gait Distance (Feet): 25 Feet Assistive device: Rolling walker (2 wheeled) Gait Pattern/deviations: Step-to pattern Gait velocity: decr. guarded   General Gait Details: with Rw able to take slow step to steps, trunk slightly flexed. With attempts to take steps without RW, very guarded and unable to safely step, reliant on support  Stairs            Wheelchair Mobility    Modified Rankin (Stroke Patients Only)       Balance Overall balance assessment: History of Falls;Needs assistance Sitting-balance support: Feet supported;No upper extremity supported Sitting balance-Leahy Scale: Good     Standing balance support: During functional activity;No upper extremity supported Standing balance-Leahy Scale: Poor                               Pertinent Vitals/Pain Faces Pain Scale: Hurts whole lot Pain Location: both feet and legs Pain Descriptors / Indicators: Discomfort;Numbness;Tingling Pain Intervention(s): Monitored during session    Home Living Family/patient expects to be discharged to:: Unsure (was to go to homeless shelter after Dc 4/18.) Living Arrangements: Alone   Type of Home:  (recently at motel?) Home Access: Stairs to enter Entrance Stairs-Rails: Right;Left Entrance Stairs-Number of Steps: 3 Home Layout: One level Home Equipment: None      Prior Function Level of Independence: Needs assistance   Gait / Transfers Assistance Needed: was mod I in room with 1 UE support prior to Dc from [employer]  4/18.           Hand Dominance        Extremity/Trunk Assessment   Upper Extremity Assessment Upper Extremity Assessment: Overall WFL for tasks assessed    Lower Extremity Assessment RLE Deficits / Details: grossly 4/5, able to bear weight, movements are very slow/tardive/stiff. dorsiflexion  limited  in partial AROM, reports groin discomfort with SLR and hip  flexion. endorses tingling and pain feet and lower legs."It's about the same" LLE Deficits / Details: similar to right leg throughout    Cervical / Trunk Assessment Cervical / Trunk Assessment: Normal  Communication      Cognition Arousal/Alertness: Awake/alert Behavior During Therapy: WFL for tasks assessed/performed Overall Cognitive Status: Within Functional Limits for tasks assessed                                        General Comments      Exercises     Assessment/Plan    PT Assessment Patient needs continued PT services  PT Problem List Decreased strength;Decreased mobility;Decreased activity tolerance;Decreased balance;Decreased knowledge of use of DME;Impaired tone;Impaired sensation;Pain       PT Treatment Interventions DME instruction;Gait training;Therapeutic exercise;Functional mobility training;Therapeutic activities;Patient/family education;Balance training    PT Goals (Current goals can be found in the Care Plan section)  Acute Rehab PT Goals Patient Stated Goal: I need saomewhere to stay to get help and and not fall PT Goal Formulation: With patient Time For Goal Achievement: 02/27/21 Potential to Achieve Goals: Good    Frequency Min 3X/week   Barriers to discharge        Co-evaluation               AM-PAC PT "6 Clicks" Mobility  Outcome Measure Help needed turning from your back to your side while in a flat bed without using bedrails?: None Help needed moving from lying on your back to sitting on the side of a flat bed without using bedrails?: None Help needed moving to and from a bed to a chair (including a wheelchair)?: A Little Help needed standing up from a chair using your arms (e.g., wheelchair or bedside chair)?: A Little Help needed to walk in hospital room?: A Little Help needed climbing 3-5 steps with a railing? : A Lot 6 Click Score: 19    End of Session Equipment Utilized During Treatment: Gait belt Activity  Tolerance: Patient limited by pain Patient left: with call bell/phone within reach;in bed;with nursing/sitter in room Nurse Communication: Mobility status PT Visit Diagnosis: History of falling (Z91.81);Other symptoms and signs involving the nervous system (R29.898)    Time: 6553-7482 PT Time Calculation (min) (ACUTE ONLY): 30 min   Charges:   PT Evaluation $PT Eval Low Complexity: 1 Low PT Treatments $Gait Training: 8-22 mins       Travis Mcgee PT Acute Rehabilitation Services Pager [phone] Office [phone]  Travis Mcgee 02/13/2021, 9:08 AM

## 2021-02-13 NOTE — ED Notes (Signed)
Pt ambulating to bathroom with assistance from walker.

## 2021-02-13 NOTE — Progress Notes (Signed)
TOC CSW spoke with Travis Mcgee/[place].  Pt is being reviewed.  CSW will continue to follow for dc needs.  Travis Mcgee, MSW, LCSW-A Pronouns:  She, Her, Hers                  [facility] ED Transitions of CareClinical Social Worker [email or web address] [phone]

## 2021-02-13 NOTE — ED Notes (Signed)
Patient ambulated down hallway to bathroom, stable gait

## 2021-02-13 NOTE — ED Notes (Signed)
Patient provided with breakfast tray, extra coffee, creamer and sugar at this time

## 2021-02-13 NOTE — ED Notes (Signed)
Patient provided with more hand urinals

## 2021-02-13 NOTE — ED Notes (Signed)
Patient provided with lunch tray, extra coffee, creamer and sugar at this time

## 2021-02-13 NOTE — ED Notes (Signed)
Patient ambulating in hallway with walker at this time

## 2021-02-13 NOTE — ED Notes (Signed)
Patient provided with dinner tray at this time. 

## 2021-02-13 NOTE — ED Notes (Signed)
Pt moved to room 25 to sleep. Pt is aware he may be moved back out into hallway bed during the day. Pt is ambulatory with walker, provided with new blankets and sheets. Pt is very appreciative.

## 2021-02-14 ENCOUNTER — Other Ambulatory Visit (HOSPITAL_COMMUNITY): Payer: Self-pay

## 2021-02-14 MED ORDER — BICTEGRAVIR-EMTRICITAB-TENOFOV 50-200-25 MG PO TABS
1.0000 | ORAL_TABLET | Freq: Every day | ORAL | Status: DC
  Administered 2021-02-15: 1 via ORAL
  Filled 2021-02-14 (×2): qty 1

## 2021-02-14 MED ORDER — LEVOTHYROXINE SODIUM 25 MCG PO TABS
25.0000 ug | ORAL_TABLET | Freq: Every day | ORAL | Status: DC
  Administered 2021-02-15: 25 ug via ORAL
  Filled 2021-02-14: qty 1

## 2021-02-14 MED ORDER — GABAPENTIN 300 MG PO CAPS
900.0000 mg | ORAL_CAPSULE | Freq: Four times a day (QID) | ORAL | Status: DC
  Administered 2021-02-14 – 2021-02-15 (×5): 900 mg via ORAL
  Filled 2021-02-14 (×5): qty 3

## 2021-02-14 MED ORDER — SULFAMETHOXAZOLE-TRIMETHOPRIM 800-160 MG PO TABS: 1.0000 | ORAL_TABLET | ORAL | Status: DC

## 2021-02-14 MED ORDER — FOLIC ACID 1 MG PO TABS
1.0000 mg | ORAL_TABLET | Freq: Every day | ORAL | Status: DC
  Administered 2021-02-15: 1 mg via ORAL
  Filled 2021-02-14: qty 1

## 2021-02-14 MED ORDER — SULFAMETHOXAZOLE-TRIMETHOPRIM 800-160 MG PO TABS
1.0000 | ORAL_TABLET | ORAL | Status: DC
  Administered 2021-02-15: 1 via ORAL
  Filled 2021-02-14: qty 1

## 2021-02-14 MED ORDER — ADULT MULTIVITAMIN W/MINERALS CH
1.0000 | ORAL_TABLET | Freq: Every day | ORAL | Status: DC
  Administered 2021-02-14: 1 via ORAL
  Filled 2021-02-14: qty 1

## 2021-02-14 MED ORDER — THIAMINE HCL 100 MG PO TABS
100.0000 mg | ORAL_TABLET | Freq: Every day | ORAL | Status: DC
  Administered 2021-02-14: 100 mg via ORAL
  Filled 2021-02-14: qty 1

## 2021-02-14 MED ORDER — VITAMIN B-12 1000 MCG PO TABS
1000.0000 ug | ORAL_TABLET | Freq: Every day | ORAL | Status: DC
  Administered 2021-02-15: 1000 ug via ORAL
  Filled 2021-02-14 (×2): qty 1

## 2021-02-14 MED ORDER — FLUCONAZOLE 200 MG PO TABS
200.0000 mg | ORAL_TABLET | Freq: Every day | ORAL | Status: DC
  Administered 2021-02-15: 200 mg via ORAL
  Filled 2021-02-14 (×2): qty 1

## 2021-02-14 NOTE — ED Notes (Signed)
Patient agrees to take medication from our pharmacy instead of his own personal supply.

## 2021-02-14 NOTE — Progress Notes (Signed)
TOC CM contacted [facility] SNF to follow up on placement. Left message for return call. Faxed pt's progress note, FL2 and PT evaluation to [facility] for SNF auth. Contacted [facility] and left message for admission coordinator, Rashema. Will contact [facility] to start auth for SNF at approved [facility]. [facility] does not accept [employer] and has not sent confirmation of acceptance.   Isidoro Donning RN CCM, [facility] ED TOC CM [phone]

## 2021-02-14 NOTE — Progress Notes (Signed)
Patient's medication was sent to pharmacy.

## 2021-02-14 NOTE — Progress Notes (Signed)
TOC CSW spoke with pt this morning in regards to the facility and reviews.  Pt had a few questions about financial disbursements to the facility.  CSW will give pt a call later today to update.  CSW will continue to follow for dc needs.  Tequlia Gonsalves Tarpley-Carter, MSW, LCSW-A Pronouns:  She, Her, Hers                  [facility] Transitions of CareClinical Social Worker [email or web address] [phone]

## 2021-02-15 ENCOUNTER — Other Ambulatory Visit (HOSPITAL_COMMUNITY): Payer: Self-pay

## 2021-02-15 MED ORDER — BICTEGRAVIR-EMTRICITAB-TENOFOV 50-200-25 MG PO TABS: ORAL_TABLET | ORAL | 11 refills | Status: DC

## 2021-02-15 MED ORDER — BICTEGRAVIR-EMTRICITAB-TENOFOV 50-200-25 MG PO TABS: 1.0000 | ORAL_TABLET | Freq: Every day | ORAL | 11 refills | Status: AC

## 2021-02-15 MED ORDER — BICTEGRAVIR-EMTRICITAB-TENOFOV 50-200-25 MG PO TABS
ORAL_TABLET | ORAL | 11 refills | Status: AC
  Filled 2021-02-15: qty 30, 30d supply, fill #0

## 2021-02-15 NOTE — ED Provider Notes (Signed)
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  He is being placed in skilled nursing facility.  His Biktarvy has been sent to the [facility] and social work will pick it up to go to skilled nursing facility with him. Patient discharged to [facility].   Margarita Grizzle, MD 02/15/21 [phone]

## 2021-02-15 NOTE — Progress Notes (Signed)
330 pm Pt will need a RW, contacted [employer] for RW. Faxed clinicals to [facility] Medicaid # [id number] ref# [id number] per facility request. TOC CM spoke to [facility] outpt pharmacy Specialty pharmacist [phone] and they have been managing pt's medications. And will mail refills of Biktarvy to [employer]. TOC CM delivered RX for medication to pharmacy with 11 refills. Medication is paid from his Medicaid. Updated [employer], rep Antionette. [facility] Waiver for Safe [facility] faxed to transportation. Will contact [employer] when pt receives walker. Isidoro Donning RN CCM, [facility] TOC CM [phone]  1:00 pm  TOC CM received call from [facility] accepting pt for SNF rehab. Requested pt come with Biktarvy medication as it is to expensive for their pharmacy to cover. Pt has medication and will transport to SNF with medications. Isidoro Donning RN CCM, [facility] TOC CM [phone]

## 2021-02-15 NOTE — Discharge Instructions (Signed)
Biktarvy prescription sent to [facility]

## 2021-02-15 NOTE — Progress Notes (Signed)
TOC CSW has been reaching out to [employer]. In [place], [place].  Pts Child psychotherapist at [place] is [employer] [phone], ext. [phone].    CSW will continue to follow for dc needs.  Joelee Snoke Tarpley-Carter, MSW, LCSW-A Pronouns:  She, Her, Hers                  [facility] Transitions of CareClinical Social Worker [email or web address] [phone]

## 2021-02-15 NOTE — Progress Notes (Signed)
TOC CSWattempted to contactTravis Mcgee/Travis Mcgee [phone].CSW left HIPPA compliant message with my contact information.  Travis Mcgee was reviewing pts insurance.  CSW called for an update.  CSW will continue to follow for dc needs.  Travis Mcgee, MSW, LCSW-A Pronouns:  She, Her, Hers                  [place] ED Transitions of CareClinical Social Worker [email or web address] [phone] 

## 2021-02-15 NOTE — ED Notes (Signed)
Pt ambulated around department with walker.

## 2021-02-21 ENCOUNTER — Other Ambulatory Visit (HOSPITAL_COMMUNITY): Payer: Self-pay

## 2021-02-21 LAB — VITAMIN E
Vitamin E (Alpha Tocopherol): 9.7 mg/L (ref 7.0–25.1)
Vitamin E(Gamma Tocopherol): 2 mg/L (ref 0.5–5.5)

## 2021-03-06 ENCOUNTER — Other Ambulatory Visit (HOSPITAL_COMMUNITY): Payer: Self-pay

## 2021-03-11 ENCOUNTER — Other Ambulatory Visit (HOSPITAL_COMMUNITY): Payer: Self-pay

## 2021-03-13 ENCOUNTER — Other Ambulatory Visit (HOSPITAL_COMMUNITY): Payer: Self-pay

## 2021-03-15 ENCOUNTER — Other Ambulatory Visit (HOSPITAL_COMMUNITY): Payer: Self-pay

## 2021-03-19 ENCOUNTER — Other Ambulatory Visit (HOSPITAL_COMMUNITY): Payer: Self-pay

## 2021-03-23 LAB — ACID FAST CULTURE WITH REFLEXED SENSITIVITIES (MYCOBACTERIA): Acid Fast Culture: NEGATIVE

## 2021-05-23 ENCOUNTER — Other Ambulatory Visit (HOSPITAL_COMMUNITY): Payer: Self-pay

## 2021-07-02 ENCOUNTER — Other Ambulatory Visit (HOSPITAL_COMMUNITY): Payer: Self-pay

## 2023-02-10 ENCOUNTER — Encounter: Payer: Self-pay | Admitting: *Deleted
